# Patient Record
Sex: Female | Born: 1960 | Race: Black or African American | Hispanic: No | Marital: Single | Smoking: Never smoker
Health system: Southern US, Community
[De-identification: ages and names within clinical notes are randomized; demographics above are authoritative.]

## PROBLEM LIST (undated history)

## (undated) DIAGNOSIS — T8859XA Other complications of anesthesia, initial encounter: Secondary | ICD-10-CM

## (undated) DIAGNOSIS — J302 Other seasonal allergic rhinitis: Secondary | ICD-10-CM

## (undated) DIAGNOSIS — Z8719 Personal history of other diseases of the digestive system: Secondary | ICD-10-CM

## (undated) DIAGNOSIS — T4145XA Adverse effect of unspecified anesthetic, initial encounter: Secondary | ICD-10-CM

## (undated) DIAGNOSIS — R011 Cardiac murmur, unspecified: Secondary | ICD-10-CM

## (undated) DIAGNOSIS — J069 Acute upper respiratory infection, unspecified: Secondary | ICD-10-CM

## (undated) DIAGNOSIS — F419 Anxiety disorder, unspecified: Secondary | ICD-10-CM

## (undated) DIAGNOSIS — R197 Diarrhea, unspecified: Secondary | ICD-10-CM

## (undated) DIAGNOSIS — E119 Type 2 diabetes mellitus without complications: Secondary | ICD-10-CM

## (undated) DIAGNOSIS — M4800 Spinal stenosis, site unspecified: Secondary | ICD-10-CM

## (undated) DIAGNOSIS — M1990 Unspecified osteoarthritis, unspecified site: Secondary | ICD-10-CM

## (undated) DIAGNOSIS — J45909 Unspecified asthma, uncomplicated: Secondary | ICD-10-CM

## (undated) DIAGNOSIS — I10 Essential (primary) hypertension: Secondary | ICD-10-CM

## (undated) HISTORY — DX: Other seasonal allergic rhinitis: J30.2

## (undated) HISTORY — DX: Other complications of anesthesia, initial encounter: T88.59XA

## (undated) HISTORY — DX: Personal history of other diseases of the digestive system: Z87.19

## (undated) HISTORY — DX: Acute upper respiratory infection, unspecified: J06.9

## (undated) HISTORY — DX: Spinal stenosis, site unspecified: M48.00

## (undated) HISTORY — DX: Adverse effect of unspecified anesthetic, initial encounter: T41.45XA

## (undated) HISTORY — DX: Diarrhea, unspecified: R19.7

## (undated) HISTORY — DX: Morbid (severe) obesity due to excess calories: E66.01

## (undated) HISTORY — PX: WISDOM TOOTH EXTRACTION: SHX21

## (undated) HISTORY — PX: COLONOSCOPY: SHX174

## (undated) HISTORY — DX: Type 2 diabetes mellitus without complications: E11.9

## (undated) HISTORY — DX: Unspecified asthma, uncomplicated: J45.909

## (undated) HISTORY — DX: Essential (primary) hypertension: I10

---

## 1998-10-13 ENCOUNTER — Other Ambulatory Visit: Admission: RE | Admit: 1998-10-13 | Discharge: 1998-10-13 | Payer: Self-pay | Admitting: Gynecology

## 1999-06-14 ENCOUNTER — Encounter: Payer: Self-pay | Admitting: Gynecology

## 1999-06-14 ENCOUNTER — Ambulatory Visit (HOSPITAL_COMMUNITY): Admission: RE | Admit: 1999-06-14 | Discharge: 1999-06-14 | Payer: Self-pay | Admitting: Gynecology

## 1999-11-09 ENCOUNTER — Other Ambulatory Visit: Admission: RE | Admit: 1999-11-09 | Discharge: 1999-11-09 | Payer: Self-pay | Admitting: Gynecology

## 2000-05-13 HISTORY — PX: OOPHORECTOMY: SHX86

## 2000-05-18 ENCOUNTER — Ambulatory Visit (HOSPITAL_COMMUNITY): Admission: RE | Admit: 2000-05-18 | Discharge: 2000-05-18 | Payer: Self-pay | Admitting: Urology

## 2000-05-18 ENCOUNTER — Encounter: Payer: Self-pay | Admitting: Urology

## 2000-05-29 ENCOUNTER — Emergency Department (HOSPITAL_COMMUNITY): Admission: EM | Admit: 2000-05-29 | Discharge: 2000-05-29 | Payer: Self-pay | Admitting: Emergency Medicine

## 2000-06-05 ENCOUNTER — Inpatient Hospital Stay (HOSPITAL_COMMUNITY): Admission: AD | Admit: 2000-06-05 | Discharge: 2000-06-08 | Payer: Self-pay | Admitting: Gynecology

## 2000-06-05 ENCOUNTER — Encounter (INDEPENDENT_AMBULATORY_CARE_PROVIDER_SITE_OTHER): Payer: Self-pay | Admitting: Specialist

## 2000-07-06 ENCOUNTER — Ambulatory Visit (HOSPITAL_COMMUNITY): Admission: RE | Admit: 2000-07-06 | Discharge: 2000-07-06 | Payer: Self-pay | Admitting: Urology

## 2000-07-06 ENCOUNTER — Encounter: Payer: Self-pay | Admitting: Urology

## 2000-08-30 ENCOUNTER — Encounter: Admission: RE | Admit: 2000-08-30 | Discharge: 2000-11-28 | Payer: Self-pay | Admitting: Family Medicine

## 2001-01-01 ENCOUNTER — Encounter: Admission: RE | Admit: 2001-01-01 | Discharge: 2001-04-01 | Payer: Self-pay | Admitting: Family Medicine

## 2001-01-14 ENCOUNTER — Encounter: Payer: Self-pay | Admitting: General Surgery

## 2001-01-14 ENCOUNTER — Emergency Department (HOSPITAL_COMMUNITY): Admission: EM | Admit: 2001-01-14 | Discharge: 2001-01-14 | Payer: Self-pay | Admitting: Emergency Medicine

## 2001-04-24 ENCOUNTER — Encounter: Admission: RE | Admit: 2001-04-24 | Discharge: 2001-07-23 | Payer: Self-pay | Admitting: Family Medicine

## 2001-06-18 ENCOUNTER — Other Ambulatory Visit: Admission: RE | Admit: 2001-06-18 | Discharge: 2001-06-18 | Payer: Self-pay | Admitting: Gynecology

## 2001-08-08 ENCOUNTER — Encounter: Admission: RE | Admit: 2001-08-08 | Discharge: 2001-11-06 | Payer: Self-pay | Admitting: Family Medicine

## 2001-12-13 ENCOUNTER — Encounter: Admission: RE | Admit: 2001-12-13 | Discharge: 2002-03-13 | Payer: Self-pay | Admitting: Family Medicine

## 2002-03-21 ENCOUNTER — Encounter: Admission: RE | Admit: 2002-03-21 | Discharge: 2002-06-19 | Payer: Self-pay | Admitting: Family Medicine

## 2003-07-24 ENCOUNTER — Other Ambulatory Visit: Admission: RE | Admit: 2003-07-24 | Discharge: 2003-07-24 | Payer: Self-pay | Admitting: Gynecology

## 2004-07-26 ENCOUNTER — Other Ambulatory Visit: Admission: RE | Admit: 2004-07-26 | Discharge: 2004-07-26 | Payer: Self-pay | Admitting: Gynecology

## 2005-08-08 ENCOUNTER — Other Ambulatory Visit: Admission: RE | Admit: 2005-08-08 | Discharge: 2005-08-08 | Payer: Self-pay | Admitting: Gynecology

## 2006-08-11 ENCOUNTER — Other Ambulatory Visit: Admission: RE | Admit: 2006-08-11 | Discharge: 2006-08-11 | Payer: Self-pay | Admitting: Obstetrics and Gynecology

## 2007-09-07 ENCOUNTER — Other Ambulatory Visit: Admission: RE | Admit: 2007-09-07 | Discharge: 2007-09-07 | Payer: Self-pay | Admitting: Obstetrics and Gynecology

## 2009-01-16 ENCOUNTER — Ambulatory Visit: Payer: Self-pay | Admitting: Women's Health

## 2009-03-10 ENCOUNTER — Ambulatory Visit (HOSPITAL_COMMUNITY): Admission: RE | Admit: 2009-03-10 | Discharge: 2009-03-10 | Payer: Self-pay | Admitting: Family Medicine

## 2009-06-25 ENCOUNTER — Ambulatory Visit: Payer: Self-pay | Admitting: Women's Health

## 2010-07-16 ENCOUNTER — Ambulatory Visit (HOSPITAL_COMMUNITY): Admission: RE | Admit: 2010-07-16 | Discharge: 2010-07-16 | Payer: Self-pay | Admitting: Family Medicine

## 2010-11-02 ENCOUNTER — Emergency Department (HOSPITAL_COMMUNITY)
Admission: EM | Admit: 2010-11-02 | Discharge: 2010-11-02 | Disposition: A | Discharge status: Home / Self Care | Payer: Self-pay | Attending: Emergency Medicine | Admitting: Emergency Medicine

## 2010-11-02 ENCOUNTER — Emergency Department (HOSPITAL_COMMUNITY): Payer: Self-pay

## 2010-11-02 DIAGNOSIS — IMO0002 Reserved for concepts with insufficient information to code with codable children: Secondary | ICD-10-CM | POA: Insufficient documentation

## 2010-11-02 DIAGNOSIS — S5000XA Contusion of unspecified elbow, initial encounter: Secondary | ICD-10-CM | POA: Insufficient documentation

## 2011-01-28 NOTE — Discharge Summary (Signed)
[facility]  Patient:    Amy Molina, Amy Molina                       MRN: [id number] Adm. Date:  [id number] Disc. Date: [id number] Attending:  Douglass Rivers Dictator:   Antony Contras, [facility]                           Discharge Summary  DISCHARGE DIAGNOSES:          1. Left lower quadrant pain.                               2. Left hydronephrosis.                               3. Left hydrosalpinx.                               4. Extensive pelvic adhesions.  PROCEDURES:                   Exploratory laparotomy, LSO, and lysis of adhesions.  HISTORY OF PRESENT ILLNESS:   The patient is a 50 year old, gravida 0, with one month of worsening left lower quadrant pain which at times has been incapacitating and has awakened her from sleep.  She has been evaluated for a possible urinary tract infection, kidney infection, stone.  This evaluation was negative, but did reveal a hydronephrosis on her left.  She also, per ultrasound, did have a left ovarian cyst of 3.7 cm noted.  She is admitted for exploratory surgery to rule out a possible left ovarian torsion.  HOSPITAL COURSE:              The patient was taken to the OR, where exploratory laparotomy, LSO, and lysis of adhesions was performed by Dr. Farrel Gobble, assisted by Dr. Penni Homans, under general anesthesia.  The findings included extensive dense left adnexal adhesions, marked induration, scarred ureter, normal uterus, right ovary consistent with PCO.  She also had a left hydrosalpinx.  POSTOPERATIVE COURSE:         The patient did develop a fever on her second postoperative day, up to 101.3.  Otherwise, she was voiding and had no other difficulties other than some hypoactive bowel sounds.  Her temperature did resolve by postoperative day #3, and she did begin to pass gas and had bowel movements prior to discharge.  She was able to be discharged on her second postoperative day, in satisfactory condition.   She  is to follow up in the office in two weeks.  Staples were removed and Steri-Strips applied. DD:  06/27/00 TD:  06/27/00 Job: [id number]

## 2011-01-28 NOTE — Op Note (Signed)
[facility]  Patient:    Amy Molina, Amy Molina                       MRN: [id number] Proc. Date: 06/05/00 Adm. Date:  [id number] Attending:  Douglass Rivers                           Operative Report  PREOPERATIVE DIAGNOSES:       1. Left lower quadrant pain.                               2. Left hydronephrosis.                               3. Possible left ovarian torsion.  POSTOPERATIVE DIAGNOSES:      1. Left lower quadrant pain.                               2. Left hydrosalpinx.                               3. Extensive pelvic adhesions.  OPERATION:                    1. Exploratory laparotomy.                               2. Left salpingo-oophorectomy.                               3. Lysis of adhesions.  SURGEON:                      Douglass Rivers, M.D.  ASSISTANT:                    Katy Fitch, M.D.  ANESTHESIA:                   General anesthesia.  IV FLUID:                     Two liters of lactated Ringers.  ESTIMATED BLOOD LOSS:         300 cc.  URINE OUTPUT:                 250 cc of clear urine.  FINDINGS:                     There were extensive dense left adnexal adhesions with marked induration of the left tube that was thickened hydrosalpinx, noncompressible.  Tube and ovary complex were markedly adherent to the underlying bowel and sidewall and presumable the ureter. The ureter was identified and noted to be thickened and indurated. The uterus was otherwise normal. The right tube was also normal.  The right ovary was consistent with polycystic ovary.  DESCRIPTION OF PROCEDURE:     The patient was taken to the operating room and placed in the supine position.  General anesthesia was induced and she was prepped and draped in the usual sterile fashion.  She was noted to have  a marked pannus and towel clamps were placed and held on tension in order to elevate the pannus.  A Pfannenstiel skin incision was then made with  the scalpel and carried through the underlying layer with electrocautery. The fascia was scored in the midline and the incision was extended laterally with cautery with careful attention of traversing blood vessels. The fascia was then dissected off the underlying rectus muscles by blunt and sharp dissection, again with careful attention to the traversing vessels.  The rectus muscles were separated in the midline. The peritoneum was entered bluntly. The peritoneal incision was then extended superiorly and inferiorly. Good visualization of underlying bowel and bladder.  The pelvis was inspected.  The right tube and ovary were noted to be mobile. The left was markedly adherent. We, therefore, knew we were not dealing with an issue of torsion. The bowels were packed away with moist lap packs.  A Balfour retractor was then placed in the incision with long blade with careful attention to the underlying psoas muscles.  An extender was placed in order to retract the bowels out of the field.  The tubo-ovarian complex was immediately noted and was noted to be markedly adherent to the sidewall and the bowel.  It was dissected off sharply with careful attention to the bowel.  The ovary was densely scarred and difficult to mobilize.  Knowing that the patient had a hydronephrosis on the left, I then decided that the left ovary would probable also need to be sacrificed. The left adnexa was grasped in the [facility] clamp. The round ligament was identified, transected and suture ligated.  The retroperitoneal space was then entered with an attempt to identify the ureter. Despite multiple attempts, we were not able initially at this point to dissect the ureter.  We then went back to sharp and blunt dissection of the tube off the underlying bowel and sidewall.  What appeared to be infundibulopelvic ligament was identified but again the ureter had not been seen and the anatomy was somewhat distorted.  The  posterior leaf of the broad ligament was transected and suture ligated. The pedicle on the uterus was sutured with Heaney stitch of 0 Vicryl. It was noted to be hemostatic.  The ovary was able to be dissected off the tubal complex proximally and this was done in order to help identify the infundibulopelvic ligament.  As we were unable at this point to identify the ureter, the dissection was carried through as close to the tube as possible.  The peritoneum was incised and the vessels were individually identified, clamped and suture ligated with 0 Vicryl until the entire pedicle was identified.  We were eventually able to dissect the mass off the sidewall and colon by blunt and sharp dissection with careful attention to any bleeders.  Once the mass was removed from the abdomen, we then continued to search for the ureter and eventually it was identified and noted to be free of our infundibulopelvic ligament and it was traced to the uterus and was noted to be on the medial aspect of the peritoneum.  The area was again freed up as it had been taken down separately and the peritoneum had been dissected away.  There were small amounts of bleeding. A Kelly clamp was placed underneath this portion and the pedicle was trimmed and a free tie of Vicryl was placed and a suture ligature to follow.  It was noted to be hemostatic at this point.  The pelvis was irrigated with copious  amounts of warm saline.  Hemostasis was assured. We again inspected the ureter.  There was noted to be dense peritoneum over the ureter and this was partially freed up and was noted to be hemostatic.  The instruments were then removed from the abdomen as were the lap packs. The peritoneum and muscle were inspected and treated appropriately with electrocautery. The fascia was then closed with 0 Vicryl starting at the apex and moving toward the midline bilaterally. The subcutaneous area was irrigated and the skin was closed with  staples.  The patient tolerated the procedure well. Sponge, lap and needle counts correct x 2.  She was extubated in the OR and transferred to the PACU in stable condition. DD:  06/05/00 TD:  06/06/00 Job: [id number]

## 2011-07-14 ENCOUNTER — Telehealth: Payer: Self-pay

## 2011-07-14 NOTE — Telephone Encounter (Signed)
Patient requested Diflucan for yeast infection.  I called her back and told her she had not been seen at [facility] in two years and we could not continue to fill RX's without office visit.  Patient said she had lost job and has been going to free pap clinic and went to that back in the Spring in [place].  I asked her to see if she can get her pap results sent to [place].  She said she has been seeing her diabetes doctor but did not feel comfortable seeing him for vaginal yeast infection.  I told her I would ask Harriett Sine.

## 2011-07-14 NOTE — Telephone Encounter (Signed)
Telephone call to [facility]. Reviewed importance of an office visit. She has not been in our office for 2 years. Will schedule

## 2012-02-16 ENCOUNTER — Encounter: Payer: Self-pay | Admitting: Women's Health

## 2012-03-19 ENCOUNTER — Ambulatory Visit (INDEPENDENT_AMBULATORY_CARE_PROVIDER_SITE_OTHER): Payer: 59 | Admitting: Women's Health

## 2012-03-19 ENCOUNTER — Encounter: Payer: Self-pay | Admitting: Women's Health

## 2012-03-19 ENCOUNTER — Other Ambulatory Visit (HOSPITAL_COMMUNITY)
Admission: RE | Admit: 2012-03-19 | Discharge: 2012-03-19 | Disposition: A | Discharge status: Home / Self Care | Payer: 59 | Source: Ambulatory Visit | Attending: Obstetrics and Gynecology | Admitting: Obstetrics and Gynecology

## 2012-03-19 VITALS — BP 120/78 | Ht 65.75 in | Wt 308.0 lb

## 2012-03-19 DIAGNOSIS — I10 Essential (primary) hypertension: Secondary | ICD-10-CM | POA: Insufficient documentation

## 2012-03-19 DIAGNOSIS — E669 Obesity, unspecified: Secondary | ICD-10-CM

## 2012-03-19 DIAGNOSIS — N898 Other specified noninflammatory disorders of vagina: Secondary | ICD-10-CM

## 2012-03-19 DIAGNOSIS — Z01419 Encounter for gynecological examination (general) (routine) without abnormal findings: Secondary | ICD-10-CM

## 2012-03-19 DIAGNOSIS — E119 Type 2 diabetes mellitus without complications: Secondary | ICD-10-CM | POA: Insufficient documentation

## 2012-03-19 LAB — WET PREP FOR TRICH, YEAST, CLUE

## 2012-03-19 MED ORDER — METRONIDAZOLE 0.75 % VA GEL: VAGINAL | Status: AC

## 2012-03-19 MED ORDER — FLUCONAZOLE 100 MG PO TABS: 100.0000 mg | ORAL_TABLET | Freq: Every day | ORAL | Status: AC

## 2012-03-19 MED ORDER — ALPRAZOLAM 0.25 MG PO TABS: 0.2500 mg | ORAL_TABLET | Freq: Every evening | ORAL | Status: AC | PRN

## 2012-03-19 NOTE — Patient Instructions (Signed)

## 2012-03-19 NOTE — Progress Notes (Signed)
Amy Molina Jan 22, 1961 [id number]    History:    The patient presents for annual exam. Cycles every 3 months for past 3 years/5-7 days/same partner. Mammogram and Paps normal, both died in 02/09/2010.  Reports night sweats/hot flashes for past year/vaginal dryness for past 4 months. Complains of recent vaginal itching with no discharge or odor/frequent urination with no dysuria or hematuria that she associates with diuretic.   Past medical history, past surgical history, family history and social history were all reviewed and documented in the EPIC chart. Hx HTN and Type 2 DM-managed by [facility]. Oophorectomy 2001 benign cyst. New job at [employer], upcoming travel to [place].    ROS:  A  ROS was performed and pertinent positives and negatives are included in the history.  Exam:  Filed Vitals:   03/19/12 1015  BP: 120/78    General appearance:  Morbid obesity  Head/Neck:  Normal, without cervical or supraclavicular adenopathy. Thyroid:  Symmetrical, normal in size, without palpable masses or nodularity. Respiratory  Effort:  Normal  Auscultation:  Clear without wheezing or rhonchi Cardiovascular  Auscultation:  Regular rate, without rubs, murmurs or gallops  Edema/varicosities:  Both feet edematous  Abdominal  Soft,nontender, without masses, guarding or rebound.  Liver/spleen:  No organomegaly noted  Hernia:  None appreciated  Skin  Inspection:  Grossly normal  Palpation:  Grossly normal Neurologic/psychiatric  Orientation:  Normal with appropriate conversation.  Mood/affect:  Normal  Genitourinary    Breasts: Examined lying and sitting/pendulous.     Right: Without masses, retractions, discharge or axillary adenopathy.     Left: Without masses, retractions, discharge or axillary adenopathy.   Inguinal/mons:  Normal without inguinal adenopathy  External genitalia:  Erythematous.  BUS/Urethra/Skene's glands:  Normal  Bladder:  Normal  Vagina:  Normal. Wet prep positive for amines, clue  cells, and bacteria, negative for yeast.  Cervix:  Normal  Uterus:  Normal in size, shape and contour.  Midline and mobile  Adnexa/parametria:     Rt: Without masses or tenderness.   Lt: Without masses or tenderness.  Anus and perineum: Normal  Digital rectal exam: Normal sphincter tone without palpated masses or tenderness  Assessment/Plan:  50 y.o. SBF G1P0 for annual exam with complaints of vaginal itching, irregular cycles for past few years.    BV Irregular cycles/every 3 months for past 3 years HTN/Type 2 DM-meds/labs per primary care Morbid Obesity  Plan:  Metrogel vaginal 1 applicator HX X 5, prescription and use reviewed. Diflucan 100 mg one tablet as needed aware to use sparingly.  dispense 30 tablets, no refills prescription and use reviewed/Yeast prevention reviewed. Xanax 0.25 mg daily prn, dispense 30 tablets, prescription as needed for upcoming flight to [place], instructed to keep in safe place and use sparingly. Mammogram overdue, instructed to schedule. UA, Pap done, new screening guidelines reviewed. Ultrasound for irregular cycles and limited exam due to morbid obesity, instructed to schedule, and to notify if no cycle for greater than 3 months. SBE's, Ca rich and heart healthy diet, daily MVI, exercise and cutting calories for weight loss, and yearly mammograms encouraged. Discussed menopause symptoms and definition.    Harrington Challenger WHNP, 11:13 AM 03/19/2012

## 2012-03-20 LAB — URINALYSIS W MICROSCOPIC + REFLEX CULTURE
Bacteria, UA: NONE SEEN
Casts: NONE SEEN
Crystals: NONE SEEN
Ketones, ur: NEGATIVE mg/dL
Leukocytes, UA: NEGATIVE
Nitrite: NEGATIVE
Specific Gravity, Urine: 1.017 (ref 1.005–1.030)
Squamous Epithelial / LPF: NONE SEEN
Urobilinogen, UA: 0.2 mg/dL (ref 0.0–1.0)
pH: 6 (ref 5.0–8.0)

## 2012-04-13 ENCOUNTER — Other Ambulatory Visit: Payer: 59

## 2012-04-13 ENCOUNTER — Ambulatory Visit: Payer: 59 | Admitting: Women's Health

## 2012-06-29 ENCOUNTER — Encounter: Payer: Self-pay | Admitting: Women's Health

## 2013-01-01 ENCOUNTER — Ambulatory Visit (INDEPENDENT_AMBULATORY_CARE_PROVIDER_SITE_OTHER): Payer: 59 | Admitting: Family Medicine

## 2013-01-01 ENCOUNTER — Encounter: Payer: Self-pay | Admitting: Family Medicine

## 2013-01-01 VITALS — BP 122/88 | HR 80 | Temp 97.8°F | Resp 18 | Ht 65.5 in | Wt 313.0 lb

## 2013-01-01 DIAGNOSIS — F411 Generalized anxiety disorder: Secondary | ICD-10-CM

## 2013-01-01 DIAGNOSIS — I10 Essential (primary) hypertension: Secondary | ICD-10-CM

## 2013-01-01 DIAGNOSIS — E119 Type 2 diabetes mellitus without complications: Secondary | ICD-10-CM

## 2013-01-01 LAB — HEPATIC FUNCTION PANEL
ALT: 18 U/L (ref 0–35)
AST: 17 U/L (ref 0–37)
Albumin: 3.6 g/dL (ref 3.5–5.2)
Alkaline Phosphatase: 77 U/L (ref 39–117)
Total Protein: 6.8 g/dL (ref 6.0–8.3)

## 2013-01-01 LAB — BASIC METABOLIC PANEL
BUN: 13 mg/dL (ref 6–23)
CO2: 28 mEq/L (ref 19–32)
Calcium: 9.4 mg/dL (ref 8.4–10.5)
Chloride: 105 mEq/L (ref 96–112)
Creat: 0.59 mg/dL (ref 0.50–1.10)
Glucose, Bld: 156 mg/dL — ABNORMAL HIGH (ref 70–99)

## 2013-01-01 LAB — LIPID PANEL
Cholesterol: 163 mg/dL (ref 0–200)
Total CHOL/HDL Ratio: 3.6 Ratio

## 2013-01-01 MED ORDER — LOSARTAN POTASSIUM 50 MG PO TABS: 50.0000 mg | ORAL_TABLET | Freq: Every day | ORAL | Status: DC

## 2013-01-01 MED ORDER — ALPRAZOLAM 0.5 MG PO TABS: 0.5000 mg | ORAL_TABLET | Freq: Three times a day (TID) | ORAL | Status: DC | PRN

## 2013-01-01 NOTE — Progress Notes (Signed)
  Subjective:    Patient ID: Amy Molina, female    DOB: 02-28-1961, 52 y.o.   MRN: [id number]  HPI  Patient is here for followup of her diabetes. She is turned currently taking glyburide 10 mg by mouth daily and metformin 1000 mg by mouth every morning. She did not increase metformin to twice a day due to diarrhea. She reports fasting blood sugars between 101 120. She denies hypoglycemia. She does report a constant daily call for the last 4 months. She is taking lisinopril 20 mg by mouth daily for blood pressure. She has no chest pain or shortness of breath. She does report occasional wheezing. She's also had problems with episodic anxiety. These include panic attacks. She denies that she needs something on a daily basis for depression. She would like something that she could take sparingly as needed just for anxiety. Past Medical History  Diagnosis Date  . Diabetes mellitus   . Hypertension    Current Outpatient Prescriptions on File Prior to Visit  Medication Sig Dispense Refill  . GLYBURIDE PO Take 10 mg by mouth daily.       Marland Kitchen METFORMIN HCL PO Take 1,000 mg by mouth daily.       . naproxen sodium (ANAPROX) 220 MG tablet Take 220 mg by mouth 2 (two) times daily with a meal. 2 TO 3 PILLS PER DAY       No current facility-administered medications on file prior to visit.   Allergies  Allergen Reactions  . Floxin (Ofloxacin)     MIGRAINES, INSOMNIA, PARANOIND, RINGING IN EARS  . Lipitor (Atorvastatin)     Severe myalgias  . Penicillins      Review of Systems Review of systems is otherwise negative    Objective:   Physical Exam  Neck: Normal range of motion. Neck supple. No thyromegaly present.  Cardiovascular: Normal rate, regular rhythm, normal heart sounds and intact distal pulses.  Exam reveals no gallop.   No murmur heard. Pulmonary/Chest: Effort normal and breath sounds normal. No respiratory distress. She has no wheezes. She has no rales. She exhibits no tenderness.   Abdominal: Soft. Bowel sounds are normal. She exhibits no distension and no mass. There is no tenderness. There is no rebound and no guarding.  Lymphadenopathy:    She has no cervical adenopathy.   patient is obese. There is +1 edema to the shins bilaterally.        Assessment & Plan:  1. Diabetes mellitus type 2, controlled His hemoglobin A1c is under control, I would recommend discontinuing glyburide, and increasing metformin to 1000 twice a day.  I will check her lipid panel. Her goal LDL is less than 100 - Basic Metabolic Panel - Hemoglobin A1c - Hepatic Function Panel - Lipid Panel  2. HTN (hypertension) Blood pressure is currently controlled. Discontinue lisinopril due to cough. Begin losartan 50 mg by mouth daily  3. Generalized anxiety disorder I added Xanax 0.5 mg by mouth every 8 hours when necessary anxiety. I recommended her to use this medication sparingly due to the risk of habituation.

## 2013-01-07 ENCOUNTER — Other Ambulatory Visit: Payer: Self-pay | Admitting: Family Medicine

## 2013-01-07 MED ORDER — METFORMIN HCL 1000 MG PO TABS: 1000.0000 mg | ORAL_TABLET | Freq: Two times a day (BID) | ORAL | Status: DC

## 2013-01-07 NOTE — Telephone Encounter (Signed)
rx per protocol

## 2013-04-01 ENCOUNTER — Other Ambulatory Visit: Payer: Self-pay | Admitting: Family Medicine

## 2013-04-02 NOTE — Telephone Encounter (Signed)
Med refill

## 2013-07-10 ENCOUNTER — Encounter: Payer: Self-pay | Admitting: Women's Health

## 2013-09-04 ENCOUNTER — Ambulatory Visit (INDEPENDENT_AMBULATORY_CARE_PROVIDER_SITE_OTHER): Payer: 59 | Admitting: Women's Health

## 2013-09-04 ENCOUNTER — Encounter: Payer: Self-pay | Admitting: Women's Health

## 2013-09-04 VITALS — BP 115/75 | Ht 66.0 in | Wt 309.4 lb

## 2013-09-04 DIAGNOSIS — Z01419 Encounter for gynecological examination (general) (routine) without abnormal findings: Secondary | ICD-10-CM

## 2013-09-04 DIAGNOSIS — F411 Generalized anxiety disorder: Secondary | ICD-10-CM

## 2013-09-04 MED ORDER — ALPRAZOLAM 0.5 MG PO TABS: 0.5000 mg | ORAL_TABLET | Freq: Three times a day (TID) | ORAL | Status: DC | PRN

## 2013-09-04 NOTE — Patient Instructions (Signed)
Health Recommendations for Postmenopausal Women Respected and ongoing research has looked at the most common causes of death, disability, and poor quality of life in postmenopausal women. The causes include heart disease, diseases of blood vessels, diabetes, depression, cancer, and bone loss (osteoporosis). Many things can be done to help lower the chances of developing these and other common problems: CARDIOVASCULAR DISEASE Heart Disease: A heart attack is a medical emergency. Know the signs and symptoms of a heart attack. Below are things women can do to reduce their risk for heart disease.   Do not smoke. If you smoke, quit.  Aim for a healthy weight. Being overweight causes many preventable deaths. Eat a healthy and balanced diet and drink an adequate amount of liquids.  Get moving. Make a commitment to be more physically active. Aim for 30 minutes of activity on most, if not all days of the week.  Eat for heart health. Choose a diet that is low in saturated fat and cholesterol and eliminate trans fat. Include whole grains, vegetables, and fruits. Read and understand the labels on food containers before buying.  Know your numbers. Ask your caregiver to check your blood pressure, cholesterol (total, HDL, LDL, triglycerides) and blood glucose. Work with your caregiver on improving your entire clinical picture.  High blood pressure. Limit or stop your table salt intake (try salt substitute and food seasonings). Avoid salty foods and drinks. Read labels on food containers before buying. Eating well and exercising can help control high blood pressure. STROKE  Stroke is a medical emergency. Stroke may be the result of a blood clot in a blood vessel in the brain or by a brain hemorrhage (bleeding). Know the signs and symptoms of a stroke. To lower the risk of developing a stroke:  Avoid fatty foods.  Quit smoking.  Control your diabetes, blood pressure, and irregular heart rate. THROMBOPHLEBITIS  (BLOOD CLOT) OF THE LEG  Becoming overweight and leading a stationary lifestyle may also contribute to developing blood clots. Controlling your diet and exercising will help lower the risk of developing blood clots. CANCER SCREENING  Breast Cancer: Take steps to reduce your risk of breast cancer.  You should practice "breast self-awareness." This means understanding the normal appearance and feel of your breasts and should include breast self-examination. Any changes detected, no matter how small, should be reported to your caregiver.  After age 40, you should have a clinical breast exam (CBE) every year.  Starting at age 40, you should consider having a mammogram (breast X-ray) every year.  If you have a family history of breast cancer, talk to your caregiver about genetic screening.  If you are at high risk for breast cancer, talk to your caregiver about having an MRI and a mammogram every year.  Intestinal or Stomach Cancer: Tests to consider are a rectal exam, fecal occult blood, sigmoidoscopy, and colonoscopy. Women who are high risk may need to be screened at an earlier age and more often.  Cervical Cancer:  Beginning at age 30, you should have a Pap test every 3 years as long as the past 3 Pap tests have been normal.  If you have had past treatment for cervical cancer or a condition that could lead to cancer, you need Pap tests and screening for cancer for at least 20 years after your treatment.  If you had a hysterectomy for a problem that was not cancer or a condition that could lead to cancer, then you no longer need Pap tests.    If you are between ages 65 and 70, and you have had normal Pap tests going back 10 years, you no longer need Pap tests.  If Pap tests have been discontinued, risk factors (such as a new sexual partner) need to be reassessed to determine if screening should be resumed.  Some medical problems can increase the chance of getting cervical cancer. In these  cases, your caregiver may recommend more frequent screening and Pap tests.  Uterine Cancer: If you have vaginal bleeding after reaching menopause, you should notify your caregiver.  Ovarian cancer: Other than yearly pelvic exams, there are no reliable tests available to screen for ovarian cancer at this time except for yearly pelvic exams.  Lung Cancer: Yearly chest X-rays can detect lung cancer and should be done on high risk women, such as cigarette smokers and women with chronic lung disease (emphysema).  Skin Cancer: A complete body skin exam should be done at your yearly examination. Avoid overexposure to the sun and ultraviolet light lamps. Use a strong sun block cream when in the sun. All of these things are important in lowering the risk of skin cancer. MENOPAUSE Menopause Symptoms: Hormone therapy products are effective for treating symptoms associated with menopause:  Moderate to severe hot flashes.  Night sweats.  Mood swings.  Headaches.  Tiredness.  Loss of sex drive.  Insomnia.  Other symptoms. Hormone replacement carries certain risks, especially in older women. Women who use or are thinking about using estrogen or estrogen with progestin treatments should discuss that with their caregiver. Your caregiver will help you understand the benefits and risks. The ideal dose of hormone replacement therapy is not known. The Food and Drug Administration (FDA) has concluded that hormone therapy should be used only at the lowest doses and for the shortest amount of time to reach treatment goals.  OSTEOPOROSIS Protecting Against Bone Loss and Preventing Fracture: If you use hormone therapy for prevention of bone loss (osteoporosis), the risks for bone loss must outweigh the risk of the therapy. Ask your caregiver about other medications known to be safe and effective for preventing bone loss and fractures. To guard against bone loss or fractures, the following is recommended:  If  you are less than age 50, take 1000 mg of calcium and at least 600 mg of Vitamin D per day.  If you are greater than age 50 but less than age 70, take 1200 mg of calcium and at least 600 mg of Vitamin D per day.  If you are greater than age 70, take 1200 mg of calcium and at least 800 mg of Vitamin D per day. Smoking and excessive alcohol intake increases the risk of osteoporosis. Eat foods rich in calcium and vitamin D and do weight bearing exercises several times a week as your caregiver suggests. DIABETES Diabetes Melitus: If you have Type I or Type 2 diabetes, you should keep your blood sugar under control with diet, exercise and recommended medication. Avoid too many sweets, starchy and fatty foods. Being overweight can make control more difficult. COGNITION AND MEMORY Cognition and Memory: Menopausal hormone therapy is not recommended for the prevention of cognitive disorders such as Alzheimer's disease or memory loss.  DEPRESSION  Depression may occur at any age, but is common in elderly women. The reasons may be because of physical, medical, social (loneliness), or financial problems and needs. If you are experiencing depression because of medical problems and control of symptoms, talk to your caregiver about this. Physical activity and   exercise may help with mood and sleep. Community and volunteer involvement may help your sense of value and worth. If you have depression and you feel that the problem is getting worse or becoming severe, talk to your caregiver about treatment options that are best for you. ACCIDENTS  Accidents are common and can be serious in the elderly woman. Prepare your house to prevent accidents. Eliminate throw rugs, place hand bars in the bath, shower and toilet areas. Avoid wearing high heeled shoes or walking on wet, snowy, and icy areas. Limit or stop driving if you have vision or hearing problems, or you feel you are unsteady with you movements and  reflexes. HEPATITIS C Hepatitis C is a type of viral infection affecting the liver. It is spread mainly through contact with blood from an infected person. It can be treated, but if left untreated, it can lead to severe liver damage over years. Many people who are infected do not know that the virus is in their blood. If you are a "baby-boomer", it is recommended that you have one screening test for Hepatitis C. IMMUNIZATIONS  Several immunizations are important to consider having during your senior years, including:   Tetanus, diptheria, and pertussis booster shot.  Influenza every year before the flu season begins.  Pneumonia vaccine.  Shingles vaccine.  Others as indicated based on your specific needs. Talk to your caregiver about these. Document Released: 10/21/2005 Document Revised: 08/15/2012 Document Reviewed: 06/16/2008 [employer] Patient Information 2014 [employer].  

## 2013-09-04 NOTE — Progress Notes (Signed)
Amy Molina January 05, 1961 [id number]    History:    The patient presents for annual exam.  Cycles every 30-45 days. LSO 2001 for benign cyst. Same partner/condoms. Normal Pap and mammogram history. History of fibroids/asymptomatic. Hypertension/diabetes primary care manages.    Past medical history, past surgical history, family history and social history were all reviewed and documented in the EPIC chart. Works at [employer], on IT sales professional. Father hypertension heart disease. Sister diabetes. Mother heart disease.  ROS:  A  ROS was performed and pertinent positives and negatives are included in the history.  Exam:  Filed Vitals:   09/04/13 1037  BP: 115/75    General appearance:  Normal Head/Neck:  Normal, without cervical or supraclavicular adenopathy. Thyroid:  Symmetrical, normal in size, without palpable masses or nodularity. Respiratory  Effort:  Normal  Auscultation:  Clear without wheezing or rhonchi Cardiovascular  Auscultation:  Regular rate, without rubs, murmurs or gallops  Edema/varicosities:  Not grossly evident Abdominal  Soft,nontender, without masses, guarding or rebound.  Liver/spleen:  No organomegaly noted  Hernia:  None appreciated  Skin  Inspection:  Grossly normal  Palpation:  Grossly normal Neurologic/psychiatric  Orientation:  Normal with appropriate conversation.  Mood/affect:  Normal  Genitourinary    Breasts: Examined lying and sitting.     Right: Without masses, retractions, discharge or axillary adenopathy.     Left: Without masses, retractions, discharge or axillary adenopathy.   Inguinal/mons:  Normal without inguinal adenopathy  External genitalia:  Normal  BUS/Urethra/Skene's glands:  Normal  Bladder:  Normal  Vagina:  Normal  Cervix:  Normal  Uterus:   normal in size, shape and contour.  Midline and mobile  Adnexa/parametria:     Rt: Without masses or tenderness.   Lt: Without masses or tenderness.  Anus and perineum: Normal  Digital  rectal exam: Normal sphincter tone without palpated masses or tenderness  Assessment/Plan:  52 y.o. SBF G0 for annual exam with occasional moodiness.  Hypertension/diabetes-primary care manages labs and meds Morbid obesity Fibroid uterus  Plan: Ultrasound to assess uterus/limited exam due to  obesity. SBE's, continue annual mammogram, calcium rich diet, vitamin D 2000 daily encouraged. Instructed to schedule screening colonoscopy. Increase regular exercise and decrease calories for weight loss. Pap normal 2013, new screening guidelines reviewed.    [facility], 12:14 PM 09/04/2013

## 2013-09-16 ENCOUNTER — Encounter: Payer: Self-pay | Admitting: Women's Health

## 2013-10-02 ENCOUNTER — Other Ambulatory Visit: Payer: Self-pay

## 2013-10-02 MED ORDER — FLUCONAZOLE 100 MG PO TABS: ORAL_TABLET | ORAL | Status: DC

## 2013-10-08 ENCOUNTER — Telehealth: Payer: Self-pay | Admitting: *Deleted

## 2013-10-08 NOTE — Telephone Encounter (Signed)
PA filled out and faxed back to [facility] for diflucan 100 mg # 30 prn as needed. Will wait for response.

## 2013-10-11 NOTE — Telephone Encounter (Signed)
[facility] denied the below current plan does note cover the amount.

## 2013-10-21 ENCOUNTER — Ambulatory Visit: Payer: 59 | Admitting: Family Medicine

## 2013-10-24 ENCOUNTER — Ambulatory Visit (INDEPENDENT_AMBULATORY_CARE_PROVIDER_SITE_OTHER): Payer: 59 | Admitting: Family Medicine

## 2013-10-24 ENCOUNTER — Encounter: Payer: Self-pay | Admitting: Family Medicine

## 2013-10-24 VITALS — BP 146/92 | HR 80 | Temp 97.4°F | Resp 18 | Ht 65.0 in | Wt 304.0 lb

## 2013-10-24 DIAGNOSIS — IMO0001 Reserved for inherently not codable concepts without codable children: Secondary | ICD-10-CM

## 2013-10-24 DIAGNOSIS — R0602 Shortness of breath: Secondary | ICD-10-CM

## 2013-10-24 DIAGNOSIS — E1165 Type 2 diabetes mellitus with hyperglycemia: Principal | ICD-10-CM

## 2013-10-24 DIAGNOSIS — R0609 Other forms of dyspnea: Secondary | ICD-10-CM

## 2013-10-24 DIAGNOSIS — R0989 Other specified symptoms and signs involving the circulatory and respiratory systems: Secondary | ICD-10-CM

## 2013-10-24 LAB — COMPLETE METABOLIC PANEL WITHOUT GFR
ALT: 13 U/L (ref 0–35)
AST: 18 U/L (ref 0–37)
Albumin: 3.9 g/dL (ref 3.5–5.2)
Alkaline Phosphatase: 85 U/L (ref 39–117)
BUN: 10 mg/dL (ref 6–23)
CO2: 25 meq/L (ref 19–32)
Calcium: 9.3 mg/dL (ref 8.4–10.5)
Chloride: 105 meq/L (ref 96–112)
Creat: 0.57 mg/dL (ref 0.50–1.10)
GFR, Est African American: >89 mL/min
GFR, Est Non African American: >89 mL/min
Glucose, Bld: 99 mg/dL (ref 70–99)
Potassium: 4.7 meq/L (ref 3.5–5.3)
Sodium: 139 meq/L (ref 135–145)
Total Bilirubin: 0.5 mg/dL (ref 0.2–1.2)
Total Protein: 7 g/dL (ref 6.0–8.3)

## 2013-10-24 LAB — CBC WITH DIFFERENTIAL/PLATELET
Basophils Absolute: 0 K/uL (ref 0.0–0.1)
Basophils Relative: 0 % (ref 0–1)
Eosinophils Absolute: 0.7 K/uL (ref 0.0–0.7)
Eosinophils Relative: 6 % — ABNORMAL HIGH (ref 0–5)
HCT: 42 % (ref 36.0–46.0)
Hemoglobin: 13.8 g/dL (ref 12.0–15.0)
Lymphocytes Relative: 41 % (ref 12–46)
Lymphs Abs: 4.8 K/uL — ABNORMAL HIGH (ref 0.7–4.0)
MCH: 30.5 pg (ref 26.0–34.0)
MCHC: 32.9 g/dL (ref 30.0–36.0)
MCV: 92.7 fL (ref 78.0–100.0)
Monocytes Absolute: 1 K/uL (ref 0.1–1.0)
Monocytes Relative: 9 % (ref 3–12)
Neutro Abs: 5.1 K/uL (ref 1.7–7.7)
Neutrophils Relative %: 44 % (ref 43–77)
Platelets: 343 K/uL (ref 150–400)
RBC: 4.53 MIL/uL (ref 3.87–5.11)
RDW: 13.4 % (ref 11.5–15.5)
WBC: 11.6 K/uL — ABNORMAL HIGH (ref 4.0–10.5)

## 2013-10-24 LAB — HEMOGLOBIN A1C
HEMOGLOBIN A1C: 6.5 % — AB (ref <5.7)
MEAN PLASMA GLUCOSE: 140 mg/dL — AB (ref <117)

## 2013-10-24 NOTE — Progress Notes (Signed)
Subjective:    Patient ID: Amy Molina, female    DOB: April 16, 1961, 53 y.o.   MRN: [id number]  HPI Patient was seen in urgent care in October after she developed an upper respiratory infection. She was diagnosed with asthmatic bronchitis and was treated with antibiotics, albuterol, and what sounds like prednisone.  Symptoms improved but never totally subsided. Over the last month she has progressive dyspnea on exertion, wheezing, and shortness of breath. She has a cough that is nonproductive in the morning the cough fades in the evening.  She denies any fevers or chills. She has not had a chest x-ray. She denies any chest pain or chest pressure or angina. She does complain of tightness in her airways.  She has no history of asthma or allergies or tobacco abuse. She denies any angina. She does report orthopnea. She is unable to breathe when she is flat on her back. She has recently gained weight. She does have +1 edema in both legs. She also reports paroxysmal nocturnal dyspnea. Past Medical History  Diagnosis Date  . Diabetes mellitus   . Hypertension    Past Surgical History  Procedure Laterality Date  . Oophorectomy  05-2000    REMOVED LEFT TUBE AND OVARY   Current Outpatient Prescriptions on File Prior to Visit  Medication Sig Dispense Refill  . ALPRAZolam (XANAX) 0.5 MG tablet Take 1 tablet (0.5 mg total) by mouth 3 (three) times daily as needed for sleep or anxiety.  30 tablet  1  . losartan (COZAAR) 50 MG tablet Take 1 tablet (50 mg total) by mouth daily.  90 tablet  3  . naproxen sodium (ANAPROX) 220 MG tablet Take 220 mg by mouth 2 (two) times daily with a meal. 2 TO 3 PILLS PER DAY       No current facility-administered medications on file prior to visit.   Allergies  Allergen Reactions  . Floxin [Ofloxacin]     MIGRAINES, INSOMNIA, PARANOIND, RINGING IN EARS  . Lipitor [Atorvastatin]     Severe myalgias  . Penicillins    History   Social History  . Marital Status:  Single    Spouse Name: N/A    Number of Children: N/A  . Years of Education: N/A   Occupational History  . Not on file.   Social History Main Topics  . Smoking status: Never Smoker   . Smokeless tobacco: Never Used  . Alcohol Use: Yes     Comment: RARELY  . Drug Use: Not on file  . Sexual Activity: Yes    Birth Control/ Protection: None   Other Topics Concern  . Not on file   Social History Narrative  . No narrative on file     Review of Systems  All other systems reviewed and are negative.       Objective:   Physical Exam  Vitals reviewed. Constitutional: She appears well-developed and well-nourished. No distress.  HENT:  Mouth/Throat: Oropharynx is clear and moist.  Eyes: Conjunctivae are normal. No scleral icterus.  Neck: Neck supple. No thyromegaly present.  Cardiovascular: Normal rate, regular rhythm and normal heart sounds.  Exam reveals no gallop and no friction rub.   No murmur heard. Pulmonary/Chest: Effort normal. No respiratory distress. She has no wheezes. She has no rales. She exhibits no tenderness.  Abdominal: Soft. Bowel sounds are normal. She exhibits no distension and no mass. There is no tenderness. There is no rebound and no guarding.  Musculoskeletal: She exhibits edema.  Lymphadenopathy:    She has no cervical adenopathy.  Skin: She is not diaphoretic.          Assessment & Plan:  1. Type II or unspecified type diabetes mellitus without mention of complication, uncontrolled Patient has not been seen on his one year. I doubt that her diabetes is under control. I will check a hemoglobin A1c to assess control her - COMPLETE METABOLIC PANEL WITH GFR - CBC with Differential - Hemoglobin A1c  2. Dyspnea on exertion - Ambulatory referral to Cardiology  3. SOB (shortness of breath) - Ambulatory referral to Cardiology  EKG today in the office shows normal sinus rhythm at 59 beats per minute with normal intervals. She does have a left axis  deviation. She has nonspecific ST changes in leads 23 and aVF. Otherwise there is no evidence of ischemia or infarction. Given her diabetes and her risk factors I will consult cardiology for an echocardiogram as well as a stress test to rule out ischemic cardiomyopathy as a cause of her dyspnea on exertion. Also perform pulmonary function tests today in the office. Her FVC was 2.1 L. Her FEV1 was 1.64 L. FEV1 percent was 78%. This is compatible with mild restriction. This seems to suggest that the patient may have an element of obesity hypoventilation syndrome. I also suspect sleep apnea as a cause of her PND. I am going to try the patient on Symbicort 160/4.5 2 puffs inhaled twice a day and see her back in 2 weeks. If the Symbicort is ineffective and his stress test reveals no ischemia, I will diagnose the patient with obesity hypoventilation syndrome as a cause of her dyspnea.  She is no better in 2 weeks also proceed with a chest x-ray.

## 2013-11-07 ENCOUNTER — Ambulatory Visit: Payer: 59 | Admitting: Family Medicine

## 2013-11-07 ENCOUNTER — Ambulatory Visit: Payer: 59 | Admitting: Cardiology

## 2013-11-07 ENCOUNTER — Ambulatory Visit: Payer: Self-pay | Admitting: Family Medicine

## 2013-11-12 ENCOUNTER — Encounter: Payer: Self-pay | Admitting: Family Medicine

## 2013-11-12 ENCOUNTER — Ambulatory Visit (INDEPENDENT_AMBULATORY_CARE_PROVIDER_SITE_OTHER): Payer: 59 | Admitting: Family Medicine

## 2013-11-12 VITALS — BP 138/80 | HR 68 | Temp 97.6°F | Resp 18 | Ht 66.0 in | Wt 304.0 lb

## 2013-11-12 DIAGNOSIS — R0600 Dyspnea, unspecified: Secondary | ICD-10-CM

## 2013-11-12 DIAGNOSIS — R0609 Other forms of dyspnea: Secondary | ICD-10-CM

## 2013-11-12 DIAGNOSIS — E662 Morbid (severe) obesity with alveolar hypoventilation: Secondary | ICD-10-CM

## 2013-11-12 DIAGNOSIS — R0989 Other specified symptoms and signs involving the circulatory and respiratory systems: Secondary | ICD-10-CM

## 2013-11-12 NOTE — Progress Notes (Signed)
Subjective:    Patient ID: Amy Molina, female    DOB: 1961-03-29, 53 y.o.   MRN: [id number]  HPI 10/24/13 Patient was seen in urgent care in October after she developed an upper respiratory infection. She was diagnosed with asthmatic bronchitis and was treated with antibiotics, albuterol, and what sounds like prednisone.  Symptoms improved but never totally subsided. Over the last month she has progressive dyspnea on exertion, wheezing, and shortness of breath. She has a cough that is nonproductive in the morning the cough fades in the evening.  She denies any fevers or chills. She has not had a chest x-ray. She denies any chest pain or chest pressure or angina. She does complain of tightness in her airways.  She has no history of asthma or allergies or tobacco abuse. She denies any angina. She does report orthopnea. She is unable to breathe when she is flat on her back. She has recently gained weight. She does have +1 edema in both legs. She also reports paroxysmal nocturnal dyspnea.  At that time, my plan was: 1. Type II or unspecified type diabetes mellitus without mention of complication, uncontrolled Patient has not been seen on his one year. I doubt that her diabetes is under control. I will check a hemoglobin A1c to assess control her - COMPLETE METABOLIC PANEL WITH GFR - CBC with Differential - Hemoglobin A1c  2. Dyspnea on exertion - Ambulatory referral to Cardiology  3. SOB (shortness of breath) - Ambulatory referral to Cardiology  EKG today in the office shows normal sinus rhythm at 59 beats per minute with normal intervals. She does have a left axis deviation. She has nonspecific ST changes in leads 23 and aVF. Otherwise there is no evidence of ischemia or infarction. Given her diabetes and her risk factors I will consult cardiology for an echocardiogram as well as a stress test to rule out ischemic cardiomyopathy as a cause of her dyspnea on exertion. Also perform pulmonary  function tests today in the office. Her FVC was 2.1 L. Her FEV1 was 1.64 L. FEV1 percent was 78%. This is compatible with mild restriction. This seems to suggest that the patient may have an element of obesity hypoventilation syndrome. I also suspect sleep apnea as a cause of her PND. I am going to try the patient on Symbicort 160/4.5 2 puffs inhaled twice a day and see her back in 2 weeks. If the Symbicort is ineffective and his stress test reveals no ischemia, I will diagnose the patient with obesity hypoventilation syndrome as a cause of her dyspnea.  She is no better in 2 weeks also proceed with a chest x-ray.  She is doin 50-60% better on Symbicort.  Dyspnea on exertion is much improved.  She has yet to see [facility].  She denies any fever. She denies any coughing. She is no longer wheezing.   Past Medical History  Diagnosis Date  . Diabetes mellitus   . Hypertension    Past Surgical History  Procedure Laterality Date  . Oophorectomy  05-2000    REMOVED LEFT TUBE AND OVARY   Current Outpatient Prescriptions on File Prior to Visit  Medication Sig Dispense Refill  . albuterol (PROVENTIL HFA;VENTOLIN HFA) 108 (90 BASE) MCG/ACT inhaler Inhale 2 puffs into the lungs every 6 (six) hours as needed for wheezing or shortness of breath.      . ALPRAZolam (XANAX) 0.5 MG tablet Take 1 tablet (0.5 mg total) by mouth 3 (three) times daily as needed  for sleep or anxiety.  30 tablet  1  . losartan (COZAAR) 50 MG tablet Take 1 tablet (50 mg total) by mouth daily.  90 tablet  3  . metFORMIN (GLUCOPHAGE) 1000 MG tablet Take 1,000 mg by mouth daily with breakfast.      . naproxen sodium (ANAPROX) 220 MG tablet Take 220 mg by mouth 2 (two) times daily with a meal. 2 TO 3 PILLS PER DAY      . ranitidine (ZANTAC) 150 MG tablet Take 150 mg by mouth 2 (two) times daily.      . fluconazole (DIFLUCAN) 100 MG tablet Take 100 mg by mouth as needed.       . hyoscyamine (LEVSIN, ANASPAZ) 0.125 MG tablet Take 0.125 mg by  mouth every 4 (four) hours as needed.       No current facility-administered medications on file prior to visit.   Allergies  Allergen Reactions  . Floxin [Ofloxacin]     MIGRAINES, INSOMNIA, PARANOIND, RINGING IN EARS  . Lipitor [Atorvastatin]     Severe myalgias  . Other     BIOTIN  . Penicillins    History   Social History  . Marital Status: Single    Spouse Name: N/A    Number of Children: N/A  . Years of Education: N/A   Occupational History  . Not on file.   Social History Main Topics  . Smoking status: Never Smoker   . Smokeless tobacco: Never Used  . Alcohol Use: Yes     Comment: RARELY  . Drug Use: Not on file  . Sexual Activity: Yes    Birth Control/ Protection: None   Other Topics Concern  . Not on file   Social History Narrative  . No narrative on file     Review of Systems  All other systems reviewed and are negative.       Objective:   Physical Exam  Vitals reviewed. Constitutional: She appears well-developed and well-nourished. No distress.  HENT:  Mouth/Throat: Oropharynx is clear and moist.  Eyes: Conjunctivae are normal. No scleral icterus.  Neck: Neck supple. No thyromegaly present.  Cardiovascular: Normal rate, regular rhythm and normal heart sounds.  Exam reveals no gallop and no friction rub.   No murmur heard. Pulmonary/Chest: Effort normal. No respiratory distress. She has no wheezes. She has no rales. She exhibits no tenderness.  Abdominal: Soft. Bowel sounds are normal. She exhibits no distension and no mass. There is no tenderness. There is no rebound and no guarding.  Musculoskeletal: She exhibits edema.  Lymphadenopathy:    She has no cervical adenopathy.  Skin: She is not diaphoretic.          Assessment & Plan:  Dyspnea - Plan: DG Chest 2 View  Obesity hypoventilation syndrome  I believe she has obesity hypoventilation syndrome. Check a chest x-ray. I will also keep sarcoidosis other differential diagnoses. She  can continue to use Symbicort is I believe as a that there is an element of asthma along with her restrictive lung disease.  His chest x-ray shows mediastinal or hilar lymphadenopathy, consider an ACE level.  I will also await the results of her stress test and echocardiogram through cardiology.  I recommended exercise weight loss and dietary changes to help treat her underlying condition.

## 2013-11-18 ENCOUNTER — Telehealth: Payer: Self-pay | Admitting: Family Medicine

## 2013-11-18 ENCOUNTER — Encounter: Payer: Self-pay | Admitting: Family Medicine

## 2013-11-18 NOTE — Telephone Encounter (Signed)
[facility] with medication she is requesting and which pharm.

## 2013-11-18 NOTE — Telephone Encounter (Signed)
Message copied by Alyson Locket on Mon Nov 18, 2013 12:44 PM ------      Message from: Lenore Manner      Created: Mon Nov 18, 2013 11:29 AM      Regarding: RX      Contact: [phone]       Pt is calling in regards to a prescription that was never called into the pharmacy from her apt the other day. s ------

## 2013-11-25 NOTE — Telephone Encounter (Signed)
LMTRC

## 2013-11-26 ENCOUNTER — Encounter: Payer: Self-pay | Admitting: Cardiology

## 2013-11-26 ENCOUNTER — Ambulatory Visit (HOSPITAL_COMMUNITY)
Admission: RE | Admit: 2013-11-26 | Discharge: 2013-11-26 | Disposition: A | Discharge status: Home / Self Care | Payer: 59 | Source: Ambulatory Visit | Attending: Family Medicine | Admitting: Family Medicine

## 2013-11-26 ENCOUNTER — Ambulatory Visit (INDEPENDENT_AMBULATORY_CARE_PROVIDER_SITE_OTHER): Payer: 59 | Admitting: Cardiology

## 2013-11-26 VITALS — BP 144/92 | HR 71 | Ht 65.0 in | Wt 304.0 lb

## 2013-11-26 DIAGNOSIS — R05 Cough: Secondary | ICD-10-CM | POA: Insufficient documentation

## 2013-11-26 DIAGNOSIS — R0789 Other chest pain: Secondary | ICD-10-CM | POA: Insufficient documentation

## 2013-11-26 DIAGNOSIS — R059 Cough, unspecified: Secondary | ICD-10-CM | POA: Insufficient documentation

## 2013-11-26 DIAGNOSIS — I10 Essential (primary) hypertension: Secondary | ICD-10-CM

## 2013-11-26 DIAGNOSIS — R062 Wheezing: Secondary | ICD-10-CM | POA: Insufficient documentation

## 2013-11-26 DIAGNOSIS — R0600 Dyspnea, unspecified: Secondary | ICD-10-CM

## 2013-11-26 DIAGNOSIS — E119 Type 2 diabetes mellitus without complications: Secondary | ICD-10-CM

## 2013-11-26 DIAGNOSIS — R0602 Shortness of breath: Secondary | ICD-10-CM | POA: Insufficient documentation

## 2013-11-26 NOTE — Assessment & Plan Note (Signed)
On Glucophage, followed by Dr. Dennard Schaumann.

## 2013-11-26 NOTE — Patient Instructions (Addendum)
Your physician recommends that you schedule a follow-up appointment to be determined.We will call you with results    Your physician recommends that you continue on your current medications as directed. Please refer to the Current Medication list given to you today.    Your physician has requested that you have an exercise tolerance test. For further information please visit [email or web address]. Please also follow instruction sheet, as given.   Your physician has requested that you have an echocardiogram. Echocardiography is a painless test that uses sound waves to create images of your heart. It provides your doctor with information about the size and shape of your heart and how well your heart's chambers and valves are working. This procedure takes approximately one hour. There are no restrictions for this procedure.    Thank you for choosing [facility] !

## 2013-11-26 NOTE — Assessment & Plan Note (Signed)
Weight loss indicated. Obesity may also be contributing to her shortness of breath.

## 2013-11-26 NOTE — Assessment & Plan Note (Signed)
On Cozaar, followed by Dr. Dennard Schaumann.

## 2013-11-26 NOTE — Assessment & Plan Note (Signed)
Patient states she has improved significantly on Symbicort. Chest x-ray is still pending. She does have personal risk factors for CAD including hypertension and type 2 diabetes mellitus, has never had any prior ischemic testing or structural cardiac testing, now age 53. No significant heart murmur on examination today, however she does report being told this in the past. Plan is to obtain an echocardiogram to assess cardiac structure and function, also a basic GXT for functional capacity and ischemic workup. We will inform her of the results.

## 2013-11-26 NOTE — Progress Notes (Signed)
Clinical Summary Amy Molina is a 53 y.o.female referred for cardiology consultation by Dr. Dennard Schaumann due to shortness of breath. She explains that she had a significant upper after tract infection back in October of last year, had a prolonged time getting over it. 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 Also states that she was "wheezing." She has since improved and is nearly back to baseline. Does not endorse any exertional chest pain or palpitations.  Records reviewed, recent outpatient workup included PFTs showing FVC of 2.1, FEV1 of 1.64 (78%). She was recently placed on Symbicort and has had significant improvement in symptoms. Chest x-ray is pending. There is some concern that she may have obesity hypoventilation syndrome.  Recent ECG showed sinus bradycardia with leftward axis and nonspecific T-wave changes. Recent lab work showed potassium 4.7, BUN 10, creatinine 0.5, normal LFTs, hemoglobin 13.8, platelets 343, hemoglobin A1c 6.5.  She reports no personal history of CAD, cardiac arrhythmia, or congestive heart failure. Does state that she wore a monitor back in her late teens and was also told she had a heart murmur earlier on. She has not had an echocardiogram.   Allergies  Allergen Reactions  . Floxin [Ofloxacin]     MIGRAINES, INSOMNIA, PARANOIND, RINGING IN EARS  . Lipitor [Atorvastatin]     Severe myalgias  . Other     BIOTIN  . Penicillins     Current Outpatient Prescriptions  Medication Sig Dispense Refill  . aspirin 81 MG tablet Take 81 mg by mouth daily.      Marland Kitchen albuterol (PROVENTIL HFA;VENTOLIN HFA) 108 (90 BASE) MCG/ACT inhaler Inhale 2 puffs into the lungs every 6 (six) hours as needed for wheezing or shortness of breath.      . ALPRAZolam (XANAX) 0.5 MG tablet Take 1 tablet (0.5 mg total) by mouth 3 (three) times daily as needed for sleep or anxiety.  30 tablet  1  . fluconazole (DIFLUCAN)  100 MG tablet Take 100 mg by mouth as needed.       Marland Kitchen losartan (COZAAR) 50 MG tablet Take 1 tablet (50 mg total) by mouth daily.  90 tablet  3  . metFORMIN (GLUCOPHAGE) 1000 MG tablet Take 1,000 mg by mouth daily with breakfast.      . naproxen sodium (ANAPROX) 220 MG tablet Take 220 mg by mouth 2 (two) times daily with a meal. 2 TO 3 PILLS PER DAY      . ranitidine (ZANTAC) 150 MG tablet Take 150 mg by mouth 2 (two) times daily.       No current facility-administered medications for this visit.    Past Medical History  Diagnosis Date  . Type 2 diabetes mellitus   . Essential hypertension, benign     Past Surgical History  Procedure Laterality Date  . Oophorectomy  05-2000    REMOVED LEFT TUBE AND OVARY    Family History  Problem Relation Age of Onset  . Heart disease Mother   . Hypertension Father   . Heart disease Father   . Diabetes Sister   . Diabetes Maternal Aunt   . Hypertension Maternal Aunt   . Lung disease Maternal Aunt   . Hypertension Paternal Aunt     Social History Amy Molina reports that she has never smoked. She has never used smokeless tobacco. Amy Molina reports that she drinks alcohol.  Review of Systems No orthopnea or PND.  Stable appetite. No syncope. Otherwise as outlined above.  Physical Examination Filed Vitals:   11/26/13 0910  BP: 144/92  Pulse: 71   Filed Weights   11/26/13 0910  Weight: 304 lb (137.893 kg)   Morbidly obese woman, appears comfortable at rest. HEENT: Conjunctiva and lids normal, oropharynx clear. Neck: Supple, no obvious elevated JVP or carotid bruits, no thyromegaly. Lungs: Decreased breath sounds but no wheezing, nonlabored breathing at rest. Cardiac: Regular rate and rhythm, no S3 or significant systolic murmur, no pericardial rub. Abdomen: Soft, nontender, protuberant, bowel sounds present. Extremities: Trace ankle edema, distal pulses 2+. Skin: Warm and dry. Musculoskeletal: No kyphosis. Neuropsychiatric: Alert  and oriented x3, affect grossly appropriate.   Problem List and Plan   Shortness of breath Patient states she has improved significantly on Symbicort. Chest x-ray is still pending. She does have personal risk factors for CAD including hypertension and type 2 diabetes mellitus, has never had any prior ischemic testing or structural cardiac testing, now age 73. No significant heart murmur on examination today, however she does report being told this in the past. Plan is to obtain an echocardiogram to assess cardiac structure and function, also a basic GXT for functional capacity and ischemic workup. We will inform her of the results.  Obesity, morbid Weight loss indicated. Obesity may also be contributing to her shortness of breath.  Hypertension On Cozaar, followed by Dr. Dennard Schaumann.  Diabetes mellitus, type 2 On Glucophage, followed by Dr. Dennard Schaumann.    Satira Sark, M.D., F.A.C.C.

## 2013-11-27 ENCOUNTER — Other Ambulatory Visit: Payer: Self-pay | Admitting: Family Medicine

## 2013-11-27 MED ORDER — METFORMIN HCL 1000 MG PO TABS: 1000.0000 mg | ORAL_TABLET | Freq: Every day | ORAL | Status: AC

## 2013-11-27 MED ORDER — LOSARTAN POTASSIUM 50 MG PO TABS: 50.0000 mg | ORAL_TABLET | Freq: Every day | ORAL | Status: AC

## 2013-11-27 NOTE — Telephone Encounter (Signed)
No return call 

## 2013-12-02 ENCOUNTER — Encounter (HOSPITAL_COMMUNITY): Payer: 59

## 2013-12-03 ENCOUNTER — Other Ambulatory Visit (HOSPITAL_COMMUNITY): Payer: 59

## 2013-12-12 ENCOUNTER — Encounter (HOSPITAL_COMMUNITY): Payer: 59

## 2013-12-13 ENCOUNTER — Other Ambulatory Visit (HOSPITAL_COMMUNITY): Payer: 59

## 2013-12-17 ENCOUNTER — Encounter: Payer: Self-pay | Admitting: Cardiology

## 2013-12-17 ENCOUNTER — Ambulatory Visit (HOSPITAL_COMMUNITY)
Admission: RE | Admit: 2013-12-17 | Discharge: 2013-12-17 | Disposition: A | Discharge status: Home / Self Care | Payer: 59 | Source: Ambulatory Visit | Attending: Cardiology | Admitting: Cardiology

## 2013-12-17 ENCOUNTER — Encounter (HOSPITAL_COMMUNITY): Payer: 59

## 2013-12-17 DIAGNOSIS — R0602 Shortness of breath: Secondary | ICD-10-CM

## 2013-12-17 DIAGNOSIS — R0989 Other specified symptoms and signs involving the circulatory and respiratory systems: Principal | ICD-10-CM | POA: Insufficient documentation

## 2013-12-17 DIAGNOSIS — R0609 Other forms of dyspnea: Secondary | ICD-10-CM | POA: Insufficient documentation

## 2013-12-17 DIAGNOSIS — I10 Essential (primary) hypertension: Secondary | ICD-10-CM | POA: Insufficient documentation

## 2013-12-17 NOTE — Progress Notes (Signed)
Negative for ischemia and low risk study. Did have hypertensive response so needs to keep followup with primary care for BP control. Await results of echocardiogram.

## 2013-12-17 NOTE — Progress Notes (Signed)
Stress Lab Nurses Notes - Demiana Crumbley  12/17/2013  Reason for doing test: Dyspnea and HTN  Type of test: Regular GTX  Nurse performing test: Gerrit Halls, RN  MD performing test: Myles Gip  Family MD: Pickard  Test explained and consent signed: Yes  IV started: No IV started  Symptoms: SOB  Treatment/Intervention: None  Reason test stopped: Fatigue  After recovery IV was: NA  Patient discharged: Home  Patient's Condition upon discharge was: Stable  Comments: During test peak BP 192/100 & HR 150. Recovery BP 152/90 & HR 98 . Symptoms resolved in recovery.  Geanie Cooley T  Attending note:  Patient exercised on Bruce protocol for 4:26 reaching 7 METS, peak heart rate 151 BPM which was 88% MPHR. Peak blood pressure 192/100. No chest pain reported. No diagnostic ST segment changes to suggest ischemia. Hypertensive response. Low risk Duke treadmill score of 4.5.  Satira Sark, M.D., F.A.C.C.

## 2013-12-17 NOTE — Progress Notes (Signed)
Stress Lab Nurses Notes - Deette Revak 12/17/2013 Reason for doing test: Dyspnea and HTN Type of test: Regular GTX Nurse performing test: Gerrit Halls, RN Nuclear Medicine Tech: Not Applicable Echo Tech: Not Applicable MD performing test: Myles Gip Family MD: Pickard Test explained and consent signed: yes IV started: No IV started Symptoms: SOB Treatment/Intervention: None Reason test stopped: fatigue After recovery IV was: NA Patient to return to [facility]. Med at : NA Patient discharged: Home Patient's Condition upon discharge was: stable Comments: During test peak BP 192/100 & HR 150.  Recovery BP 152/90 & HR 98 .  Symptoms resolved in recovery. Geanie Cooley T

## 2013-12-18 ENCOUNTER — Inpatient Hospital Stay (HOSPITAL_COMMUNITY): Admission: RE | Admit: 2013-12-18 | Payer: 59 | Source: Ambulatory Visit

## 2013-12-18 ENCOUNTER — Other Ambulatory Visit (HOSPITAL_COMMUNITY): Payer: 59

## 2013-12-18 ENCOUNTER — Ambulatory Visit (HOSPITAL_COMMUNITY)
Admission: RE | Admit: 2013-12-18 | Discharge: 2013-12-18 | Disposition: A | Discharge status: Home / Self Care | Payer: 59 | Source: Ambulatory Visit | Attending: Cardiology | Admitting: Cardiology

## 2013-12-18 DIAGNOSIS — R0602 Shortness of breath: Secondary | ICD-10-CM | POA: Insufficient documentation

## 2013-12-18 DIAGNOSIS — E785 Hyperlipidemia, unspecified: Secondary | ICD-10-CM | POA: Insufficient documentation

## 2013-12-18 DIAGNOSIS — I517 Cardiomegaly: Secondary | ICD-10-CM

## 2013-12-18 DIAGNOSIS — I10 Essential (primary) hypertension: Secondary | ICD-10-CM | POA: Insufficient documentation

## 2013-12-18 DIAGNOSIS — Z6841 Body Mass Index (BMI) 40.0 and over, adult: Secondary | ICD-10-CM | POA: Insufficient documentation

## 2013-12-18 NOTE — Progress Notes (Signed)
*  PRELIMINARY RESULTS* Echocardiogram 2D Echocardiogram has been performed.  Amy Molina 12/18/2013, 8:48 AM

## 2013-12-18 NOTE — Progress Notes (Signed)
Made pt aware of GXT results.

## 2013-12-18 NOTE — Progress Notes (Deleted)
   Patient ID: Amy Molina, female    DOB: 03-16-1961, 53 y.o.   MRN: [id number]  HPI    Review of Systems    Physical Exam

## 2013-12-19 ENCOUNTER — Other Ambulatory Visit (HOSPITAL_COMMUNITY): Payer: 59

## 2014-03-22 ENCOUNTER — Other Ambulatory Visit: Payer: Self-pay | Admitting: *Deleted

## 2014-03-22 DIAGNOSIS — I10 Essential (primary) hypertension: Secondary | ICD-10-CM

## 2014-03-22 DIAGNOSIS — E1165 Type 2 diabetes mellitus with hyperglycemia: Principal | ICD-10-CM

## 2014-03-22 DIAGNOSIS — IMO0001 Reserved for inherently not codable concepts without codable children: Secondary | ICD-10-CM

## 2014-04-08 ENCOUNTER — Other Ambulatory Visit: Payer: 59

## 2014-04-19 ENCOUNTER — Emergency Department (HOSPITAL_COMMUNITY)
Admission: EM | Admit: 2014-04-19 | Discharge: 2014-04-20 | Disposition: A | Discharge status: Home / Self Care | Payer: 59 | Attending: Emergency Medicine | Admitting: Emergency Medicine

## 2014-04-19 ENCOUNTER — Encounter (HOSPITAL_COMMUNITY): Payer: Self-pay | Admitting: Emergency Medicine

## 2014-04-19 ENCOUNTER — Emergency Department (HOSPITAL_COMMUNITY): Payer: 59

## 2014-04-19 DIAGNOSIS — H5710 Ocular pain, unspecified eye: Secondary | ICD-10-CM | POA: Diagnosis not present

## 2014-04-19 DIAGNOSIS — Z880 Allergy status to penicillin: Secondary | ICD-10-CM | POA: Insufficient documentation

## 2014-04-19 DIAGNOSIS — Z791 Long term (current) use of non-steroidal anti-inflammatories (NSAID): Secondary | ICD-10-CM | POA: Insufficient documentation

## 2014-04-19 DIAGNOSIS — E119 Type 2 diabetes mellitus without complications: Secondary | ICD-10-CM | POA: Diagnosis not present

## 2014-04-19 DIAGNOSIS — I10 Essential (primary) hypertension: Secondary | ICD-10-CM | POA: Insufficient documentation

## 2014-04-19 DIAGNOSIS — Z7982 Long term (current) use of aspirin: Secondary | ICD-10-CM | POA: Diagnosis not present

## 2014-04-19 DIAGNOSIS — R51 Headache: Secondary | ICD-10-CM | POA: Insufficient documentation

## 2014-04-19 DIAGNOSIS — Z79899 Other long term (current) drug therapy: Secondary | ICD-10-CM | POA: Insufficient documentation

## 2014-04-19 DIAGNOSIS — R519 Headache, unspecified: Secondary | ICD-10-CM

## 2014-04-19 MED ORDER — SODIUM CHLORIDE 0.9 % IV SOLN
INTRAVENOUS | Status: DC
Start: 2014-04-20 — End: 2014-04-20
  Administered 2014-04-20: 1000 mL via INTRAVENOUS

## 2014-04-19 MED ORDER — METOCLOPRAMIDE HCL 5 MG/ML IJ SOLN
10.0000 mg | Freq: Once | INTRAMUSCULAR | Status: AC
  Administered 2014-04-20: 10 mg via INTRAVENOUS
  Filled 2014-04-19: qty 2

## 2014-04-19 MED ORDER — DIPHENHYDRAMINE HCL 50 MG/ML IJ SOLN
25.0000 mg | Freq: Once | INTRAMUSCULAR | Status: AC
  Administered 2014-04-20: 25 mg via INTRAVENOUS
  Filled 2014-04-19: qty 1

## 2014-04-19 NOTE — ED Notes (Signed)
I was at work and felt something pop behind my left eye, now having a lot of pain and pressure in the left side of my head behind my left eye per pt. Denies any visual changes, nausea, or vomiting. States that she has been dizzy.

## 2014-04-19 NOTE — ED Provider Notes (Signed)
CSN: [id number]     Arrival date & time 04/19/14  2051 History   First MD Initiated Contact with Patient 04/19/14 2300   This chart was scribed for Janice Norrie, MD by Rosary Lively, ED scribe. This patient was seen in room APA04/APA04 and the patient's care was started at 11:46 PM.    Chief Complaint  Patient presents with  . Head pain, hurting behind left eye    The history is provided by the patient. No language interpreter was used.   HPI Comments:  Amy Molina is a 53 y.o. female who presents to the Emergency Department complaining of severe pain behind the left eye, onset 7:30 PM when she stood up during the change of shift after working all day. Pt states that she felt a pop, then pains and aches behind the left eye. Pt describes that she felt like it was behind her eye, but reports that she was still able to see. Pt reports that the pain remained on the left side of her face, and that she felt significant pressure. Pt reports that it also feels like something is in her eye, but denies any visual disturbances. Pt also reports that she felt heat on one side of her face, and at the back of her head. Pt also reports it was dificult for her to walk in a line, and that she was staggering to the left after initial eye pain episode. Pt states that she felt achiness in both her hands as well when she got up this morning. Pt denies ever feeling like this. Pt reports that eye pain has decreased. Pt denies h/o headaches. Mother had an episode of bell's palsy 3 years ago. Pt states that mother had to be treated with pills for approximatemyl 6 months. Pt reports that grandmother had cateracts. Pt deneis h/o aneurysm  in family. 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 Pt denies smoking.  Pt rates headache pain as a 5/10, that eases off to a 2, and rises again. States the pain got better after she cried.  She states lights make the  headache feel worse, noise doesn't. She has never had this headache before. She denies nuase or vomiting. Pt sees eye doctor in [place] at [facility].   PCP Dr Dennard Schaumann  Past Medical History  Diagnosis Date  . Type 2 diabetes mellitus   . Essential hypertension, benign    Past Surgical History  Procedure Laterality Date  . Oophorectomy  05-2000    REMOVED LEFT TUBE AND OVARY   Family History  Problem Relation Age of Onset  . Heart disease Mother   . Hypertension Father   . Heart disease Father   . Diabetes Sister   . Diabetes Maternal Aunt   . Hypertension Maternal Aunt   . Lung disease Maternal Aunt   . Hypertension Paternal Aunt    History  Substance Use Topics  . Smoking status: Never Smoker   . Smokeless tobacco: Never Used  . Alcohol Use: Yes     Comment: RARELY   employed  OB History   Grav Para Term Preterm Abortions TAB SAB Ect Mult Living   1    1  1         Review of Systems  Eyes: Positive for pain. Negative for visual disturbance.  Musculoskeletal: Positive for arthralgias, gait problem and myalgias.  Neurological: Positive for headaches.  Allergies  Floxin; Lipitor; Other; and Penicillins  Home Medications   Prior to Admission medications   Medication Sig Start Date End Date Taking? Authorizing Provider  albuterol (PROVENTIL HFA;VENTOLIN HFA) 108 (90 BASE) MCG/ACT inhaler Inhale 2 puffs into the lungs every 6 (six) hours as needed for wheezing or shortness of breath.    Historical Provider, MD  ALPRAZolam Duanne Moron) 0.5 MG tablet Take 1 tablet (0.5 mg total) by mouth 3 (three) times daily as needed for sleep or anxiety. 09/04/13   Huel Cote, NP  aspirin 81 MG tablet Take 81 mg by mouth daily.    Historical Provider, MD  fluconazole (DIFLUCAN) 100 MG tablet Take 100 mg by mouth as needed.     Historical Provider, MD  losartan (COZAAR) 50 MG tablet Take 1 tablet (50 mg total) by mouth daily. 11/27/13   Susy Frizzle, MD  metFORMIN  (GLUCOPHAGE) 1000 MG tablet Take 1 tablet (1,000 mg total) by mouth daily with breakfast. 11/27/13   Susy Frizzle, MD  naproxen sodium (ANAPROX) 220 MG tablet Take 220 mg by mouth 2 (two) times daily with a meal. 2 TO 3 PILLS PER DAY    Historical Provider, MD  ranitidine (ZANTAC) 150 MG tablet Take 150 mg by mouth 2 (two) times daily.    Historical Provider, MD   BP 153/83  Pulse 84  Temp(Src) 98.1 F (36.7 C) (Oral)  Resp 20  Ht 5\' 5"  (1.651 m)  Wt 303 lb (137.44 kg)  BMI 50.42 kg/m2  SpO2 97%  Vital signs normal   Physical Exam  Nursing note and vitals reviewed. Constitutional: She is oriented to person, place, and time. She appears well-developed and well-nourished.  Non-toxic appearance. She does not appear ill. No distress.  HENT:  Head: Normocephalic and atraumatic.  Right Ear: External ear normal.  Left Ear: External ear normal.  Nose: Nose normal. No mucosal edema or rhinorrhea.  Mouth/Throat: Oropharynx is clear and moist and mucous membranes are normal. No dental abscesses or uvula swelling.  Eyes: Conjunctivae and EOM are normal. Pupils are equal, round, and reactive to light.  Mild visual field loss in right upper lateral field otherwise intact, unable to see her disc well in either eye  Neck: Normal range of motion and full passive range of motion without pain. Neck supple.  Nontender, moves head freely  Cardiovascular: Normal rate, regular rhythm and normal heart sounds.  Exam reveals no gallop and no friction rub.   No murmur heard. Pulmonary/Chest: Effort normal and breath sounds normal. No respiratory distress. She has no wheezes. She has no rhonchi. She has no rales. She exhibits no tenderness and no crepitus.  Abdominal: Soft. Normal appearance and bowel sounds are normal. She exhibits no distension. There is no tenderness. There is no rebound and no guarding.  Musculoskeletal: Normal range of motion. She exhibits no edema and no tenderness.  Moves all  extremities well.   Neurological: She is alert and oriented to person, place, and time. She has normal strength. No cranial nerve deficit.  Finger to nose is coordinated bilaterally. Knee to shin is coordinated bilaterally. Grips equal. No facial asymmetry  Skin: Skin is warm, dry and intact. No rash noted. No erythema. No pallor.  Psychiatric: She has a normal mood and affect. Her speech is normal and behavior is normal. Her mood appears not anxious.    ED Course  Procedures   Medications  0.9 %  sodium chloride infusion (1,000 mLs Intravenous New Bag/Given  04/20/14 0023)  metoCLOPramide (REGLAN) injection 10 mg (10 mg Intravenous Given 04/20/14 0027)  diphenhydrAMINE (BENADRYL) injection 25 mg (25 mg Intravenous Given 04/20/14 0024)    DIAGNOSTIC STUDIES: Oxygen Saturation is 97% on RA, adequate by my interpretation.  COORDINATION OF CARE: 12:00 AM -Discussed treatment plan with pt at bedside and pt agreed to plan.  01:30 headache is almost gone. Pt feels back to normal. Visual acuity is better in her affected eye.   00:16:34 Visual Acuity RH Visual Acuity - Bilateral Distance: 20/25 ; R Distance: 20/40 ; L Distance: 20/30   Ct Head Wo Contrast  04/20/2014   CLINICAL DATA:  Headache, felt pop behind left eye  EXAM: CT HEAD WITHOUT CONTRAST  TECHNIQUE: Contiguous axial images were obtained from the base of the skull through the vertex without intravenous contrast.  COMPARISON:  None.  FINDINGS: No mass lesion. No midline shift. No acute hemorrhage or hematoma. No extra-axial fluid collections. No evidence of acute infarction. Calvarium is intact.  IMPRESSION: Negative head CT   Electronically Signed   By: Skipper Cliche M.D.   On: 04/20/2014 00:15     EKG Interpretation None      MDM  PT presents with acute onset of headache with negative CT scan and no FH of aneurysms. She has no neurological deficit except mild loss of visual field in the right eye. Pain improved with migraine  cocktail.    Final diagnoses:  Headache behind the eyes   Plan discharge  Rolland Porter, MD, FACEP     I personally performed the services described in this documentation, which was scribed in my presence. The recorded information has been reviewed and considered.  Rolland Porter, MD, FACEP      Janice Norrie, MD 04/20/14 [phone]

## 2014-04-20 NOTE — ED Notes (Signed)
Pt alert & oriented x4, stable gait. Patient given discharge instructions, paperwork & prescription(s). Patient  instructed to stop at the registration desk to finish any additional paperwork. Patient verbalized understanding. Pt left department w/ no further questions. 

## 2014-04-20 NOTE — Discharge Instructions (Signed)
Return if your vision changes, your headache gets worse, you have uncontrolled nausea or vomiting or you feel worse in any way.  Call Dr Loura Back office (or your eye doctor)  on Monday, August 10th to recheck your left eye. Dr Iona Hansen is a ophthalmologist .

## 2014-06-24 ENCOUNTER — Encounter: Payer: Self-pay | Admitting: Women's Health

## 2014-06-24 ENCOUNTER — Ambulatory Visit (INDEPENDENT_AMBULATORY_CARE_PROVIDER_SITE_OTHER): Payer: 59 | Admitting: Women's Health

## 2014-06-24 DIAGNOSIS — N898 Other specified noninflammatory disorders of vagina: Secondary | ICD-10-CM

## 2014-06-24 DIAGNOSIS — N912 Amenorrhea, unspecified: Secondary | ICD-10-CM

## 2014-06-24 DIAGNOSIS — B3731 Acute candidiasis of vulva and vagina: Secondary | ICD-10-CM

## 2014-06-24 DIAGNOSIS — B373 Candidiasis of vulva and vagina: Secondary | ICD-10-CM

## 2014-06-24 LAB — WET PREP FOR TRICH, YEAST, CLUE
Clue Cells Wet Prep HPF POC: NONE SEEN
Trich, Wet Prep: NONE SEEN

## 2014-06-24 MED ORDER — FLUCONAZOLE 150 MG PO TABS: 150.0000 mg | ORAL_TABLET | Freq: Once | ORAL | Status: DC

## 2014-06-24 NOTE — Patient Instructions (Signed)

## 2014-06-24 NOTE — Progress Notes (Signed)
Patient ID: Amy Molina, female   DOB: June 12, 1961, 53 y.o.   MRN: [id number] Presents with complaint of vaginal itching with minimal discharge and minimal relief with over-the-counter Monistat for 1 week. Had regular monthly cycles until spring 2015, last cycle. Increased hot flushes and vaginal dryness. Denies urinary symptoms, abdominal pain or fever. Type 2 diabetes/hypertension - primary care manages.  Exam: Appears well, obese, external genitalia erythematous at introitus, speculum exam scant white discharge, wet prep positive for yeast.  Yeast vaginitis Perimenopausal  Plan: Diflucan 150 by mouth times one dose with 1 refill, yeast prevention discussed. [place] pending.

## 2014-06-25 LAB — FOLLICLE STIMULATING HORMONE: FSH: 13 m[IU]/mL

## 2014-07-02 ENCOUNTER — Telehealth: Payer: Self-pay | Admitting: *Deleted

## 2014-07-02 DIAGNOSIS — B3731 Acute candidiasis of vulva and vagina: Secondary | ICD-10-CM

## 2014-07-02 DIAGNOSIS — B373 Candidiasis of vulva and vagina: Secondary | ICD-10-CM

## 2014-07-02 MED ORDER — FLUCONAZOLE 150 MG PO TABS: ORAL_TABLET | ORAL | Status: DC

## 2014-07-02 NOTE — Telephone Encounter (Signed)
Pt was seen on 06/24/14 treated with Diflucan 150 # 1 with 1 refill. Pt said she is still having itching and irritation. Pt is requesting another Rx, has not been sexual active. Please advise

## 2014-07-02 NOTE — Telephone Encounter (Signed)
Okay to call and Diflucan 150, repeat in 3 days if needed #2

## 2014-07-02 NOTE — Telephone Encounter (Signed)
rx sent, left on voicemail this has been done 

## 2014-07-14 ENCOUNTER — Encounter: Payer: Self-pay | Admitting: Women's Health

## 2014-07-18 ENCOUNTER — Other Ambulatory Visit: Payer: Self-pay | Admitting: Women's Health

## 2014-07-18 MED ORDER — MEDROXYPROGESTERONE ACETATE 10 MG PO TABS: 10.0000 mg | ORAL_TABLET | Freq: Every day | ORAL | Status: DC

## 2014-07-30 ENCOUNTER — Encounter: Payer: Self-pay | Admitting: Family Medicine

## 2014-09-09 ENCOUNTER — Encounter: Payer: 59 | Admitting: Women's Health

## 2014-09-10 ENCOUNTER — Encounter: Payer: 59 | Admitting: Women's Health

## 2014-10-01 ENCOUNTER — Encounter: Payer: 59 | Admitting: Women's Health

## 2014-10-13 ENCOUNTER — Other Ambulatory Visit (HOSPITAL_COMMUNITY)
Admission: RE | Admit: 2014-10-13 | Discharge: 2014-10-13 | Disposition: A | Discharge status: Home / Self Care | Payer: 59 | Source: Ambulatory Visit | Attending: Women's Health | Admitting: Women's Health

## 2014-10-13 ENCOUNTER — Ambulatory Visit (INDEPENDENT_AMBULATORY_CARE_PROVIDER_SITE_OTHER): Payer: 59 | Admitting: Women's Health

## 2014-10-13 ENCOUNTER — Encounter: Payer: Self-pay | Admitting: Women's Health

## 2014-10-13 VITALS — BP 124/80 | Ht 65.0 in | Wt 308.0 lb

## 2014-10-13 DIAGNOSIS — Z1151 Encounter for screening for human papillomavirus (HPV): Secondary | ICD-10-CM | POA: Insufficient documentation

## 2014-10-13 DIAGNOSIS — B9689 Other specified bacterial agents as the cause of diseases classified elsewhere: Secondary | ICD-10-CM

## 2014-10-13 DIAGNOSIS — A499 Bacterial infection, unspecified: Secondary | ICD-10-CM

## 2014-10-13 DIAGNOSIS — N760 Acute vaginitis: Secondary | ICD-10-CM

## 2014-10-13 DIAGNOSIS — Z01419 Encounter for gynecological examination (general) (routine) without abnormal findings: Secondary | ICD-10-CM | POA: Diagnosis present

## 2014-10-13 DIAGNOSIS — Z113 Encounter for screening for infections with a predominantly sexual mode of transmission: Secondary | ICD-10-CM

## 2014-10-13 LAB — WET PREP FOR TRICH, YEAST, CLUE
Trich, Wet Prep: NONE SEEN
YEAST WET PREP: NONE SEEN

## 2014-10-13 MED ORDER — METRONIDAZOLE 500 MG PO TABS: 500.0000 mg | ORAL_TABLET | Freq: Two times a day (BID) | ORAL | Status: DC

## 2014-10-13 NOTE — Patient Instructions (Signed)

## 2014-10-13 NOTE — Progress Notes (Signed)
Amy Molina 1960-10-10 [id number]    History:    Presents for annual exam.  Monthly cycles/new partner/no contraception. Normal Pap and mammogram history. 2001 LSO for 4 benign ovarian cyst, history of infertility. Use Provera to start cycle 06/2014 FSH 13 and has had monthly cycle since. Diabetes and hypertension managed by primary care. Has not had a colonoscopy. History of fibroids.  Past medical history, past surgical history, family history and social history were all reviewed and documented in the EPIC chart. Works at [employer].  ROS:  A ROS was performed and pertinent positives and negatives are included.  Exam:  Filed Vitals:   10/13/14 1524  BP: 124/80    General appearance:  Normal Thyroid:  Symmetrical, normal in size, without palpable masses or nodularity. Respiratory  Auscultation:  Clear without wheezing or rhonchi Cardiovascular  Auscultation:  Regular rate, without rubs, murmurs or gallops  Edema/varicosities:  Not grossly evident Abdominal  Soft,nontender, without masses, guarding or rebound.  Liver/spleen:  No organomegaly noted  Hernia:  None appreciated  Skin  Inspection:  Grossly normal   Breasts: Examined lying and sitting.     Right: Without masses, retractions, discharge or axillary adenopathy.     Left: Without masses, retractions, discharge or axillary adenopathy. Gentitourinary   Inguinal/mons:  Normal without inguinal adenopathy  External genitalia:  Normal  BUS/Urethra/Skene's glands:  Normal  Vagina:  Moderate brown discharge, wet prep positive for clues, TNTC bacteria  Cervix:  Normal  Uterus:   normal in size, shape and contour.  Midline and mobile  Adnexa/parametria:     Rt: Without masses or tenderness.   Lt: Without masses or tenderness.  Anus and perineum: Normal  Digital rectal exam: Normal sphincter tone without palpated masses or tenderness  Assessment/Plan:  54 y.o. SBF G0 for annual exam with complaint of  discharge.  Monthly cycle/LSO/no contraception STD screen Morbid obesity Hypertension/diabetes-primary care manages meds and labs Fibroids  Plan: SBE's, continue annual screening mammogram, decrease calories and increase exercise for weight loss encouraged. Vitamin D 1000 daily encouraged. Menopause reviewed asymptomatic. Return to office if amenorrheic 3 months to recheck [facility]. Ultrasound increase instructed to schedule. Limited exam due to obesity. Contraception reviewed and declined. Flagyl 500 twice daily for 7 days #14, alcohol precautions reviewed. UA, Pap with HR HPV typing, new screening guidelines reviewed, GC/Chlamydia, HIV, hep B, C, RPRHuel Cote WHNP, 4:02 PM 10/13/2014

## 2014-10-13 NOTE — Addendum Note (Signed)
Addended by: Nelva Nay on: 10/13/2014 04:32 PM   Modules accepted: Orders

## 2014-10-14 LAB — HIV ANTIBODY (ROUTINE TESTING W REFLEX): HIV 1&2 Ab, 4th Generation: NONREACTIVE

## 2014-10-14 LAB — URINALYSIS W MICROSCOPIC + REFLEX CULTURE
Bilirubin Urine: NEGATIVE
CASTS: NONE SEEN
GLUCOSE, UA: NEGATIVE mg/dL
Ketones, ur: NEGATIVE mg/dL
Nitrite: POSITIVE — AB
PH: 7 (ref 5.0–8.0)
Protein, ur: NEGATIVE mg/dL
SQUAMOUS EPITHELIAL / LPF: NONE SEEN
Specific Gravity, Urine: 1.023 (ref 1.005–1.030)
Urobilinogen, UA: 1 mg/dL (ref 0.0–1.0)

## 2014-10-14 LAB — HEPATITIS B SURFACE ANTIGEN: HEP B S AG: NEGATIVE

## 2014-10-14 LAB — GC/CHLAMYDIA PROBE AMP
CT Probe RNA: NEGATIVE
GC Probe RNA: NEGATIVE

## 2014-10-14 LAB — RPR

## 2014-10-14 LAB — HEPATITIS C ANTIBODY: HCV Ab: NEGATIVE

## 2014-10-15 ENCOUNTER — Other Ambulatory Visit: Payer: Self-pay | Admitting: Women's Health

## 2014-10-15 DIAGNOSIS — Z6841 Body Mass Index (BMI) 40.0 and over, adult: Principal | ICD-10-CM

## 2014-10-15 DIAGNOSIS — N912 Amenorrhea, unspecified: Secondary | ICD-10-CM

## 2014-10-15 LAB — CYTOLOGY - PAP

## 2014-10-15 MED ORDER — SULFAMETHOXAZOLE-TRIMETHOPRIM 800-160 MG PO TABS: 1.0000 | ORAL_TABLET | Freq: Two times a day (BID) | ORAL | Status: DC

## 2014-10-16 LAB — URINE CULTURE: Colony Count: >=100000

## 2014-10-22 ENCOUNTER — Other Ambulatory Visit: Payer: Self-pay | Admitting: Women's Health

## 2014-10-22 ENCOUNTER — Encounter: Payer: Self-pay | Admitting: Women's Health

## 2014-10-22 ENCOUNTER — Other Ambulatory Visit: Payer: 59

## 2014-10-22 ENCOUNTER — Ambulatory Visit (INDEPENDENT_AMBULATORY_CARE_PROVIDER_SITE_OTHER): Payer: 59

## 2014-10-22 ENCOUNTER — Ambulatory Visit (INDEPENDENT_AMBULATORY_CARE_PROVIDER_SITE_OTHER): Payer: 59 | Admitting: Women's Health

## 2014-10-22 ENCOUNTER — Ambulatory Visit: Payer: 59 | Admitting: Women's Health

## 2014-10-22 VITALS — Ht 65.0 in | Wt 308.0 lb

## 2014-10-22 DIAGNOSIS — N852 Hypertrophy of uterus: Secondary | ICD-10-CM

## 2014-10-22 DIAGNOSIS — Z6841 Body Mass Index (BMI) 40.0 and over, adult: Secondary | ICD-10-CM

## 2014-10-22 DIAGNOSIS — D252 Subserosal leiomyoma of uterus: Secondary | ICD-10-CM

## 2014-10-22 DIAGNOSIS — N912 Amenorrhea, unspecified: Secondary | ICD-10-CM

## 2014-10-22 DIAGNOSIS — D259 Leiomyoma of uterus, unspecified: Secondary | ICD-10-CM | POA: Insufficient documentation

## 2014-10-22 DIAGNOSIS — L989 Disorder of the skin and subcutaneous tissue, unspecified: Secondary | ICD-10-CM

## 2014-10-22 DIAGNOSIS — E65 Localized adiposity: Secondary | ICD-10-CM

## 2014-10-22 NOTE — Progress Notes (Signed)
Patient ID: Amy Molina, female   DOB: [birth year], 54 y.o.   MRN: [id number] Presents for ultrasound, limited pelvic exam at annual exam, weight greater than 300 pounds. History of fibroids. Postmenopausal/no bleeding/no HRT.   Ultrasound: Transvaginal and transabdominal enlarged anteverted uterus fundal subserous fibroid [phone] cm. Endometrium within normal limits. Right ovary seen transabdominally only. Left adnexa negative history of LSO. No apparent mass right or left adnexal. Negative cul-de-sac.  Fibroid uterus  Plan: Reviewed ultrasound results, large fibroid - asymptomatic, normal right ovary. Will watch at this time.

## 2014-10-22 NOTE — Patient Instructions (Signed)
Fibroids Fibroids are lumps (tumors) that can occur any place in a woman's body. These lumps are not cancerous. Fibroids vary in size, weight, and where they grow. HOME CARE  Do not take aspirin.  Write down the number of pads or tampons you use during your period. Tell your doctor. This can help determine the best treatment for you. GET HELP RIGHT AWAY IF:  You have pain in your lower belly (abdomen) that is not helped with medicine.  You have cramps that are not helped with medicine.  You have more bleeding between or during your period.  You feel lightheaded or pass out (faint).  Your lower belly pain gets worse. MAKE SURE YOU:  Understand these instructions.  Will watch your condition.  Will get help right away if you are not doing well or get worse. Document Released: 10/01/2010 Document Revised: 11/21/2011 Document Reviewed: 10/01/2010 ExitCare Patient Information 2015 [employer]. This information is not intended to replace advice given to you by your health care provider. Make sure you discuss any questions you have with your health care provider.  

## 2014-12-17 ENCOUNTER — Other Ambulatory Visit: Payer: Self-pay | Admitting: Family Medicine

## 2014-12-23 ENCOUNTER — Other Ambulatory Visit: Payer: Self-pay | Admitting: Family Medicine

## 2014-12-23 NOTE — Telephone Encounter (Signed)
Refills denied.   Patient is no longer seen by [facility].

## 2014-12-25 ENCOUNTER — Other Ambulatory Visit: Payer: Self-pay | Admitting: Family Medicine

## 2014-12-25 NOTE — Telephone Encounter (Signed)
Refill refused.   Patient is no longer seen at [facility].

## 2014-12-31 ENCOUNTER — Encounter: Payer: Self-pay | Admitting: Internal Medicine

## 2015-02-12 ENCOUNTER — Telehealth: Payer: Self-pay

## 2015-02-12 ENCOUNTER — Other Ambulatory Visit: Payer: Self-pay

## 2015-02-12 DIAGNOSIS — Z1211 Encounter for screening for malignant neoplasm of colon: Secondary | ICD-10-CM

## 2015-02-12 NOTE — Telephone Encounter (Signed)
Pt rescheduled for colon at [facility] 04/07/15@9 :30am. Please notify her of the change in appointment at previsit.

## 2015-02-12 NOTE — Telephone Encounter (Signed)
Talked with pt she is still 308 so will need to be done at [facility]  Thank you, Angela/previsit

## 2015-02-12 NOTE — Telephone Encounter (Signed)
Left message on pt's voicemail about weight/BMI.  Please contact pt.  Last weight 308 which made BMI 51.25.  Thank you, Namira Rosekrans/previsit

## 2015-02-16 ENCOUNTER — Ambulatory Visit (AMBULATORY_SURGERY_CENTER): Payer: Self-pay

## 2015-02-16 VITALS — Ht 65.5 in | Wt 309.8 lb

## 2015-02-16 DIAGNOSIS — Z83719 Family history of colon polyps, unspecified: Secondary | ICD-10-CM

## 2015-02-16 DIAGNOSIS — Z8371 Family history of colonic polyps: Secondary | ICD-10-CM

## 2015-02-16 MED ORDER — SUPREP BOWEL PREP KIT 17.5-3.13-1.6 GM/177ML PO SOLN: 1.0000 | Freq: Once | ORAL | Status: DC

## 2015-02-16 NOTE — Progress Notes (Signed)
No allergies to eggs or soy No diet/weight loss meds No home oxygen No past problems with anesthesia  Has email  Emmi instructions given for colonoscopy 

## 2015-03-02 ENCOUNTER — Encounter: Payer: 59 | Admitting: Internal Medicine

## 2015-03-10 ENCOUNTER — Telehealth: Payer: Self-pay | Admitting: Gastroenterology

## 2015-03-10 NOTE — Telephone Encounter (Signed)
Per Rosendo Gros pt will need to call and cancel her own TCS.  She is aware that she will need to call and cancel. DS will contact her to triage her for a TCS in July.

## 2015-03-10 NOTE — Telephone Encounter (Signed)
PT WOULD LIKE SCREENING TCS IN . HAS SUPREP.  CANCEL TCS AT [facility] IN JUL 2016 W/ DR. Karsen Fellows. PT PH#: [phone].

## 2015-03-11 NOTE — Telephone Encounter (Signed)
Noted  

## 2015-03-27 NOTE — Telephone Encounter (Signed)
REVIEWED-NO ADDITIONAL RECOMMENDATIONS. 

## 2015-03-27 NOTE — Telephone Encounter (Signed)
I contacted the pt and she had decided to proceed as planned at [facility].

## 2015-04-02 ENCOUNTER — Telehealth: Payer: Self-pay | Admitting: Internal Medicine

## 2015-04-02 NOTE — Telephone Encounter (Signed)
No answer. No voicemail. 

## 2015-04-06 NOTE — Progress Notes (Signed)
04-06-15 1150 Attempts x7 to reach pt, no returned calls. Left message on pt voice mail. Dylan Ruotolo,RN

## 2015-04-07 ENCOUNTER — Encounter (HOSPITAL_COMMUNITY): Payer: Self-pay | Admitting: Anesthesiology

## 2015-04-07 ENCOUNTER — Ambulatory Visit (HOSPITAL_COMMUNITY): Admission: RE | Admit: 2015-04-07 | Payer: 59 | Source: Ambulatory Visit | Admitting: Internal Medicine

## 2015-04-07 ENCOUNTER — Encounter (HOSPITAL_COMMUNITY): Admission: RE | Payer: Self-pay | Source: Ambulatory Visit

## 2015-04-07 SURGERY — COLONOSCOPY
Anesthesia: Monitor Anesthesia Care

## 2015-04-07 MED ORDER — PROPOFOL 10 MG/ML IV BOLUS
INTRAVENOUS | Status: AC
  Filled 2015-04-07: qty 20

## 2015-04-07 MED ORDER — FENTANYL CITRATE (PF) 100 MCG/2ML IJ SOLN
INTRAMUSCULAR | Status: AC
  Filled 2015-04-07: qty 2

## 2015-04-07 NOTE — Anesthesia Preprocedure Evaluation (Deleted)
Anesthesia Evaluation    Airway        Dental   Pulmonary asthma , COPD         Cardiovascular hypertension, Pt. on medications  '15 ECHO: EF 60-65%, valves OK   Neuro/Psych    GI/Hepatic GERD-  Medicated,  Endo/Other  diabetes, Oral Hypoglycemic AgentsMorbid obesity  Renal/GU      Musculoskeletal   Abdominal   Peds  Hematology   Anesthesia Other Findings   Reproductive/Obstetrics                            Anesthesia Physical Anesthesia Plan Anesthesia Quick Evaluation

## 2015-04-08 NOTE — Telephone Encounter (Signed)
Procedure cancelled and recall entered for new colon recall for November. Pt aware.

## 2015-05-04 ENCOUNTER — Telehealth: Payer: Self-pay | Admitting: Internal Medicine

## 2015-05-21 ENCOUNTER — Other Ambulatory Visit: Payer: Self-pay

## 2015-05-21 DIAGNOSIS — Z1211 Encounter for screening for malignant neoplasm of colon: Secondary | ICD-10-CM

## 2015-05-21 NOTE — Telephone Encounter (Signed)
Pt scheduled for previsit 07/10/15@3 :30pm, Colon scheduled at [facility] 08/04/15@11 :15am. Left message for pt to call back.

## 2015-05-22 NOTE — Telephone Encounter (Signed)
Spoke with pt and she is aware of appts. 

## 2015-07-29 ENCOUNTER — Encounter (HOSPITAL_COMMUNITY): Payer: Self-pay | Admitting: *Deleted

## 2015-08-04 ENCOUNTER — Ambulatory Visit (HOSPITAL_COMMUNITY): Payer: 59 | Admitting: Certified Registered Nurse Anesthetist

## 2015-08-04 ENCOUNTER — Encounter (HOSPITAL_COMMUNITY)
Admission: RE | Disposition: A | Discharge status: Home / Self Care | Payer: Self-pay | Source: Ambulatory Visit | Attending: Internal Medicine

## 2015-08-04 ENCOUNTER — Encounter (HOSPITAL_COMMUNITY): Payer: Self-pay

## 2015-08-04 ENCOUNTER — Ambulatory Visit (HOSPITAL_COMMUNITY)
Admission: RE | Admit: 2015-08-04 | Discharge: 2015-08-04 | Disposition: A | Discharge status: Home / Self Care | Payer: 59 | Source: Ambulatory Visit | Attending: Internal Medicine | Admitting: Internal Medicine

## 2015-08-04 DIAGNOSIS — Z7984 Long term (current) use of oral hypoglycemic drugs: Secondary | ICD-10-CM | POA: Diagnosis not present

## 2015-08-04 DIAGNOSIS — K621 Rectal polyp: Secondary | ICD-10-CM | POA: Diagnosis not present

## 2015-08-04 DIAGNOSIS — K5730 Diverticulosis of large intestine without perforation or abscess without bleeding: Secondary | ICD-10-CM | POA: Diagnosis not present

## 2015-08-04 DIAGNOSIS — D128 Benign neoplasm of rectum: Secondary | ICD-10-CM | POA: Insufficient documentation

## 2015-08-04 DIAGNOSIS — Z79899 Other long term (current) drug therapy: Secondary | ICD-10-CM | POA: Insufficient documentation

## 2015-08-04 DIAGNOSIS — I10 Essential (primary) hypertension: Secondary | ICD-10-CM | POA: Insufficient documentation

## 2015-08-04 DIAGNOSIS — M1990 Unspecified osteoarthritis, unspecified site: Secondary | ICD-10-CM | POA: Insufficient documentation

## 2015-08-04 DIAGNOSIS — E119 Type 2 diabetes mellitus without complications: Secondary | ICD-10-CM | POA: Diagnosis not present

## 2015-08-04 DIAGNOSIS — J45909 Unspecified asthma, uncomplicated: Secondary | ICD-10-CM | POA: Diagnosis not present

## 2015-08-04 DIAGNOSIS — Z1211 Encounter for screening for malignant neoplasm of colon: Secondary | ICD-10-CM | POA: Diagnosis not present

## 2015-08-04 DIAGNOSIS — F419 Anxiety disorder, unspecified: Secondary | ICD-10-CM | POA: Diagnosis not present

## 2015-08-04 HISTORY — PX: COLONOSCOPY: SHX5424

## 2015-08-04 HISTORY — DX: Anxiety disorder, unspecified: F41.9

## 2015-08-04 HISTORY — DX: Unspecified osteoarthritis, unspecified site: M19.90

## 2015-08-04 HISTORY — DX: Cardiac murmur, unspecified: R01.1

## 2015-08-04 LAB — GLUCOSE, CAPILLARY: Glucose-Capillary: 94 mg/dL (ref 65–99)

## 2015-08-04 SURGERY — COLONOSCOPY
Anesthesia: Monitor Anesthesia Care

## 2015-08-04 MED ORDER — PROPOFOL 500 MG/50ML IV EMUL
INTRAVENOUS | Status: DC | PRN
  Administered 2015-08-04: 300 ug/kg/min via INTRAVENOUS

## 2015-08-04 MED ORDER — SODIUM CHLORIDE 0.9 % IV SOLN: INTRAVENOUS | Status: DC

## 2015-08-04 MED ORDER — ONDANSETRON HCL 4 MG/2ML IJ SOLN
INTRAMUSCULAR | Status: DC | PRN
  Administered 2015-08-04: 4 mg via INTRAVENOUS

## 2015-08-04 MED ORDER — ONDANSETRON HCL 4 MG/2ML IJ SOLN
INTRAMUSCULAR | Status: AC
  Filled 2015-08-04: qty 2

## 2015-08-04 MED ORDER — LACTATED RINGERS IV SOLN
INTRAVENOUS | Status: DC
  Administered 2015-08-04: 1000 mL via INTRAVENOUS

## 2015-08-04 MED ORDER — PROPOFOL 10 MG/ML IV BOLUS
INTRAVENOUS | Status: AC
  Filled 2015-08-04: qty 20

## 2015-08-04 MED ORDER — PROPOFOL 10 MG/ML IV BOLUS
INTRAVENOUS | Status: AC
  Filled 2015-08-04: qty 40

## 2015-08-04 NOTE — Transfer of Care (Signed)
Immediate Anesthesia Transfer of Care Note  Patient: Amy Molina  Procedure(s) Performed: Procedure(s): COLONOSCOPY (N/A)  Patient Location: PACU  Anesthesia Type:MAC  Level of Consciousness:  sedated, patient cooperative and responds to stimulation  Airway & Oxygen Therapy:Patient Spontanous Breathing and Patient connected to face mask oxgen  Post-op Assessment:  Report given to PACU RN and Post -op Vital signs reviewed and stable  Post vital signs:  Reviewed and stable  Last Vitals:  Filed Vitals:   08/04/15 1038  BP: 140/56  Pulse: 65  Temp: 36.6 C  Resp: 17    Complications: No apparent anesthesia complications

## 2015-08-04 NOTE — H&P (Signed)
HPI: Amy Molina is a 54 year old female with history of morbid obesity, diabetes, hypertension and asthma who presents for outpatient screening colonoscopy. First exam. Denies family history of colorectal cancer in any first-degree relatives. No change in bowel habits. No abdominal pain. Rectal bleeding or melena. No chest pain or dyspnea today.   Past Medical History  Diagnosis Date  . Asthma   . Diabetes mellitus ([place])   . Seasonal allergies   . Morbid obesity ([place])   . Hypertension   . Heart murmur     childhood years- no mention in adult yrs  . Anxiety   . Arthritis     general stiffness of joints    Past Surgical History  Procedure Laterality Date  . Oophorectomy  05-2000    REMOVED LEFT TUBE AND OVARY  . Wisdom tooth extraction       (Not in an outpatient encounter)  Allergies  Allergen Reactions  . Penicillins Swelling    Has patient had a PCN reaction causing immediate rash, facial/tongue/throat swelling, SOB or lightheadedness with hypotension: Yes Has patient had a PCN reaction causing severe rash involving mucus membranes or skin necrosis: No Has patient had a PCN reaction that required hospitalization Yes Has patient had a PCN reaction occurring within the last 10 years: Yes If all of the above answers are "NO", then may proceed with Cephalosporin use.   . Floxin [Ofloxacin]     MIGRAINES, INSOMNIA, PARANOIND, RINGING IN EARS  . Lipitor [Atorvastatin]     Severe myalgias    Family History  Problem Relation Age of Onset  . Heart disease Mother   . Hypertension Father   . Heart disease Father   . Colon polyps Father   . Diabetes Sister   . Diabetes Maternal Aunt     7 mat aunts diabetes  . Hypertension Maternal Aunt   . Lung disease Maternal Aunt   . Hypertension Paternal Aunt   . Colon cancer Neg Hx     Social History  Substance Use Topics  . Smoking status: Never Smoker   . Smokeless tobacco: Never Used  . Alcohol Use: 0.0 oz/week    0  Standard drinks or equivalent per week     Comment: RARELY    ROS: As per history of present illness, otherwise negative  BP 140/56 mmHg  Pulse 65  Temp(Src) 97.8 F (36.6 C) (Oral)  Resp 17  Ht 5' 5.5" (1.664 m)  Wt 309 lb (140.161 kg)  BMI 50.62 kg/m2  SpO2 100%  LMP 01/26/2015 Gen: awake, alert, NAD HEENT: anicteric, op clear CV: RRR, no mrg Pulm: CTA b/l Abd: soft, obese, NT/ND, +BS throughout Ext: no c/c/e Neuro: nonfocal   RELEVANT LABS AND IMAGING: CBC    Component Value Date/Time   WBC 11.6* 10/24/2013 1149   RBC 4.53 10/24/2013 1149   HGB 13.8 10/24/2013 1149   HCT 42.0 10/24/2013 1149   PLT 343 10/24/2013 1149   MCV 92.7 10/24/2013 1149   MCH 30.5 10/24/2013 1149   MCHC 32.9 10/24/2013 1149   RDW 13.4 10/24/2013 1149   LYMPHSABS 4.8* 10/24/2013 1149   MONOABS 1.0 10/24/2013 1149   EOSABS 0.7 10/24/2013 1149   BASOSABS 0.0 10/24/2013 1149    CMP     Component Value Date/Time   NA 139 10/24/2013 1149   K 4.7 10/24/2013 1149   CL 105 10/24/2013 1149   CO2 25 10/24/2013 1149   GLUCOSE 99 10/24/2013 1149   BUN 10 10/24/2013 1149  CREATININE 0.57 10/24/2013 1149   CALCIUM 9.3 10/24/2013 1149   PROT 7.0 10/24/2013 1149   ALBUMIN 3.9 10/24/2013 1149   AST 18 10/24/2013 1149   ALT 13 10/24/2013 1149   ALKPHOS 85 10/24/2013 1149   BILITOT 0.5 10/24/2013 1149   GFRNONAA >89 10/24/2013 1149   GFRAA >89 10/24/2013 1149    ASSESSMENT/PLAN:  54 year old female with history of morbid obesity, diabetes, hypertension and asthma who presents for outpatient screening colonoscopy.   1. CRC screening -- average risk, first examination. The nature of the procedure, as well as the risks, benefits, and alternatives were carefully and thoroughly reviewed with the patient. Ample time for discussion and questions allowed. The patient understood, was satisfied, and agreed to proceed.

## 2015-08-04 NOTE — Discharge Instructions (Addendum)
YOU HAD AN ENDOSCOPIC PROCEDURE TODAY: Refer to the procedure report that was given to you for any specific questions about what was found during the examination.  If the procedure report does not answer your questions, please call your gastroenterologist to clarify.  YOU SHOULD EXPECT: Some feelings of bloating in the abdomen. Passage of more gas than usual.  Walking can help get rid of the air that was put into your GI tract during the procedure and reduce the bloating. If you had a lower endoscopy (such as a colonoscopy or flexible sigmoidoscopy) you may notice spotting of blood in your stool or on the toilet paper.   DIET: Your first meal following the procedure should be a light meal and then it is ok to progress to your normal diet.  A half-sandwich or bowl of soup is an example of a good first meal.  Heavy or fried foods are harder to digest and may make you feel nasueas or bloated.  Drink plenty of fluids but you should avoid alcoholic beverages for 24 hours.  ACTIVITY: Your care partner should take you home directly after the procedure.  You should plan to take it easy, moving slowly for the rest of the day.  You can resume normal activity the day after the procedure however you should NOT DRIVE or use heavy machinery for 24 hours (because of the sedation medicines used during the test).    SYMPTOMS TO REPORT IMMEDIATELY  A gastroenterologist can be reached at any hour.  Please call your doctor's office for any of the following symptoms:   Following lower endoscopy (colonoscopy, flexible sigmoidoscopy)  Excessive amounts of blood in the stool  Significant tenderness, worsening of abdominal pains  Swelling of the abdomen that is new, acute  Fever of 100 or higher  Following upper endoscopy (EGD, EUS, ERCP)  Vomiting of blood or coffee ground material  New, significant abdominal pain  New, significant chest pain or pain under the shoulder blades  Painful or persistently difficult  swallowing  New shortness of breath  Black, tarry-looking stools  FOLLOW UP: If any biopsies were taken you will be contacted by phone or by letter within the next 1-3 weeks.  Call your gastroenterologist if you have not heard about the biopsies in 3 weeks.  Please also call your gastroenterologist's office with any specific questions about appointments or follow up tests.   Moderate Conscious Sedation, Adult, Care After Refer to this sheet in the next few weeks. These instructions provide you with information on caring for yourself after your procedure. Your health care provider may also give you more specific instructions. Your treatment has been planned according to current medical practices, but problems sometimes occur. Call your health care provider if you have any problems or questions after your procedure. WHAT TO EXPECT AFTER THE PROCEDURE  After your procedure:  You may feel sleepy, clumsy, and have poor balance for several hours.  Vomiting may occur if you eat too soon after the procedure. HOME CARE INSTRUCTIONS  Do not participate in any activities where you could become injured for at least 24 hours. Do not:  Drive.  Swim.  Ride a bicycle.  Operate heavy machinery.  Cook.  Use power tools.  Climb ladders.  Work from a high place.  Do not make important decisions or sign legal documents until you are improved.  If you vomit, drink water, juice, or soup when you can drink without vomiting. Make sure you have little or no nausea  before eating solid foods.  Only take over-the-counter or prescription medicines for pain, discomfort, or fever as directed by your health care provider.  Make sure you and your family fully understand everything about the medicines given to you, including what side effects may occur.  You should not drink alcohol, take sleeping pills, or take medicines that cause drowsiness for at least 24 hours.  If you smoke, do not smoke without  supervision.  If you are feeling better, you may resume normal activities 24 hours after you were sedated.  Keep all appointments with your health care provider. SEEK MEDICAL CARE IF:  Your skin is pale or bluish in color.  You continue to feel nauseous or vomit.  Your pain is getting worse and is not helped by medicine.  You have bleeding or swelling.  You are still sleepy or feeling clumsy after 24 hours. SEEK IMMEDIATE MEDICAL CARE IF:  You develop a rash.  You have difficulty breathing.  You develop any type of allergic problem.  You have a fever. MAKE SURE YOU:  Understand these instructions.  Will watch your condition.  Will get help right away if you are not doing well or get worse.   This information is not intended to replace advice given to you by your health care provider. Make sure you discuss any questions you have with your health care provider.   Document Released: 06/19/2013 Document Revised: 09/19/2014 Document Reviewed: 06/19/2013 Elsevier Interactive Patient Education [employer].

## 2015-08-04 NOTE — Op Note (Signed)
[facility] [place], [id number]   COLONOSCOPY PROCEDURE REPORT  PATIENT: Amy Molina, Amy Molina  MR#: [id number] BIRTHDATE: 08-11-1961 , 53  yrs. old GENDER: female ENDOSCOPIST: Jerene Bears, MD REFERRED OX:[id number] Dennard Schaumann, M.D. PROCEDURE DATE:  08/04/2015 PROCEDURE:   Colonoscopy, screening and Colonoscopy with snare polypectomy First Screening Colonoscopy - Avg.  risk and is 50 yrs.  old or older Yes.  Prior Negative Screening - Now for repeat screening. N/A  History of Adenoma - Now for follow-up colonoscopy & has been > or = to 3 yrs.  N/A  Polyps removed today? Yes ASA CLASS:   Class III INDICATIONS:Screening for colonic neoplasia and Colorectal Neoplasm Risk Assessment for this procedure is average risk. MEDICATIONS: Monitored anesthesia care and Per Anesthesia  DESCRIPTION OF PROCEDURE:   After the risks benefits and alternatives of the procedure were thoroughly explained, informed consent was obtained.  The digital rectal exam revealed no rectal mass.   The [id number]  endoscope was introduced through the anus and advanced to the cecum, which was identified by both the appendix and ileocecal valve. No adverse events experienced. The quality of the prep was good.  (Suprep was used)  The instrument was then slowly withdrawn as the colon was fully examined. Estimated blood loss is zero unless otherwise noted in this procedure report.   COLON FINDINGS: A sessile polyp measuring 5 mm in size was found in the rectum.  A polypectomy was performed with a cold snare.  The resection was complete, the polyp tissue was completely retrieved and sent to histology.   There was mild diverticulosis noted in the left colon.  Retroflexed views revealed no abnormalities. The time to cecum = 1.2 Withdrawal time = 12.0   The scope was withdrawn and the procedure completed. COMPLICATIONS: There were no immediate complications.  ENDOSCOPIC IMPRESSION: 1.    Sessile polyp was found in the rectum; polypectomy was performed with a cold snare 2.   There was mild diverticulosis noted in the left colon  RECOMMENDATIONS: 1.  Await pathology results 2.  High fiber diet 3.  If the polyp removed today is proven to be an adenomatous (pre-cancerous) polyp, you will need a repeat colonoscopy in 5 years.  Otherwise you should continue to follow colorectal cancer screening guidelines for "routine risk" patients with colonoscopy in 10 years.  You will receive a letter within 1-2 weeks with the results of your biopsy as well as final recommendations.  Please call my office if you have not received a letter after 3 weeks.  eSigned:  Jerene Bears, MD 08/04/2015 11:58 AM   cc:  the patient, Dr. Dennard Schaumann

## 2015-08-04 NOTE — Anesthesia Preprocedure Evaluation (Addendum)
Anesthesia Evaluation  Patient identified by MRN, date of birth, ID band Patient awake    Reviewed: Allergy & Precautions, NPO status , Patient's Chart, lab work & pertinent test results  Airway Mallampati: III  TM Distance: >3 FB Neck ROM: Full    Dental  (+) Teeth Intact, Dental Advisory Given   Pulmonary shortness of breath and with exertion, asthma ,    Pulmonary exam normal breath sounds clear to auscultation       Cardiovascular hypertension, Pt. on medications (-) angina(-) CAD and (-) Past MI Normal cardiovascular exam Rhythm:Regular Rate:Normal     Neuro/Psych PSYCHIATRIC DISORDERS Anxiety negative neurological ROS     GI/Hepatic Neg liver ROS, GERD  Medicated,  Endo/Other  diabetes, Type 2, Oral Hypoglycemic AgentsMorbid obesity  Renal/GU negative Renal ROS     Musculoskeletal  (+) Arthritis , Osteoarthritis,    Abdominal   Peds  Hematology negative hematology ROS (+)   Anesthesia Other Findings Day of surgery medications reviewed with the patient.  Finished bowel prep at 0930.  Will wait until 1130 before procedure.  Reproductive/Obstetrics                           Anesthesia Physical Anesthesia Plan  ASA: III  Anesthesia Plan: MAC   Post-op Pain Management:    Induction: Intravenous  Airway Management Planned: Nasal Cannula  Additional Equipment:   Intra-op Plan:   Post-operative Plan:   Informed Consent: I have reviewed the patients History and Physical, chart, labs and discussed the procedure including the risks, benefits and alternatives for the proposed anesthesia with the patient or authorized representative who has indicated his/her understanding and acceptance.   Dental advisory given  Plan Discussed with: CRNA and Anesthesiologist  Anesthesia Plan Comments: (Discussed risks/benefits/alternatives to MAC sedation including need for ventilatory support,  hypotension, need for conversion to general anesthesia.  All patient questions answered.  Patient wished to proceed.)        Anesthesia Quick Evaluation

## 2015-08-05 ENCOUNTER — Encounter (HOSPITAL_COMMUNITY): Payer: Self-pay | Admitting: Internal Medicine

## 2015-08-09 NOTE — Anesthesia Postprocedure Evaluation (Signed)
Anesthesia Post Note  Patient: Amy Molina  Procedure(s) Performed: Procedure(s) (LRB): COLONOSCOPY (N/A)  Patient location during evaluation: PACU Anesthesia Type: MAC Level of consciousness: awake and alert Pain management: pain level controlled Vital Signs Assessment: post-procedure vital signs reviewed and stable Respiratory status: spontaneous breathing, nonlabored ventilation, respiratory function stable and patient connected to nasal cannula oxygen Cardiovascular status: stable and blood pressure returned to baseline Anesthetic complications: no    Last Vitals:  Filed Vitals:   08/04/15 1210 08/04/15 1220  BP: 114/40 122/73  Pulse: 90 91  Temp:    Resp: 16 25    Last Pain: There were no vitals filed for this visit.               Catalina Gravel

## 2015-08-10 ENCOUNTER — Encounter: Payer: Self-pay | Admitting: Internal Medicine

## 2016-07-27 ENCOUNTER — Other Ambulatory Visit: Payer: Self-pay | Admitting: Women's Health

## 2016-07-27 DIAGNOSIS — Z1231 Encounter for screening mammogram for malignant neoplasm of breast: Secondary | ICD-10-CM

## 2016-08-10 ENCOUNTER — Ambulatory Visit (HOSPITAL_COMMUNITY)
Admission: RE | Admit: 2016-08-10 | Discharge: 2016-08-10 | Disposition: A | Discharge status: Home / Self Care | Payer: 59 | Source: Ambulatory Visit | Attending: Women's Health | Admitting: Women's Health

## 2016-08-10 DIAGNOSIS — Z1231 Encounter for screening mammogram for malignant neoplasm of breast: Secondary | ICD-10-CM | POA: Diagnosis not present

## 2017-07-21 ENCOUNTER — Encounter (HOSPITAL_COMMUNITY): Payer: Self-pay | Admitting: Emergency Medicine

## 2017-07-21 ENCOUNTER — Other Ambulatory Visit: Payer: Self-pay

## 2017-07-21 ENCOUNTER — Emergency Department (HOSPITAL_COMMUNITY): Payer: 59

## 2017-07-21 ENCOUNTER — Inpatient Hospital Stay (HOSPITAL_COMMUNITY)
Admission: EM | Admit: 2017-07-21 | Discharge: 2017-07-26 | DRG: 758 | Disposition: A | Discharge status: Home / Self Care | Payer: 59 | Attending: Internal Medicine | Admitting: Internal Medicine

## 2017-07-21 DIAGNOSIS — Z9079 Acquired absence of other genital organ(s): Secondary | ICD-10-CM | POA: Diagnosis not present

## 2017-07-21 DIAGNOSIS — R109 Unspecified abdominal pain: Secondary | ICD-10-CM | POA: Diagnosis present

## 2017-07-21 DIAGNOSIS — Z780 Asymptomatic menopausal state: Secondary | ICD-10-CM

## 2017-07-21 DIAGNOSIS — E282 Polycystic ovarian syndrome: Secondary | ICD-10-CM | POA: Diagnosis present

## 2017-07-21 DIAGNOSIS — Z6841 Body Mass Index (BMI) 40.0 and over, adult: Secondary | ICD-10-CM

## 2017-07-21 DIAGNOSIS — I10 Essential (primary) hypertension: Secondary | ICD-10-CM | POA: Diagnosis present

## 2017-07-21 DIAGNOSIS — N7093 Salpingitis and oophoritis, unspecified: Secondary | ICD-10-CM | POA: Diagnosis present

## 2017-07-21 DIAGNOSIS — Z79899 Other long term (current) drug therapy: Secondary | ICD-10-CM | POA: Diagnosis not present

## 2017-07-21 DIAGNOSIS — B961 Klebsiella pneumoniae [K. pneumoniae] as the cause of diseases classified elsewhere: Secondary | ICD-10-CM | POA: Diagnosis present

## 2017-07-21 DIAGNOSIS — Z833 Family history of diabetes mellitus: Secondary | ICD-10-CM | POA: Diagnosis not present

## 2017-07-21 DIAGNOSIS — N390 Urinary tract infection, site not specified: Secondary | ICD-10-CM | POA: Diagnosis present

## 2017-07-21 DIAGNOSIS — Z90721 Acquired absence of ovaries, unilateral: Secondary | ICD-10-CM

## 2017-07-21 DIAGNOSIS — Z888 Allergy status to other drugs, medicaments and biological substances status: Secondary | ICD-10-CM

## 2017-07-21 DIAGNOSIS — Z7984 Long term (current) use of oral hypoglycemic drugs: Secondary | ICD-10-CM

## 2017-07-21 DIAGNOSIS — Z8249 Family history of ischemic heart disease and other diseases of the circulatory system: Secondary | ICD-10-CM | POA: Diagnosis not present

## 2017-07-21 DIAGNOSIS — J302 Other seasonal allergic rhinitis: Secondary | ICD-10-CM | POA: Diagnosis present

## 2017-07-21 DIAGNOSIS — Z881 Allergy status to other antibiotic agents status: Secondary | ICD-10-CM | POA: Diagnosis not present

## 2017-07-21 DIAGNOSIS — Z7982 Long term (current) use of aspirin: Secondary | ICD-10-CM

## 2017-07-21 DIAGNOSIS — D259 Leiomyoma of uterus, unspecified: Secondary | ICD-10-CM | POA: Diagnosis present

## 2017-07-21 DIAGNOSIS — Z880 Allergy status to penicillin: Secondary | ICD-10-CM | POA: Diagnosis not present

## 2017-07-21 DIAGNOSIS — R1031 Right lower quadrant pain: Secondary | ICD-10-CM | POA: Insufficient documentation

## 2017-07-21 DIAGNOSIS — E1165 Type 2 diabetes mellitus with hyperglycemia: Secondary | ICD-10-CM | POA: Diagnosis present

## 2017-07-21 DIAGNOSIS — F419 Anxiety disorder, unspecified: Secondary | ICD-10-CM | POA: Diagnosis present

## 2017-07-21 LAB — CBC WITH DIFFERENTIAL/PLATELET
BASOS ABS: 0 10*3/uL (ref 0.0–0.1)
Basophils Relative: 0 %
EOS PCT: 0 %
Eosinophils Absolute: 0 10*3/uL (ref 0.0–0.7)
HEMATOCRIT: 44.2 % (ref 36.0–46.0)
Hemoglobin: 14.3 g/dL (ref 12.0–15.0)
LYMPHS PCT: 9 %
Lymphs Abs: 2.3 10*3/uL (ref 0.7–4.0)
MCH: 31.5 pg (ref 26.0–34.0)
MCHC: 32.4 g/dL (ref 30.0–36.0)
MCV: 97.4 fL (ref 78.0–100.0)
MONO ABS: 1.5 10*3/uL — AB (ref 0.1–1.0)
Monocytes Relative: 6 %
Neutro Abs: 22.8 10*3/uL — ABNORMAL HIGH (ref 1.7–7.7)
Neutrophils Relative %: 86 %
Platelets: 222 10*3/uL (ref 150–400)
RBC: 4.54 MIL/uL (ref 3.87–5.11)
RDW: 13.4 % (ref 11.5–15.5)
WBC: 26.7 10*3/uL — ABNORMAL HIGH (ref 4.0–10.5)

## 2017-07-21 LAB — PREGNANCY, URINE: Preg Test, Ur: NEGATIVE

## 2017-07-21 LAB — GLUCOSE, CAPILLARY
GLUCOSE-CAPILLARY: 168 mg/dL — AB (ref 65–99)
Glucose-Capillary: 172 mg/dL — ABNORMAL HIGH (ref 65–99)

## 2017-07-21 LAB — COMPREHENSIVE METABOLIC PANEL
ALBUMIN: 3.3 g/dL — AB (ref 3.5–5.0)
ALT: 24 U/L (ref 14–54)
ANION GAP: 9 (ref 5–15)
AST: 20 U/L (ref 15–41)
Alkaline Phosphatase: 94 U/L (ref 38–126)
BILIRUBIN TOTAL: 2.1 mg/dL — AB (ref 0.3–1.2)
BUN: 9 mg/dL (ref 6–20)
CO2: 27 mmol/L (ref 22–32)
Calcium: 8.6 mg/dL — ABNORMAL LOW (ref 8.9–10.3)
Chloride: 99 mmol/L — ABNORMAL LOW (ref 101–111)
Creatinine, Ser: 0.78 mg/dL (ref 0.44–1.00)
Glucose, Bld: 230 mg/dL — ABNORMAL HIGH (ref 65–99)
POTASSIUM: 3.8 mmol/L (ref 3.5–5.1)
Sodium: 135 mmol/L (ref 135–145)
TOTAL PROTEIN: 7.6 g/dL (ref 6.5–8.1)

## 2017-07-21 LAB — URINALYSIS, ROUTINE W REFLEX MICROSCOPIC
BILIRUBIN URINE: NEGATIVE
GLUCOSE, UA: NEGATIVE mg/dL
HGB URINE DIPSTICK: NEGATIVE
KETONES UR: 5 mg/dL — AB
LEUKOCYTES UA: NEGATIVE
NITRITE: NEGATIVE
PH: 5 (ref 5.0–8.0)
Protein, ur: 30 mg/dL — AB
SPECIFIC GRAVITY, URINE: 1.024 (ref 1.005–1.030)

## 2017-07-21 LAB — WET PREP, GENITAL
SPERM: NONE SEEN
Trich, Wet Prep: NONE SEEN
YEAST WET PREP: NONE SEEN

## 2017-07-21 LAB — LIPASE, BLOOD: LIPASE: 31 U/L (ref 11–51)

## 2017-07-21 LAB — LACTIC ACID, PLASMA: LACTIC ACID, VENOUS: 1.6 mmol/L (ref 0.5–1.9)

## 2017-07-21 MED ORDER — HYDROMORPHONE HCL 2 MG/ML IJ SOLN
INTRAMUSCULAR | Status: AC
  Filled 2017-07-21: qty 1

## 2017-07-21 MED ORDER — ASPIRIN EC 81 MG PO TBEC
81.0000 mg | DELAYED_RELEASE_TABLET | Freq: Every day | ORAL | Status: DC
  Administered 2017-07-21 – 2017-07-25 (×5): 81 mg via ORAL
  Filled 2017-07-21 (×5): qty 1

## 2017-07-21 MED ORDER — DOXYCYCLINE HYCLATE 100 MG PO TABS
100.0000 mg | ORAL_TABLET | Freq: Once | ORAL | Status: AC
  Administered 2017-07-21: 100 mg via ORAL
  Filled 2017-07-21: qty 1

## 2017-07-21 MED ORDER — ADULT MULTIVITAMIN W/MINERALS CH
1.0000 | ORAL_TABLET | Freq: Every morning | ORAL | Status: DC
  Administered 2017-07-22 – 2017-07-26 (×5): 1 via ORAL
  Filled 2017-07-21 (×5): qty 1

## 2017-07-21 MED ORDER — ONDANSETRON HCL 4 MG/2ML IJ SOLN
4.0000 mg | Freq: Once | INTRAMUSCULAR | Status: AC
  Administered 2017-07-21: 4 mg via INTRAVENOUS
  Filled 2017-07-21: qty 2

## 2017-07-21 MED ORDER — HYDROMORPHONE HCL 1 MG/ML IJ SOLN
1.0000 mg | Freq: Once | INTRAMUSCULAR | Status: AC
  Administered 2017-07-21: 1 mg via INTRAVENOUS
  Filled 2017-07-21: qty 1

## 2017-07-21 MED ORDER — DOXYCYCLINE HYCLATE 100 MG PO TABS
100.0000 mg | ORAL_TABLET | Freq: Two times a day (BID) | ORAL | Status: DC
  Administered 2017-07-21 – 2017-07-24 (×7): 100 mg via ORAL
  Filled 2017-07-21 (×7): qty 1

## 2017-07-21 MED ORDER — MEROPENEM 1 G IV SOLR
1.0000 g | Freq: Three times a day (TID) | INTRAVENOUS | Status: DC
  Administered 2017-07-21 – 2017-07-24 (×9): 1 g via INTRAVENOUS
  Filled 2017-07-21 (×12): qty 1

## 2017-07-21 MED ORDER — ACETAMINOPHEN 325 MG PO TABS
650.0000 mg | ORAL_TABLET | Freq: Four times a day (QID) | ORAL | Status: DC | PRN
  Administered 2017-07-21 – 2017-07-25 (×3): 650 mg via ORAL
  Filled 2017-07-21 (×3): qty 2

## 2017-07-21 MED ORDER — ALPRAZOLAM 0.5 MG PO TABS
0.5000 mg | ORAL_TABLET | Freq: Three times a day (TID) | ORAL | Status: DC | PRN
  Administered 2017-07-22 – 2017-07-25 (×3): 0.5 mg via ORAL
  Filled 2017-07-21 (×3): qty 1

## 2017-07-21 MED ORDER — DEXTROSE 5 % IV SOLN: 1.0000 g | Freq: Two times a day (BID) | INTRAVENOUS | Status: DC

## 2017-07-21 MED ORDER — ONDANSETRON HCL 4 MG/2ML IJ SOLN: 4.0000 mg | Freq: Four times a day (QID) | INTRAMUSCULAR | Status: DC | PRN

## 2017-07-21 MED ORDER — OXYCODONE HCL 5 MG PO TABS
5.0000 mg | ORAL_TABLET | ORAL | Status: DC | PRN
Start: 2017-07-21 — End: 2017-07-26
  Administered 2017-07-21 – 2017-07-26 (×16): 5 mg via ORAL
  Filled 2017-07-21 (×16): qty 1

## 2017-07-21 MED ORDER — LORATADINE 10 MG PO TABS
10.0000 mg | ORAL_TABLET | Freq: Every day | ORAL | Status: DC
  Administered 2017-07-21 – 2017-07-26 (×7): 10 mg via ORAL
  Filled 2017-07-21 (×7): qty 1

## 2017-07-21 MED ORDER — IOPAMIDOL (ISOVUE-300) INJECTION 61%
100.0000 mL | Freq: Once | INTRAVENOUS | Status: AC | PRN
  Administered 2017-07-21: 100 mL via INTRAVENOUS

## 2017-07-21 MED ORDER — ALBUTEROL SULFATE (2.5 MG/3ML) 0.083% IN NEBU: 2.5000 mg | INHALATION_SOLUTION | RESPIRATORY_TRACT | Status: DC | PRN

## 2017-07-21 MED ORDER — INSULIN ASPART 100 UNIT/ML ~~LOC~~ SOLN
0.0000 [IU] | Freq: Every day | SUBCUTANEOUS | Status: DC
Start: 2017-07-21 — End: 2017-07-26

## 2017-07-21 MED ORDER — ACETAMINOPHEN 650 MG RE SUPP: 650.0000 mg | Freq: Four times a day (QID) | RECTAL | Status: DC | PRN

## 2017-07-21 MED ORDER — HYDROMORPHONE HCL 1 MG/ML IJ SOLN
0.5000 mg | INTRAMUSCULAR | Status: DC | PRN
  Administered 2017-07-21 – 2017-07-26 (×7): 0.5 mg via INTRAVENOUS
  Filled 2017-07-21 (×7): qty 1

## 2017-07-21 MED ORDER — SODIUM CHLORIDE 0.9 % IV BOLUS (SEPSIS)
1000.0000 mL | Freq: Once | INTRAVENOUS | Status: AC
  Administered 2017-07-21: 1000 mL via INTRAVENOUS

## 2017-07-21 MED ORDER — ONDANSETRON HCL 4 MG PO TABS
4.0000 mg | ORAL_TABLET | Freq: Four times a day (QID) | ORAL | Status: DC | PRN
  Administered 2017-07-23 – 2017-07-25 (×2): 4 mg via ORAL
  Filled 2017-07-21 (×2): qty 1

## 2017-07-21 MED ORDER — INSULIN ASPART 100 UNIT/ML ~~LOC~~ SOLN
0.0000 [IU] | Freq: Three times a day (TID) | SUBCUTANEOUS | Status: DC
  Administered 2017-07-21: 2 [IU] via SUBCUTANEOUS
  Administered 2017-07-22: 3 [IU] via SUBCUTANEOUS
  Administered 2017-07-22: 1 [IU] via SUBCUTANEOUS
  Administered 2017-07-22 – 2017-07-23 (×3): 2 [IU] via SUBCUTANEOUS
  Administered 2017-07-23: 1 [IU] via SUBCUTANEOUS
  Administered 2017-07-24 – 2017-07-25 (×4): 2 [IU] via SUBCUTANEOUS
  Administered 2017-07-25: 1 [IU] via SUBCUTANEOUS
  Administered 2017-07-25 – 2017-07-26 (×3): 2 [IU] via SUBCUTANEOUS

## 2017-07-21 MED ORDER — PIPERACILLIN-TAZOBACTAM 3.375 G IVPB 30 MIN
3.3750 g | Freq: Once | INTRAVENOUS | Status: AC
  Administered 2017-07-21: 3.375 g via INTRAVENOUS
  Filled 2017-07-21: qty 50

## 2017-07-21 MED ORDER — ENOXAPARIN SODIUM 80 MG/0.8ML ~~LOC~~ SOLN
70.0000 mg | SUBCUTANEOUS | Status: DC
  Administered 2017-07-21 – 2017-07-25 (×5): 70 mg via SUBCUTANEOUS
  Filled 2017-07-21 (×5): qty 0.8

## 2017-07-21 NOTE — ED Triage Notes (Signed)
Lower abd pain with diarrhea and nausea since Wed.

## 2017-07-21 NOTE — H&P (Addendum)
History and Physical  Amy Molina BZJ:[id number] DOB: 03/05/1961 DOA: 07/21/2017   PCP: Everardo Beals, NP   Patient coming from: Home  Chief Complaint: fevers, abdominal pain  HPI:  Amy Molina is a 56 y.o. female with medical history of essential hypertension, diabetes mellitus, anxiety presented with 3-day history of fevers, chills and 1 day history of abdominal pain.  The patient began experiencing fevers and chills up to 101.6 F on July 18, 2017.  She has some associated dysuria.  She went to urgent care on July 19, 2017.  A small urine sample was collected, and the patient was discharged home with a prescription for Macrodantin.  Unfortunately, the patient continued to have fevers up to 101.8 F on the evening of July 20, 2017.  She began developing abdominal pain on the evening of July 20, 2017.  She took some naproxen which helped.  She began experiencing generalized malaise.  As a result, the patient presents emergency department for further evaluation.  She had some nausea without emesis.  She denied any chest pain, shortness breath, coughing, hemoptysis, diarrhea, hematochezia, melena.  Notably, the patient states that she has not been sexually active for 1 year.  She had a remote history of chlamydia in 2006. In the emergency department, the patient was afebrile hemodynamically stable saturating 94% on room air.  WBC was 26.7.  The remainder of the BMP and hepatic enzymes were unremarkable.  CT of the abdomen and pelvis revealed inflammatory changes in the right ovarian regionRLQ to the level of the ascending colon.  There was enlargement of the right ovary which revealed a complex appearance.  There was mesenteric inflammation in this region extending to the ascending colon.  There was loculated fluid in this region extending to the upper aspect of the uterus also crossing the midline to the left side of the uterus.  Follow-up pelvic ultrasound revealed a tubular  structure in the right adnexa suggesting tubo-ovarian abscess.  UA was negative for pyuria.  Urine pregnancy test was negative.  GYN, Dr. Elonda Husky, was consulted by ED.   Assessment/Plan: Tubo-ovarian abscess -Start imipenem and doxycycline -GYN  was consulted by EDP -Blood cultures x2 sets -Lactic acid  Personal history of penicillin allergy -Patient states that she has taken cephalexin in past without problems -use imipenem with caution  Essential hypertension -Holding losartan secondary to soft blood pressure  Diabetes mellitus type 2 -Hemoglobin A1c -NovoLog sliding scale  Morbid obesity -BMI 52.4 -Lifestyle modification  Anxiety -Continue home dose alprazolam  Asthma  -stable on room air -Albuterol as needed shortness of breath  Active Problems:   Tubo-ovarian abscess       Past Medical History:  Diagnosis Date  . Anxiety   . Arthritis    general stiffness of joints  . Asthma   . Diabetes mellitus ([place])   . Heart murmur    childhood years- no mention in adult yrs  . Hypertension   . Morbid obesity ([place])   . Seasonal allergies    Past Surgical History:  Procedure Laterality Date  . OOPHORECTOMY  05-2000   REMOVED LEFT TUBE AND OVARY  . WISDOM TOOTH EXTRACTION     Social History:  reports that  has never smoked. she has never used smokeless tobacco. She reports that she drinks alcohol. She reports that she does not use drugs.   Family History  Problem Relation Age of Onset  . Heart disease Mother   . Hypertension Father   .  Heart disease Father   . Colon polyps Father   . Diabetes Sister   . Diabetes Maternal Aunt        7 mat aunts diabetes  . Hypertension Maternal Aunt   . Lung disease Maternal Aunt   . Hypertension Paternal Aunt   . Colon cancer Neg Hx      Allergies  Allergen Reactions  . Penicillins Swelling    Has patient had a PCN reaction causing immediate rash, facial/tongue/throat swelling, SOB or lightheadedness with  hypotension: Yes Has patient had a PCN reaction causing severe rash involving mucus membranes or skin necrosis: No Has patient had a PCN reaction that required hospitalization Yes Has patient had a PCN reaction occurring within the last 10 years: Yes If all of the above answers are "NO", then may proceed with Cephalosporin use.   . Floxin [Ofloxacin]     MIGRAINES, INSOMNIA, PARANOIND, RINGING IN EARS  . Lipitor [Atorvastatin]     Severe myalgias     Prior to Admission medications   Medication Sig Start Date End Date Taking? Authorizing Provider  ALPRAZolam Duanne Moron) 0.5 MG tablet Take 1 tablet (0.5 mg total) by mouth 3 (three) times daily as needed for sleep or anxiety. 09/04/13  Yes Huel Cote, NP  aspirin 81 MG tablet Take 81 mg by mouth at bedtime.    Yes [provider]  Cyanocobalamin (VITAMIN B-12 CR PO) Take 1 tablet by mouth daily.   Yes [provider]  Fluticasone-Salmeterol (ADVAIR) 100-50 MCG/DOSE AEPB Inhale 1 puff into the lungs 2 (two) times daily as needed (wheezing).   Yes [provider]  loratadine (CLARITIN) 10 MG tablet Take 10 mg by mouth daily.   Yes [provider]  losartan (COZAAR) 50 MG tablet Take 1 tablet (50 mg total) by mouth daily. Patient taking differently: Take 50 mg by mouth at bedtime.  11/27/13  Yes Susy Frizzle, MD  metFORMIN (GLUCOPHAGE) 1000 MG tablet Take 1 tablet (1,000 mg total) by mouth daily with breakfast. 11/27/13  Yes Susy Frizzle, MD  Multiple Vitamin (MULTIVITAMIN WITH MINERALS) TABS tablet Take 1 tablet by mouth every morning.   Yes [provider]  naproxen sodium (ANAPROX) 220 MG tablet Take 440-660 mg by mouth 2 (two) times daily as needed (pain.).    Yes [provider]  nitrofurantoin, macrocrystal-monohydrate, (MACROBID) 100 MG capsule Take 100 mg every 12 (twelve) hours by mouth.   Yes [provider]  ondansetron (ZOFRAN-ODT) 8 MG disintegrating tablet Take 1  tablet every 8 (eight) hours by mouth. 07/19/17  Yes [provider]  ranitidine (ZANTAC) 150 MG tablet Take 150 mg by mouth at bedtime.    Yes [provider]  SUPREP BOWEL PREP SOLN Take 1 kit by mouth once. Patient not taking: Reported on 07/21/2017 02/16/15   Jerene Bears, MD    Review of Systems:  Constitutional:  No weight loss, night sweats, Head&Eyes: No headache.  No vision loss.  No eye pain or scotoma ENT:  No Difficulty swallowing,Tooth/dental problems,Sore throat,  No ear ache, post nasal drip,  Cardio-vascular:  No chest pain, Orthopnea, PND, swelling in lower extremities,  dizziness, palpitations  GI:  No   vomiting, diarrhea, loss of appetite, hematochezia, melena, heartburn, indigestion, Resp:  No shortness of breath with exertion or at rest. No cough. No coughing up of blood .No wheezing.No chest wall deformity  Skin:  no rash or lesions.  GU:  no dysuria, change in color  of urine, no urgency or frequency. No flank pain.  Musculoskeletal:  No joint pain or swelling. No decreased range of motion. No back pain.  Psych:  No change in mood or affect. No depression or anxiety. Neurologic: No headache, no dysesthesia, no focal weakness, no vision loss. No syncope  Physical Exam: Vitals:   07/21/17 0723 07/21/17 0943 07/21/17 1227  BP: 133/70 128/68 103/62  Pulse: (!) 111 95 90  Resp: '20 17 16  ' Temp: 98.1 F (36.7 C) 98 F (36.7 C)   TempSrc:  Oral   SpO2: 94% 93% 93%  Weight: (!) 142.9 kg (315 lb)    Height: '5\' 5"'  (1.651 m)     General:  A&O x 3, NAD, nontoxic, pleasant/cooperative Head/Eye: No conjunctival hemorrhage, no icterus, Shepherd/AT, No nystagmus ENT:  No icterus,  No thrush, good dentition, no pharyngeal exudate Neck:  No masses, no lymphadenpathy, no bruits CV:  RRR, no rub, no gallop, no S3 Lung:  CTAB, good air movement, no wheeze, no rhonchi Abdomen: soft/RLQ pain, +BS, nondistended, no peritoneal signs Ext: No cyanosis, No  rashes, No petechiae, No lymphangitis, No edema Neuro: CNII-XII intact, strength 4/5 in bilateral upper and lower extremities, no dysmetria  Labs on Admission:  Basic Metabolic Panel: Recent Labs  Lab 07/21/17 0757  NA 135  K 3.8  CL 99*  CO2 27  GLUCOSE 230*  BUN 9  CREATININE 0.78  CALCIUM 8.6*   Liver Function Tests: Recent Labs  Lab 07/21/17 0757  AST 20  ALT 24  ALKPHOS 94  BILITOT 2.1*  PROT 7.6  ALBUMIN 3.3*   Recent Labs  Lab 07/21/17 0757  LIPASE 31   No results for input(s): AMMONIA in the last 168 hours. CBC: Recent Labs  Lab 07/21/17 0757  WBC 26.7*  NEUTROABS 22.8*  HGB 14.3  HCT 44.2  MCV 97.4  PLT 222   Coagulation Profile: No results for input(s): INR, PROTIME in the last 168 hours. Cardiac Enzymes: No results for input(s): CKTOTAL, CKMB, CKMBINDEX, TROPONINI in the last 168 hours. BNP: Invalid input(s): POCBNP CBG: No results for input(s): GLUCAP in the last 168 hours. Urine analysis:    Component Value Date/Time   COLORURINE AMBER (A) 07/21/2017 0756   APPEARANCEUR HAZY (A) 07/21/2017 0756   LABSPEC 1.024 07/21/2017 0756   PHURINE 5.0 07/21/2017 0756   GLUCOSEU NEGATIVE 07/21/2017 0756   HGBUR NEGATIVE 07/21/2017 0756   BILIRUBINUR NEGATIVE 07/21/2017 0756   KETONESUR 5 (A) 07/21/2017 0756   PROTEINUR 30 (A) 07/21/2017 0756   UROBILINOGEN 1 10/13/2014 1548   NITRITE NEGATIVE 07/21/2017 0756   LEUKOCYTESUR NEGATIVE 07/21/2017 0756   Sepsis Labs: '@LABRCNTIP' (procalcitonin:4,lacticidven:4) ) Recent Results (from the past 240 hour(s))  Wet prep, genital     Status: Abnormal   Collection Time: 07/21/17 10:08 AM  Result Value Ref Range Status   Yeast Wet Prep HPF POC NONE SEEN NONE SEEN Final   Trich, Wet Prep NONE SEEN NONE SEEN Final   Clue Cells Wet Prep HPF POC PRESENT (A) NONE SEEN Final   WBC, Wet Prep HPF POC MANY (A) NONE SEEN Final   Sperm NONE SEEN  Final     Radiological Exams on Admission: US Pelvis  Transvanginal Non-ob (tv Only)  Result Date: 07/21/2017 CLINICAL DATA:  Right-sided pelvic pain. Evaluate for possible torsion. Negative urine pregnancy test. EXAM: TRANSABDOMINAL AND TRANSVAGINAL ULTRASOUND OF PELVIS DOPPLER ULTRASOUND OF OVARIES TECHNIQUE: Both transabdominal and transvaginal ultrasound examinations of the pelvis were performed. Transabdominal technique  was performed for global imaging of the pelvis including uterus, ovaries, adnexal regions, and pelvic cul-de-sac. It was necessary to proceed with endovaginal exam following the transabdominal exam to visualize the ovaries. Color and duplex Doppler ultrasound was utilized to evaluate blood flow to the ovaries. COMPARISON:  CT of the abdomen and pelvis on 07/21/2017 FINDINGS: Uterus Measurements: At least 11.7 x 7.4 x 7.1 cm. A large central heterogeneous uterine mass is 6.8 x 7.6 x 6.3 cm, compatible with fibroid. This mass displaces the endometrial canal to the right. Endometrium Thickness: 4.9 mm, displaced as described. No focal abnormality visualized. Right ovary Measurements: 4.8 x 4.6 x 4.0 cm. On transabdominal images only, there is suggestion of a tubular structure within the right adnexa, discrete from the right ovary. This structure is not confirmed on the endovaginal portion of the exam follow-up would correlate well with the CT abnormality. Left ovary Measurements: Surgically absent. Pulsed Doppler evaluation of both ovaries demonstrates normal low-resistance arterial and venous waveforms in the right ovary. Other findings Trace free pelvic fluid. Study quality is degraded by patient body habitus. IMPRESSION: 1. Large central uterine fibroid measuring 7.6 cm, displacing the endometrium to the right. 2. Left oophorectomy. 3. Incompletely evaluated right adnexal region. There is suggestion of a tubular structure only on the transabdominal images, suggesting hydrosalpinx, pyosalpinx or tubo-ovarian abscess. 4. No evidence for torsion at  this time. 5. Follow-up imaging is indicated. I would consider repeat pelvic imaging in 8-12 weeks. If the right adnexal region is incompletely evaluated at that time, MRI may be necessary. Electronically Signed   By: Nolon Nations M.D.   On: 07/21/2017 11:37   US Pelvis (transabdominal Only)  Result Date: 07/21/2017 CLINICAL DATA:  Right-sided pelvic pain. Evaluate for possible torsion. Negative urine pregnancy test. EXAM: TRANSABDOMINAL AND TRANSVAGINAL ULTRASOUND OF PELVIS DOPPLER ULTRASOUND OF OVARIES TECHNIQUE: Both transabdominal and transvaginal ultrasound examinations of the pelvis were performed. Transabdominal technique was performed for global imaging of the pelvis including uterus, ovaries, adnexal regions, and pelvic cul-de-sac. It was necessary to proceed with endovaginal exam following the transabdominal exam to visualize the ovaries. Color and duplex Doppler ultrasound was utilized to evaluate blood flow to the ovaries. COMPARISON:  CT of the abdomen and pelvis on 07/21/2017 FINDINGS: Uterus Measurements: At least 11.7 x 7.4 x 7.1 cm. A large central heterogeneous uterine mass is 6.8 x 7.6 x 6.3 cm, compatible with fibroid. This mass displaces the endometrial canal to the right. Endometrium Thickness: 4.9 mm, displaced as described. No focal abnormality visualized. Right ovary Measurements: 4.8 x 4.6 x 4.0 cm. On transabdominal images only, there is suggestion of a tubular structure within the right adnexa, discrete from the right ovary. This structure is not confirmed on the endovaginal portion of the exam follow-up would correlate well with the CT abnormality. Left ovary Measurements: Surgically absent. Pulsed Doppler evaluation of both ovaries demonstrates normal low-resistance arterial and venous waveforms in the right ovary. Other findings Trace free pelvic fluid. Study quality is degraded by patient body habitus. IMPRESSION: 1. Large central uterine fibroid measuring 7.6 cm, displacing  the endometrium to the right. 2. Left oophorectomy. 3. Incompletely evaluated right adnexal region. There is suggestion of a tubular structure only on the transabdominal images, suggesting hydrosalpinx, pyosalpinx or tubo-ovarian abscess. 4. No evidence for torsion at this time. 5. Follow-up imaging is indicated. I would consider repeat pelvic imaging in 8-12 weeks. If the right adnexal region is incompletely evaluated at that time, MRI may be necessary. Electronically  Signed   By: Nolon Nations M.D.   On: 07/21/2017 11:37   Ct Abdomen Pelvis W Contrast  Result Date: 07/21/2017 CLINICAL DATA:  Lower abdominal pain with nausea and diarrhea. Fever. EXAM: CT ABDOMEN AND PELVIS WITH CONTRAST TECHNIQUE: Multidetector CT imaging of the abdomen and pelvis was performed using the standard protocol following bolus administration of intravenous contrast. CONTRAST:  171m ISOVUE-300 IOPAMIDOL (ISOVUE-300) INJECTION 61% COMPARISON:  None. FINDINGS: Lower chest: There is bibasilar atelectasis. Hepatobiliary: Liver measures 20.2 cm in length. No focal liver lesions are evident. Gallbladder wall is not appreciably thickened. There is no biliary duct dilatation. Pancreas: There is no pancreatic mass or inflammatory focus. Spleen: No splenic lesions are evident. There is a small splenule anterior and inferior to the spleen. Adrenals/Urinary Tract: Adrenals appear normal bilaterally. Kidneys bilaterally show no evident mass or hydronephrosis on either side. There is no renal or ureteral calculus. Urinary bladder is midline with wall thickness within normal limits. Stomach/Bowel: There is no appreciable bowel wall thickening. There is inflammatory change in the right lower quadrant which appears to arise from the right ovary region which extends to the level of the proximal at ascending colon is felt to be separate from the ascending colon. There is no evident bowel obstruction. No free air or portal venous air is evident.  Vascular/Lymphatic: There is no abdominal aortic aneurysm. There is mild calcification in the common iliac arteries. Major mesenteric arteries appear normal. There is no evident adenopathy in the abdomen or pelvis. Reproductive: Uterus is anteverted. The uterus appears prominent, likely with leiomyomatous change. A well-defined leiomyomas not seen. The left ovary has been removed. The right ovary appears enlarged and somewhat complex in appearance measuring 6.6 x 5.8 x 5.0 cm in size. There is mesenteric inflammation throughout this area extending to the level of the ascending colon on the right as well as extending across the midline anterior slightly superior to the uterus. There is loculated fluid surrounding the upper aspect of the uterus, apparently tracking from the right adnexal region. Other: The appendix appears normal, although the appendix is near the inflammation which appears to arise from the right ovary. AP well-defined abscess containing areas not seen on this study. There is a ventral hernia containing fat but no bowel. The neck of this hernia measures 2.6 cm from right to left dimension and 2.4 cm from superior to inferior dimension. There is a nearby area of fat along the anterior abdominal wall just anterior to the rectus muscle and just superior to the ventral hernia. This presumed abdominal wall lipoma measures 3.3 x 2.3 cm. No ascites is evident in the abdomen or pelvis. Loculated fluid is noted along the anterior uterus as described above. Musculoskeletal: There is degenerative change in the lower thoracic and lumbar spine regions. There is moderately severe spinal stenosis at L4-5 due to diffuse bony hypertrophy and disc protrusion. There is moderate calcified central disc protrusion at L2-3. No blastic or lytic bone lesions are evident. There is no intramuscular or abdominal wall lesion. IMPRESSION: 1. The right ovary is enlarged with surrounding mesenteric thickening. Inflammation extends  to the right, extending to the level of the proximal at ascending colon. Fluid and inflammation extends to the left along the superior aspect of the uterus, crossing the midline to the left of the uterus. This appearance raises concern for potential tubo-ovarian abscess. Right ovarian torsion is a differential consideration; correlation with pelvic ultrasound including Doppler evaluation is felt to be advisable in this circumstance.  2. Appendix is felt to be within normal limits. There is inflammation in the periappendiceal region, felt to arise from the right ovary. 3. Prominent uterus with suspected leiomyomatous change, although a well-defined leiomyoma is not evident. 4.  Left ovary surgically absent. 5. No bowel obstruction. Mild inflammation along the wall of the ascending colon is felt to be due to inflammation arising from the right ovary. 6. Midline ventral hernia containing only fat. Just superior to this hernia, there is a benign lipoma in the anterior abdominal wall just superior to the hernia. 7.  Prominent liver without focal liver lesion. 8. No air-containing abscess seen in the abdomen or pelvis. No bowel obstruction. 9. Severe spinal stenosis at L4-5, multifactorial. Central calcified disc protrusion at L2-3 causing borderline narrowing of the thecal sac. 10.  No renal or ureteral calculus.  No hydronephrosis. Electronically Signed   By: Lowella Grip III M.D.   On: 07/21/2017 09:51   US Pelvic Doppler (torsion R/o Or Mass Arterial Flow)  Result Date: 07/21/2017 CLINICAL DATA:  Right-sided pelvic pain. Evaluate for possible torsion. Negative urine pregnancy test. EXAM: TRANSABDOMINAL AND TRANSVAGINAL ULTRASOUND OF PELVIS DOPPLER ULTRASOUND OF OVARIES TECHNIQUE: Both transabdominal and transvaginal ultrasound examinations of the pelvis were performed. Transabdominal technique was performed for global imaging of the pelvis including uterus, ovaries, adnexal regions, and pelvic cul-de-sac. It  was necessary to proceed with endovaginal exam following the transabdominal exam to visualize the ovaries. Color and duplex Doppler ultrasound was utilized to evaluate blood flow to the ovaries. COMPARISON:  CT of the abdomen and pelvis on 07/21/2017 FINDINGS: Uterus Measurements: At least 11.7 x 7.4 x 7.1 cm. A large central heterogeneous uterine mass is 6.8 x 7.6 x 6.3 cm, compatible with fibroid. This mass displaces the endometrial canal to the right. Endometrium Thickness: 4.9 mm, displaced as described. No focal abnormality visualized. Right ovary Measurements: 4.8 x 4.6 x 4.0 cm. On transabdominal images only, there is suggestion of a tubular structure within the right adnexa, discrete from the right ovary. This structure is not confirmed on the endovaginal portion of the exam follow-up would correlate well with the CT abnormality. Left ovary Measurements: Surgically absent. Pulsed Doppler evaluation of both ovaries demonstrates normal low-resistance arterial and venous waveforms in the right ovary. Other findings Trace free pelvic fluid. Study quality is degraded by patient body habitus. IMPRESSION: 1. Large central uterine fibroid measuring 7.6 cm, displacing the endometrium to the right. 2. Left oophorectomy. 3. Incompletely evaluated right adnexal region. There is suggestion of a tubular structure only on the transabdominal images, suggesting hydrosalpinx, pyosalpinx or tubo-ovarian abscess. 4. No evidence for torsion at this time. 5. Follow-up imaging is indicated. I would consider repeat pelvic imaging in 8-12 weeks. If the right adnexal region is incompletely evaluated at that time, MRI may be necessary. Electronically Signed   By: Nolon Nations M.D.   On: 07/21/2017 11:37   Dg Chest Port 1 View  Result Date: 07/21/2017 CLINICAL DATA:  Lower abd pain with diarrhea and nausea since Wed. Diffuse abd pain with peritonitis -r/o free air. EXAM: PORTABLE CHEST 1 VIEW COMPARISON:  11/26/2013 FINDINGS:  Heart size is accentuated by the portable technique. Shallow lung inflation accentuate bronchovascular markings. There are no focal consolidations or pleural effusions. No pulmonary edema. No evidence for free intraperitoneal air on this erect view of the chest. IMPRESSION: No evidence for acute cardiopulmonary abnormality. Electronically Signed   By: Nolon Nations M.D.   On: 07/21/2017 08:24  Time spent:60 minutes Code Status:   FULL Family Communication:  Sister updated at bedside Disposition Plan: expect 2-3 day hospitalization Consults called: GYN--Eure DVT Prophylaxis: Ages Lovenox  Khyli Swaim, DO  [facility] Pager [phone]  If 7PM-7AM, please contact night-coverage [email or web address] Password [email or web address] 07/21/2017, 2:26 PM

## 2017-07-21 NOTE — ED Notes (Signed)
Pt taken to US

## 2017-07-21 NOTE — ED Provider Notes (Signed)
[facility] EMERGENCY DEPARTMENT Provider Note   CSN: [id number] Arrival date & time: 07/21/17  9678     History   Chief Complaint Chief Complaint  Patient presents with  . Abdominal Pain    HPI Amy Molina is a 56 y.o. female.  HPI  Patient is a 56 year old female who has a known history of diabetes, hypertension, morbid obesity and asthma.  She presents to the hospital today with a complaint of abdominal pain.  She reports that this abdominal pain has been present for the last 3 days, initially it started with a back pain in the middle of the back which was sharp and acute in onset but that then this resolved.  She has since that time had abdominal discomfort which is mostly across the upper abdomen and epigastrium but also radiates into the right lower abdomen.  She has less pain on the left and has no diarrhea.  She initially had some dysuria and when she was seen at the urgent care 2 days ago was treated presumptively for urinary tract infection with Macrobid.  She states that that has not helped that much, she still has some dysuria but also has had fevers as high as 101.8.  She has had very little to eat, she has been trying to drink fluids.  If her symptoms do not seem to be getting better and may be getting slightly worse.  Past Medical History:  Diagnosis Date  . Anxiety   . Arthritis    general stiffness of joints  . Asthma   . Diabetes mellitus ([place])   . Heart murmur    childhood years- no mention in adult yrs  . Hypertension   . Morbid obesity ([place])   . Seasonal allergies     Patient Active Problem List   Diagnosis Date Noted  . Screening for colon cancer   . Benign neoplasm of rectum   . Fibroid uterus 10/22/2014  . Shortness of breath 11/26/2013  . Hypertension 03/19/2012  . Diabetes mellitus, type 2 ([place]) 03/19/2012  . Obesity, morbid ([place]) 03/19/2012    Past Surgical History:  Procedure Laterality Date  . OOPHORECTOMY  05-2000   REMOVED LEFT TUBE  AND OVARY  . WISDOM TOOTH EXTRACTION      OB History    Gravida Para Term Preterm AB Living   1       1 0   SAB TAB Ectopic Multiple Live Births   1               Home Medications    Prior to Admission medications   Medication Sig Start Date End Date Taking? Authorizing Provider  ALPRAZolam Duanne Moron) 0.5 MG tablet Take 1 tablet (0.5 mg total) by mouth 3 (three) times daily as needed for sleep or anxiety. 09/04/13  Yes Huel Cote, NP  aspirin 81 MG tablet Take 81 mg by mouth at bedtime.    Yes [provider]  Cyanocobalamin (VITAMIN B-12 CR PO) Take 1 tablet by mouth daily.   Yes [provider]  Fluticasone-Salmeterol (ADVAIR) 100-50 MCG/DOSE AEPB Inhale 1 puff into the lungs 2 (two) times daily as needed (wheezing).   Yes [provider]  loratadine (CLARITIN) 10 MG tablet Take 10 mg by mouth daily.   Yes [provider]  losartan (COZAAR) 50 MG tablet Take 1 tablet (50 mg total) by mouth daily. Patient taking differently: Take 50 mg by mouth at bedtime.  11/27/13  Yes Susy Frizzle,  MD  metFORMIN (GLUCOPHAGE) 1000 MG tablet Take 1 tablet (1,000 mg total) by mouth daily with breakfast. 11/27/13  Yes Susy Frizzle, MD  Multiple Vitamin (MULTIVITAMIN WITH MINERALS) TABS tablet Take 1 tablet by mouth every morning.   Yes [provider]  naproxen sodium (ANAPROX) 220 MG tablet Take 440-660 mg by mouth 2 (two) times daily as needed (pain.).    Yes [provider]  nitrofurantoin, macrocrystal-monohydrate, (MACROBID) 100 MG capsule Take 100 mg every 12 (twelve) hours by mouth.   Yes [provider]  ondansetron (ZOFRAN-ODT) 8 MG disintegrating tablet Take 1 tablet every 8 (eight) hours by mouth. 07/19/17  Yes [provider]  ranitidine (ZANTAC) 150 MG tablet Take 150 mg by mouth at bedtime.    Yes [provider]  SUPREP BOWEL PREP SOLN Take 1 kit by mouth once. Patient not taking: Reported on  07/21/2017 02/16/15   Pyrtle, Lajuan Lines, MD    Family History Family History  Problem Relation Age of Onset  . Heart disease Mother   . Hypertension Father   . Heart disease Father   . Colon polyps Father   . Diabetes Sister   . Diabetes Maternal Aunt        7 mat aunts diabetes  . Hypertension Maternal Aunt   . Lung disease Maternal Aunt   . Hypertension Paternal Aunt   . Colon cancer Neg Hx     Social History Social History   Tobacco Use  . Smoking status: Never Smoker  . Smokeless tobacco: Never Used  Substance Use Topics  . Alcohol use: Yes    Alcohol/week: 0.0 oz    Comment: RARELY  . Drug use: No     Allergies   Penicillins; Floxin [ofloxacin]; and Lipitor [atorvastatin]   Review of Systems Review of Systems   Physical Exam Updated Vital Signs BP 103/62 (BP Location: Right Arm)   Pulse 90   Temp 98 F (36.7 C) (Oral)   Resp 16   Ht _0  (1.651 m)   Wt (!) 142.9 kg (315 lb)   LMP 08/09/2016   SpO2 93%   BMI 52.42 kg/m   Physical Exam  Constitutional: She appears well-developed and well-nourished. No distress.  HENT:  Head: Normocephalic and atraumatic.  Mouth/Throat: Oropharynx is clear and moist. No oropharyngeal exudate.  Eyes: Conjunctivae and EOM are normal. Pupils are equal, round, and reactive to light. Right eye exhibits no discharge. Left eye exhibits no discharge. No scleral icterus.  Neck: Normal range of motion. Neck supple. No JVD present. No thyromegaly present.  Cardiovascular: Regular rhythm, normal heart sounds and intact distal pulses. Exam reveals no gallop and no friction rub.  No murmur heard. tachycardia  Pulmonary/Chest: Effort normal and breath sounds normal. No respiratory distress. She has no wheezes. She has no rales.  Abdominal: Soft. Bowel sounds are normal. She exhibits no distension and no mass. There is no hepatosplenomegaly. There is tenderness in the right upper quadrant, right lower quadrant, epigastric area and left  upper quadrant. There is rebound, guarding, tenderness at McBurney's point and positive Murphy's sign. There is no CVA tenderness. No hernia.  Genitourinary:  Genitourinary Comments: Chaperon present Milky off white d/c No CMT - fullness to the R adnexa without ttp. No FB, no bleeding Os closed  Musculoskeletal: Normal range of motion. She exhibits no edema or tenderness.  Lymphadenopathy:    She has no cervical adenopathy.  Neurological: She is alert. Coordination normal.  Skin: Skin is  warm and dry. No rash noted. No erythema.  Psychiatric: She has a normal mood and affect. Her behavior is normal.  Nursing note and vitals reviewed.    ED Treatments / Results  Labs (all labs ordered are listed, but only abnormal results are displayed) Labs Reviewed  WET PREP, GENITAL - Abnormal; Notable for the following components:      Result Value   Clue Cells Wet Prep HPF POC PRESENT (*)    WBC, Wet Prep HPF POC MANY (*)    All other components within normal limits  CBC WITH DIFFERENTIAL/PLATELET - Abnormal; Notable for the following components:   WBC 26.7 (*)    Neutro Abs 22.8 (*)    Monocytes Absolute 1.5 (*)    All other components within normal limits  COMPREHENSIVE METABOLIC PANEL - Abnormal; Notable for the following components:   Chloride 99 (*)    Glucose, Bld 230 (*)    Calcium 8.6 (*)    Albumin 3.3 (*)    Total Bilirubin 2.1 (*)    All other components within normal limits  URINALYSIS, ROUTINE W REFLEX MICROSCOPIC - Abnormal; Notable for the following components:   Color, Urine AMBER (*)    APPearance HAZY (*)    Ketones, ur 5 (*)    Protein, ur 30 (*)    Bacteria, UA MANY (*)    Squamous Epithelial / LPF 0-5 (*)    All other components within normal limits  URINE CULTURE  LIPASE, BLOOD  PREGNANCY, URINE  GC/CHLAMYDIA PROBE AMP ([place]) NOT AT [facility]     Radiology US Pelvis Transvanginal Non-ob (tv Only)  Result Date: 07/21/2017 CLINICAL DATA:  Right-sided  pelvic pain. Evaluate for possible torsion. Negative urine pregnancy test. EXAM: TRANSABDOMINAL AND TRANSVAGINAL ULTRASOUND OF PELVIS DOPPLER ULTRASOUND OF OVARIES TECHNIQUE: Both transabdominal and transvaginal ultrasound examinations of the pelvis were performed. Transabdominal technique was performed for global imaging of the pelvis including uterus, ovaries, adnexal regions, and pelvic cul-de-sac. It was necessary to proceed with endovaginal exam following the transabdominal exam to visualize the ovaries. Color and duplex Doppler ultrasound was utilized to evaluate blood flow to the ovaries. COMPARISON:  CT of the abdomen and pelvis on 07/21/2017 FINDINGS: Uterus Measurements: At least 11.7 x 7.4 x 7.1 cm. A large central heterogeneous uterine mass is 6.8 x 7.6 x 6.3 cm, compatible with fibroid. This mass displaces the endometrial canal to the right. Endometrium Thickness: 4.9 mm, displaced as described. No focal abnormality visualized. Right ovary Measurements: 4.8 x 4.6 x 4.0 cm. On transabdominal images only, there is suggestion of a tubular structure within the right adnexa, discrete from the right ovary. This structure is not confirmed on the endovaginal portion of the exam follow-up would correlate well with the CT abnormality. Left ovary Measurements: Surgically absent. Pulsed Doppler evaluation of both ovaries demonstrates normal low-resistance arterial and venous waveforms in the right ovary. Other findings Trace free pelvic fluid. Study quality is degraded by patient body habitus. IMPRESSION: 1. Large central uterine fibroid measuring 7.6 cm, displacing the endometrium to the right. 2. Left oophorectomy. 3. Incompletely evaluated right adnexal region. There is suggestion of a tubular structure only on the transabdominal images, suggesting hydrosalpinx, pyosalpinx or tubo-ovarian abscess. 4. No evidence for torsion at this time. 5. Follow-up imaging is indicated. I would consider repeat pelvic imaging  in 8-12 weeks. If the right adnexal region is incompletely evaluated at that time, MRI may be necessary. Electronically Signed   By: Nolon Nations M.D.  On: 07/21/2017 11:37   US Pelvis (transabdominal Only)  Result Date: 07/21/2017 CLINICAL DATA:  Right-sided pelvic pain. Evaluate for possible torsion. Negative urine pregnancy test. EXAM: TRANSABDOMINAL AND TRANSVAGINAL ULTRASOUND OF PELVIS DOPPLER ULTRASOUND OF OVARIES TECHNIQUE: Both transabdominal and transvaginal ultrasound examinations of the pelvis were performed. Transabdominal technique was performed for global imaging of the pelvis including uterus, ovaries, adnexal regions, and pelvic cul-de-sac. It was necessary to proceed with endovaginal exam following the transabdominal exam to visualize the ovaries. Color and duplex Doppler ultrasound was utilized to evaluate blood flow to the ovaries. COMPARISON:  CT of the abdomen and pelvis on 07/21/2017 FINDINGS: Uterus Measurements: At least 11.7 x 7.4 x 7.1 cm. A large central heterogeneous uterine mass is 6.8 x 7.6 x 6.3 cm, compatible with fibroid. This mass displaces the endometrial canal to the right. Endometrium Thickness: 4.9 mm, displaced as described. No focal abnormality visualized. Right ovary Measurements: 4.8 x 4.6 x 4.0 cm. On transabdominal images only, there is suggestion of a tubular structure within the right adnexa, discrete from the right ovary. This structure is not confirmed on the endovaginal portion of the exam follow-up would correlate well with the CT abnormality. Left ovary Measurements: Surgically absent. Pulsed Doppler evaluation of both ovaries demonstrates normal low-resistance arterial and venous waveforms in the right ovary. Other findings Trace free pelvic fluid. Study quality is degraded by patient body habitus. IMPRESSION: 1. Large central uterine fibroid measuring 7.6 cm, displacing the endometrium to the right. 2. Left oophorectomy. 3. Incompletely evaluated right  adnexal region. There is suggestion of a tubular structure only on the transabdominal images, suggesting hydrosalpinx, pyosalpinx or tubo-ovarian abscess. 4. No evidence for torsion at this time. 5. Follow-up imaging is indicated. I would consider repeat pelvic imaging in 8-12 weeks. If the right adnexal region is incompletely evaluated at that time, MRI may be necessary. Electronically Signed   By: Nolon Nations M.D.   On: 07/21/2017 11:37   Ct Abdomen Pelvis W Contrast  Result Date: 07/21/2017 CLINICAL DATA:  Lower abdominal pain with nausea and diarrhea. Fever. EXAM: CT ABDOMEN AND PELVIS WITH CONTRAST TECHNIQUE: Multidetector CT imaging of the abdomen and pelvis was performed using the standard protocol following bolus administration of intravenous contrast. CONTRAST:  126m ISOVUE-300 IOPAMIDOL (ISOVUE-300) INJECTION 61% COMPARISON:  None. FINDINGS: Lower chest: There is bibasilar atelectasis. Hepatobiliary: Liver measures 20.2 cm in length. No focal liver lesions are evident. Gallbladder wall is not appreciably thickened. There is no biliary duct dilatation. Pancreas: There is no pancreatic mass or inflammatory focus. Spleen: No splenic lesions are evident. There is a small splenule anterior and inferior to the spleen. Adrenals/Urinary Tract: Adrenals appear normal bilaterally. Kidneys bilaterally show no evident mass or hydronephrosis on either side. There is no renal or ureteral calculus. Urinary bladder is midline with wall thickness within normal limits. Stomach/Bowel: There is no appreciable bowel wall thickening. There is inflammatory change in the right lower quadrant which appears to arise from the right ovary region which extends to the level of the proximal at ascending colon is felt to be separate from the ascending colon. There is no evident bowel obstruction. No free air or portal venous air is evident. Vascular/Lymphatic: There is no abdominal aortic aneurysm. There is mild calcification  in the common iliac arteries. Major mesenteric arteries appear normal. There is no evident adenopathy in the abdomen or pelvis. Reproductive: Uterus is anteverted. The uterus appears prominent, likely with leiomyomatous change. A well-defined leiomyomas not seen. The left ovary  has been removed. The right ovary appears enlarged and somewhat complex in appearance measuring 6.6 x 5.8 x 5.0 cm in size. There is mesenteric inflammation throughout this area extending to the level of the ascending colon on the right as well as extending across the midline anterior slightly superior to the uterus. There is loculated fluid surrounding the upper aspect of the uterus, apparently tracking from the right adnexal region. Other: The appendix appears normal, although the appendix is near the inflammation which appears to arise from the right ovary. AP well-defined abscess containing areas not seen on this study. There is a ventral hernia containing fat but no bowel. The neck of this hernia measures 2.6 cm from right to left dimension and 2.4 cm from superior to inferior dimension. There is a nearby area of fat along the anterior abdominal wall just anterior to the rectus muscle and just superior to the ventral hernia. This presumed abdominal wall lipoma measures 3.3 x 2.3 cm. No ascites is evident in the abdomen or pelvis. Loculated fluid is noted along the anterior uterus as described above. Musculoskeletal: There is degenerative change in the lower thoracic and lumbar spine regions. There is moderately severe spinal stenosis at L4-5 due to diffuse bony hypertrophy and disc protrusion. There is moderate calcified central disc protrusion at L2-3. No blastic or lytic bone lesions are evident. There is no intramuscular or abdominal wall lesion. IMPRESSION: 1. The right ovary is enlarged with surrounding mesenteric thickening. Inflammation extends to the right, extending to the level of the proximal at ascending colon. Fluid and  inflammation extends to the left along the superior aspect of the uterus, crossing the midline to the left of the uterus. This appearance raises concern for potential tubo-ovarian abscess. Right ovarian torsion is a differential consideration; correlation with pelvic ultrasound including Doppler evaluation is felt to be advisable in this circumstance. 2. Appendix is felt to be within normal limits. There is inflammation in the periappendiceal region, felt to arise from the right ovary. 3. Prominent uterus with suspected leiomyomatous change, although a well-defined leiomyoma is not evident. 4.  Left ovary surgically absent. 5. No bowel obstruction. Mild inflammation along the wall of the ascending colon is felt to be due to inflammation arising from the right ovary. 6. Midline ventral hernia containing only fat. Just superior to this hernia, there is a benign lipoma in the anterior abdominal wall just superior to the hernia. 7.  Prominent liver without focal liver lesion. 8. No air-containing abscess seen in the abdomen or pelvis. No bowel obstruction. 9. Severe spinal stenosis at L4-5, multifactorial. Central calcified disc protrusion at L2-3 causing borderline narrowing of the thecal sac. 10.  No renal or ureteral calculus.  No hydronephrosis. Electronically Signed   By: Lowella Grip III M.D.   On: 07/21/2017 09:51   US Pelvic Doppler (torsion R/o Or Mass Arterial Flow)  Result Date: 07/21/2017 CLINICAL DATA:  Right-sided pelvic pain. Evaluate for possible torsion. Negative urine pregnancy test. EXAM: TRANSABDOMINAL AND TRANSVAGINAL ULTRASOUND OF PELVIS DOPPLER ULTRASOUND OF OVARIES TECHNIQUE: Both transabdominal and transvaginal ultrasound examinations of the pelvis were performed. Transabdominal technique was performed for global imaging of the pelvis including uterus, ovaries, adnexal regions, and pelvic cul-de-sac. It was necessary to proceed with endovaginal exam following the transabdominal exam to  visualize the ovaries. Color and duplex Doppler ultrasound was utilized to evaluate blood flow to the ovaries. COMPARISON:  CT of the abdomen and pelvis on 07/21/2017 FINDINGS: Uterus Measurements: At least 11.7 x  7.4 x 7.1 cm. A large central heterogeneous uterine mass is 6.8 x 7.6 x 6.3 cm, compatible with fibroid. This mass displaces the endometrial canal to the right. Endometrium Thickness: 4.9 mm, displaced as described. No focal abnormality visualized. Right ovary Measurements: 4.8 x 4.6 x 4.0 cm. On transabdominal images only, there is suggestion of a tubular structure within the right adnexa, discrete from the right ovary. This structure is not confirmed on the endovaginal portion of the exam follow-up would correlate well with the CT abnormality. Left ovary Measurements: Surgically absent. Pulsed Doppler evaluation of both ovaries demonstrates normal low-resistance arterial and venous waveforms in the right ovary. Other findings Trace free pelvic fluid. Study quality is degraded by patient body habitus. IMPRESSION: 1. Large central uterine fibroid measuring 7.6 cm, displacing the endometrium to the right. 2. Left oophorectomy. 3. Incompletely evaluated right adnexal region. There is suggestion of a tubular structure only on the transabdominal images, suggesting hydrosalpinx, pyosalpinx or tubo-ovarian abscess. 4. No evidence for torsion at this time. 5. Follow-up imaging is indicated. I would consider repeat pelvic imaging in 8-12 weeks. If the right adnexal region is incompletely evaluated at that time, MRI may be necessary. Electronically Signed   By: Nolon Nations M.D.   On: 07/21/2017 11:37   Dg Chest Port 1 View  Result Date: 07/21/2017 CLINICAL DATA:  Lower abd pain with diarrhea and nausea since Wed. Diffuse abd pain with peritonitis -r/o free air. EXAM: PORTABLE CHEST 1 VIEW COMPARISON:  11/26/2013 FINDINGS: Heart size is accentuated by the portable technique. Shallow lung inflation  accentuate bronchovascular markings. There are no focal consolidations or pleural effusions. No pulmonary edema. No evidence for free intraperitoneal air on this erect view of the chest. IMPRESSION: No evidence for acute cardiopulmonary abnormality. Electronically Signed   By: Nolon Nations M.D.   On: 07/21/2017 08:24    Procedures Procedures (including critical care time)  Medications Ordered in ED Medications  doxycycline (VIBRA-TABS) tablet 100 mg (not administered)  piperacillin-tazobactam (ZOSYN) IVPB 3.375 g (not administered)  sodium chloride 0.9 % bolus 1,000 mL (0 mLs Intravenous Stopped 07/21/17 1250)  ondansetron (ZOFRAN) injection 4 mg (4 mg Intravenous Given 07/21/17 0813)  HYDROmorphone (DILAUDID) injection 1 mg (1 mg Intravenous Given 07/21/17 0812)  iopamidol (ISOVUE-300) 61 % injection 100 mL (100 mLs Intravenous Contrast Given 07/21/17 0918)     Initial Impression / Assessment and Plan / ED Course  I have reviewed the triage vital signs and the nursing notes.  Pertinent labs & imaging results that were available during my care of the patient were reviewed by me and considered in my medical decision making (see chart for details).     The patient has no jaundice, she has an abnormal abdominal exam and I would consider multiple different etiologies including kidney stone, pyelonephritis, pancreatitis, cholecystitis or even appendicitis with a perforation given the fevers.  At this time the patient is tachycardic, slightly dehydrated, will give IV fluids, labs and plan for a CT scan of the abdomen and pelvis with contrast.  CT scan raises concern for possible tubo-ovarian abscess.  Ultrasound ordered to rule out both TOA and ovarian torsion.  Ultrasound confirms a right tubal structure which is abnormal which could be consistent with an ovarian abscess  D/w Dr. Elonda Husky - he will consult - requests admit to hospitalist.   Zosyn requested.  D/w Dr. Carles Collet - will admit  Final  Clinical Impressions(s) / ED Diagnoses   Final diagnoses:  Tubo-ovarian abscess  ED Discharge Orders    None       Noemi Chapel, MD 07/21/17 1400

## 2017-07-22 ENCOUNTER — Inpatient Hospital Stay (HOSPITAL_COMMUNITY): Payer: 59

## 2017-07-22 DIAGNOSIS — R109 Unspecified abdominal pain: Secondary | ICD-10-CM

## 2017-07-22 DIAGNOSIS — D259 Leiomyoma of uterus, unspecified: Secondary | ICD-10-CM

## 2017-07-22 LAB — BASIC METABOLIC PANEL
Anion gap: 10 (ref 5–15)
BUN: 14 mg/dL (ref 6–20)
CALCIUM: 8.6 mg/dL — AB (ref 8.9–10.3)
CO2: 27 mmol/L (ref 22–32)
CREATININE: 0.84 mg/dL (ref 0.44–1.00)
Chloride: 101 mmol/L (ref 101–111)
GFR calc non Af Amer: >60 mL/min (ref >60)
Glucose, Bld: 147 mg/dL — ABNORMAL HIGH (ref 65–99)
Potassium: 3.5 mmol/L (ref 3.5–5.1)
Sodium: 138 mmol/L (ref 135–145)

## 2017-07-22 LAB — HEMOGLOBIN A1C
Hgb A1c MFr Bld: 7.1 % — ABNORMAL HIGH (ref 4.8–5.6)
Mean Plasma Glucose: 157.07 mg/dL

## 2017-07-22 LAB — CBC
HCT: 41 % (ref 36.0–46.0)
Hemoglobin: 13.5 g/dL (ref 12.0–15.0)
MCH: 32.2 pg (ref 26.0–34.0)
MCHC: 32.9 g/dL (ref 30.0–36.0)
MCV: 97.9 fL (ref 78.0–100.0)
PLATELETS: 239 10*3/uL (ref 150–400)
RBC: 4.19 MIL/uL (ref 3.87–5.11)
RDW: 13.6 % (ref 11.5–15.5)
WBC: 24.8 10*3/uL — ABNORMAL HIGH (ref 4.0–10.5)

## 2017-07-22 LAB — GLUCOSE, CAPILLARY
GLUCOSE-CAPILLARY: 122 mg/dL — AB (ref 65–99)
GLUCOSE-CAPILLARY: 184 mg/dL — AB (ref 65–99)
Glucose-Capillary: 216 mg/dL — ABNORMAL HIGH (ref 65–99)

## 2017-07-22 LAB — HIV ANTIBODY (ROUTINE TESTING W REFLEX): HIV SCREEN 4TH GENERATION: NONREACTIVE

## 2017-07-22 MED ORDER — SENNA 8.6 MG PO TABS
2.0000 | ORAL_TABLET | Freq: Every day | ORAL | Status: DC
  Administered 2017-07-22 – 2017-07-26 (×5): 17.2 mg via ORAL
  Filled 2017-07-22 (×5): qty 2

## 2017-07-22 MED ORDER — POLYETHYLENE GLYCOL 3350 17 G PO PACK
17.0000 g | PACK | Freq: Every day | ORAL | Status: DC
  Administered 2017-07-22 – 2017-07-26 (×4): 17 g via ORAL
  Filled 2017-07-22 (×5): qty 1

## 2017-07-22 NOTE — Consult Note (Signed)
Assessment/Plan: Tubo ovarian abscess 6.6 x 5.8 x 5.0 cm Leiomyoma of the uterus 7 cm  Continue meropenem/doxycycline regimen, should recognize a response after 48-72 hours of therapy, pt does not appear clinically or from labarotory data to be septic  Will consider IR drainage after 48-72 hours of antibiotics which would be Monday, again depending on clinical response     Reason for Consult:Tubo ovarian abscess Referring Physician: Hospitalist service  Amy Molina is an 56 y.o. female.  I was consulted on the patient by the ED physician and recommended admission for IV antibiotic therapy  I am on call at [facility] in house both Friday and Sunday so am following via Epic as well  Patient is a 56 year old female who has a known history of diabetes, hypertension, morbid obesity and asthma.  She presents to the hospital today with a complaint of abdominal pain.  She reports that this abdominal pain has been present for the last 3 days, initially it started with a back pain in the middle of the back which was sharp and acute in onset but that then this resolved.  She has since that time had abdominal discomfort which is mostly across the upper abdomen and epigastrium but also radiates into the right lower abdomen.  She has less pain on the left and has no diarrhea.  She initially had some dysuria and when she was seen at the urgent care 2 days ago was treated presumptively for urinary tract infection with Macrobid.  She states that that has not helped that much, she still has some dysuria but also has had fevers as high as 101.8.  She has had very little to eat, she has been trying to drink fluids.  If her symptoms do not seem to be getting better and may be getting slightly worse.  WBC is markedly elevated and CT reveals evidence of a TOA and I recommended pt be admitted for IV antibiotics  Today her pain is better but patient states it is still present, no rebound no guarding  Pertinent  Gynecological History: Hx of PCOS with lifelong irregular menses, no period in over 1 year, post menopausal status Menstrual History:  Patient's last menstrual period was 08/09/2016.    Past Medical History:  Diagnosis Date  . Anxiety   . Arthritis    general stiffness of joints  . Asthma   . Diabetes mellitus ([place])   . Heart murmur    childhood years- no mention in adult yrs  . Hypertension   . Morbid obesity ([place])   . Seasonal allergies     Past Surgical History:  Procedure Laterality Date  . OOPHORECTOMY  05-2000   REMOVED LEFT TUBE AND OVARY  . WISDOM TOOTH EXTRACTION      Family History  Problem Relation Age of Onset  . Heart disease Mother   . Hypertension Father   . Heart disease Father   . Colon polyps Father   . Diabetes Sister   . Diabetes Maternal Aunt        7 mat aunts diabetes  . Hypertension Maternal Aunt   . Lung disease Maternal Aunt   . Hypertension Paternal Aunt   . Colon cancer Neg Hx     Social History:  reports that  has never smoked. she has never used smokeless tobacco. She reports that she drinks alcohol. She reports that she does not use drugs.  Allergies:  Allergies  Allergen Reactions  . Penicillins Swelling    Has patient  had a PCN reaction causing immediate rash, facial/tongue/throat swelling, SOB or lightheadedness with hypotension: Yes Has patient had a PCN reaction causing severe rash involving mucus membranes or skin necrosis: No Has patient had a PCN reaction that required hospitalization Yes Has patient had a PCN reaction occurring within the last 10 years: Yes If all of the above answers are "NO", then may proceed with Cephalosporin use.   . Floxin [Ofloxacin]     MIGRAINES, INSOMNIA, PARANOIND, RINGING IN EARS  . Lipitor [Atorvastatin]     Severe myalgias    Medications:  Prior to Admission:  Medications Prior to Admission  Medication Sig Dispense Refill Last Dose  . ALPRAZolam (XANAX) 0.5 MG tablet Take 1 tablet  (0.5 mg total) by mouth 3 (three) times daily as needed for sleep or anxiety. 30 tablet 1 Past Week at Unknown time  . aspirin 81 MG tablet Take 81 mg by mouth at bedtime.    07/20/2017 at Unknown time  . Cyanocobalamin (VITAMIN B-12 CR PO) Take 1 tablet by mouth daily.   07/20/2017 at Unknown time  . Fluticasone-Salmeterol (ADVAIR) 100-50 MCG/DOSE AEPB Inhale 1 puff into the lungs 2 (two) times daily as needed (wheezing).   07/20/2017 at Unknown time  . loratadine (CLARITIN) 10 MG tablet Take 10 mg by mouth daily.   07/20/2017 at Unknown time  . losartan (COZAAR) 50 MG tablet Take 1 tablet (50 mg total) by mouth daily. (Patient taking differently: Take 50 mg by mouth at bedtime. ) 90 tablet 3 07/20/2017 at Unknown time  . metFORMIN (GLUCOPHAGE) 1000 MG tablet Take 1 tablet (1,000 mg total) by mouth daily with breakfast. 90 tablet 3 07/20/2017 at Unknown time  . Multiple Vitamin (MULTIVITAMIN WITH MINERALS) TABS tablet Take 1 tablet by mouth every morning.   07/20/2017 at Unknown time  . naproxen sodium (ANAPROX) 220 MG tablet Take 440-660 mg by mouth 2 (two) times daily as needed (pain.).    08/03/2015 at 2200  . nitrofurantoin, macrocrystal-monohydrate, (MACROBID) 100 MG capsule Take 100 mg every 12 (twelve) hours by mouth.   07/20/2017 at Unknown time  . ondansetron (ZOFRAN-ODT) 8 MG disintegrating tablet Take 1 tablet every 8 (eight) hours by mouth.   07/20/2017 at Unknown time  . ranitidine (ZANTAC) 150 MG tablet Take 150 mg by mouth at bedtime.    07/20/2017 at Unknown time  . SUPREP BOWEL PREP SOLN Take 1 kit by mouth once. (Patient not taking: Reported on 07/21/2017) 354 mL 0 Not Taking at Unknown time    ROS  Blood pressure 128/62, pulse 87, temperature 98.3 F (36.8 C), temperature source Oral, resp. rate 12, height '5\' 5"'  (1.651 m), weight (!) 311 lb 4.8 oz (141.2 kg), last menstrual period 08/09/2016, SpO2 95 %. Physical Exam  Results for orders placed or performed during the hospital encounter  of 07/21/17 (from the past 48 hour(s))  Urinalysis, Routine w reflex microscopic     Status: Abnormal   Collection Time: 07/21/17  7:56 AM  Result Value Ref Range   Color, Urine AMBER (A) YELLOW    Comment: BIOCHEMICALS MAY BE AFFECTED BY COLOR   APPearance HAZY (A) CLEAR   Specific Gravity, Urine 1.024 1.005 - 1.030   pH 5.0 5.0 - 8.0   Glucose, UA NEGATIVE NEGATIVE mg/dL   Hgb urine dipstick NEGATIVE NEGATIVE   Bilirubin Urine NEGATIVE NEGATIVE   Ketones, ur 5 (A) NEGATIVE mg/dL   Protein, ur 30 (A) NEGATIVE mg/dL   Nitrite NEGATIVE NEGATIVE  Leukocytes, UA NEGATIVE NEGATIVE   RBC / HPF 0-5 0 - 5 RBC/hpf   WBC, UA 0-5 0 - 5 WBC/hpf   Bacteria, UA MANY (A) NONE SEEN   Squamous Epithelial / LPF 0-5 (A) NONE SEEN   Mucus PRESENT    Hyaline Casts, UA PRESENT   Pregnancy, urine     Status: None   Collection Time: 07/21/17  7:56 AM  Result Value Ref Range   Preg Test, Ur NEGATIVE NEGATIVE  Urine Culture     Status: Abnormal (Preliminary result)   Collection Time: 07/21/17  7:56 AM  Result Value Ref Range   Specimen Description URINE, CATHETERIZED    Special Requests NONE    Culture 50,000 COLONIES/mL GRAM NEGATIVE RODS (A)    Report Status PENDING   CBC with Differential/Platelet     Status: Abnormal   Collection Time: 07/21/17  7:57 AM  Result Value Ref Range   WBC 26.7 (H) 4.0 - 10.5 K/uL   RBC 4.54 3.87 - 5.11 MIL/uL   Hemoglobin 14.3 12.0 - 15.0 g/dL   HCT 44.2 36.0 - 46.0 %   MCV 97.4 78.0 - 100.0 fL   MCH 31.5 26.0 - 34.0 pg   MCHC 32.4 30.0 - 36.0 g/dL   RDW 13.4 11.5 - 15.5 %   Platelets 222 150 - 400 K/uL   Neutrophils Relative % 86 %   Neutro Abs 22.8 (H) 1.7 - 7.7 K/uL   Lymphocytes Relative 9 %   Lymphs Abs 2.3 0.7 - 4.0 K/uL   Monocytes Relative 6 %   Monocytes Absolute 1.5 (H) 0.1 - 1.0 K/uL   Eosinophils Relative 0 %   Eosinophils Absolute 0.0 0.0 - 0.7 K/uL   Basophils Relative 0 %   Basophils Absolute 0.0 0.0 - 0.1 K/uL   WBC Morphology WHITE COUNT  CONFIRMED ON SMEAR   Comprehensive metabolic panel     Status: Abnormal   Collection Time: 07/21/17  7:57 AM  Result Value Ref Range   Sodium 135 135 - 145 mmol/L   Potassium 3.8 3.5 - 5.1 mmol/L   Chloride 99 (L) 101 - 111 mmol/L   CO2 27 22 - 32 mmol/L   Glucose, Bld 230 (H) 65 - 99 mg/dL   BUN 9 6 - 20 mg/dL   Creatinine, Ser 0.78 0.44 - 1.00 mg/dL   Calcium 8.6 (L) 8.9 - 10.3 mg/dL   Total Protein 7.6 6.5 - 8.1 g/dL   Albumin 3.3 (L) 3.5 - 5.0 g/dL   AST 20 15 - 41 U/L   ALT 24 14 - 54 U/L   Alkaline Phosphatase 94 38 - 126 U/L   Total Bilirubin 2.1 (H) 0.3 - 1.2 mg/dL   GFR calc non Af Amer >60 >60 mL/min   GFR calc Af Amer >60 >60 mL/min    Comment: (NOTE) The eGFR has been calculated using the CKD EPI equation. This calculation has not been validated in all clinical situations. eGFR's persistently <60 mL/min signify possible Chronic Kidney Disease.    Anion gap 9 5 - 15  Lipase, blood     Status: None   Collection Time: 07/21/17  7:57 AM  Result Value Ref Range   Lipase 31 11 - 51 U/L  Wet prep, genital     Status: Abnormal   Collection Time: 07/21/17 10:08 AM  Result Value Ref Range   Yeast Wet Prep HPF POC NONE SEEN NONE SEEN   Trich, Wet Prep NONE SEEN NONE SEEN  Clue Cells Wet Prep HPF POC PRESENT (A) NONE SEEN   WBC, Wet Prep HPF POC MANY (A) NONE SEEN   Sperm NONE SEEN   Culture, blood (Routine X 2) w Reflex to ID Panel     Status: None (Preliminary result)   Collection Time: 07/21/17  3:06 PM  Result Value Ref Range   Specimen Description LEFT ANTECUBITAL    Special Requests      BOTTLES DRAWN AEROBIC AND ANAEROBIC Blood Culture results may not be optimal due to an inadequate volume of blood received in culture bottles   Culture NO GROWTH < 24 HOURS    Report Status PENDING   Culture, blood (Routine X 2) w Reflex to ID Panel     Status: None (Preliminary result)   Collection Time: 07/21/17  3:06 PM  Result Value Ref Range   Specimen Description LEFT  ANTECUBITAL    Special Requests      BOTTLES DRAWN AEROBIC AND ANAEROBIC Blood Culture results may not be optimal due to an inadequate volume of blood received in culture bottles   Culture NO GROWTH < 24 HOURS    Report Status PENDING   HIV antibody (Routine Testing)     Status: None   Collection Time: 07/21/17  3:35 PM  Result Value Ref Range   HIV Screen 4th Generation wRfx Non Reactive Non Reactive    Comment: (NOTE) Performed At: [facility] [address] [place], [place] [id number] Rush Farmer MD OI:[id number]   Lactic acid, plasma     Status: None   Collection Time: 07/21/17  3:35 PM  Result Value Ref Range   Lactic Acid, Venous 1.6 0.5 - 1.9 mmol/L  Glucose, capillary     Status: Abnormal   Collection Time: 07/21/17  4:35 PM  Result Value Ref Range   Glucose-Capillary 172 (H) 65 - 99 mg/dL   Comment 1 Notify RN    Comment 2 Document in Chart   Glucose, capillary     Status: Abnormal   Collection Time: 07/21/17  9:00 PM  Result Value Ref Range   Glucose-Capillary 168 (H) 65 - 99 mg/dL   Comment 1 Notify RN    Comment 2 Document in Chart   CBC     Status: Abnormal   Collection Time: 07/22/17  7:51 AM  Result Value Ref Range   WBC 24.8 (H) 4.0 - 10.5 K/uL   RBC 4.19 3.87 - 5.11 MIL/uL   Hemoglobin 13.5 12.0 - 15.0 g/dL   HCT 41.0 36.0 - 46.0 %   MCV 97.9 78.0 - 100.0 fL   MCH 32.2 26.0 - 34.0 pg   MCHC 32.9 30.0 - 36.0 g/dL   RDW 13.6 11.5 - 15.5 %   Platelets 239 150 - 400 K/uL  Basic metabolic panel     Status: Abnormal   Collection Time: 07/22/17  7:51 AM  Result Value Ref Range   Sodium 138 135 - 145 mmol/L   Potassium 3.5 3.5 - 5.1 mmol/L   Chloride 101 101 - 111 mmol/L   CO2 27 22 - 32 mmol/L   Glucose, Bld 147 (H) 65 - 99 mg/dL   BUN 14 6 - 20 mg/dL   Creatinine, Ser 0.84 0.44 - 1.00 mg/dL   Calcium 8.6 (L) 8.9 - 10.3 mg/dL   GFR calc non Af Amer >60 >60 mL/min   GFR calc Af Amer >60 >60 mL/min    Comment: (NOTE) The eGFR has been  calculated using the CKD EPI  equation. This calculation has not been validated in all clinical situations. eGFR's persistently <60 mL/min signify possible Chronic Kidney Disease.    Anion gap 10 5 - 15  Glucose, capillary     Status: Abnormal   Collection Time: 07/22/17  8:02 AM  Result Value Ref Range   Glucose-Capillary 122 (H) 65 - 99 mg/dL    US Pelvis Transvanginal Non-ob (tv Only)  Result Date: 07/21/2017 CLINICAL DATA:  Right-sided pelvic pain. Evaluate for possible torsion. Negative urine pregnancy test. EXAM: TRANSABDOMINAL AND TRANSVAGINAL ULTRASOUND OF PELVIS DOPPLER ULTRASOUND OF OVARIES TECHNIQUE: Both transabdominal and transvaginal ultrasound examinations of the pelvis were performed. Transabdominal technique was performed for global imaging of the pelvis including uterus, ovaries, adnexal regions, and pelvic cul-de-sac. It was necessary to proceed with endovaginal exam following the transabdominal exam to visualize the ovaries. Color and duplex Doppler ultrasound was utilized to evaluate blood flow to the ovaries. COMPARISON:  CT of the abdomen and pelvis on 07/21/2017 FINDINGS: Uterus Measurements: At least 11.7 x 7.4 x 7.1 cm. A large central heterogeneous uterine mass is 6.8 x 7.6 x 6.3 cm, compatible with fibroid. This mass displaces the endometrial canal to the right. Endometrium Thickness: 4.9 mm, displaced as described. No focal abnormality visualized. Right ovary Measurements: 4.8 x 4.6 x 4.0 cm. On transabdominal images only, there is suggestion of a tubular structure within the right adnexa, discrete from the right ovary. This structure is not confirmed on the endovaginal portion of the exam follow-up would correlate well with the CT abnormality. Left ovary Measurements: Surgically absent. Pulsed Doppler evaluation of both ovaries demonstrates normal low-resistance arterial and venous waveforms in the right ovary. Other findings Trace free pelvic fluid. Study quality is  degraded by patient body habitus. IMPRESSION: 1. Large central uterine fibroid measuring 7.6 cm, displacing the endometrium to the right. 2. Left oophorectomy. 3. Incompletely evaluated right adnexal region. There is suggestion of a tubular structure only on the transabdominal images, suggesting hydrosalpinx, pyosalpinx or tubo-ovarian abscess. 4. No evidence for torsion at this time. 5. Follow-up imaging is indicated. I would consider repeat pelvic imaging in 8-12 weeks. If the right adnexal region is incompletely evaluated at that time, MRI may be necessary. Electronically Signed   By: Nolon Nations M.D.   On: 07/21/2017 11:37   US Pelvis (transabdominal Only)  Result Date: 07/21/2017 CLINICAL DATA:  Right-sided pelvic pain. Evaluate for possible torsion. Negative urine pregnancy test. EXAM: TRANSABDOMINAL AND TRANSVAGINAL ULTRASOUND OF PELVIS DOPPLER ULTRASOUND OF OVARIES TECHNIQUE: Both transabdominal and transvaginal ultrasound examinations of the pelvis were performed. Transabdominal technique was performed for global imaging of the pelvis including uterus, ovaries, adnexal regions, and pelvic cul-de-sac. It was necessary to proceed with endovaginal exam following the transabdominal exam to visualize the ovaries. Color and duplex Doppler ultrasound was utilized to evaluate blood flow to the ovaries. COMPARISON:  CT of the abdomen and pelvis on 07/21/2017 FINDINGS: Uterus Measurements: At least 11.7 x 7.4 x 7.1 cm. A large central heterogeneous uterine mass is 6.8 x 7.6 x 6.3 cm, compatible with fibroid. This mass displaces the endometrial canal to the right. Endometrium Thickness: 4.9 mm, displaced as described. No focal abnormality visualized. Right ovary Measurements: 4.8 x 4.6 x 4.0 cm. On transabdominal images only, there is suggestion of a tubular structure within the right adnexa, discrete from the right ovary. This structure is not confirmed on the endovaginal portion of the exam follow-up would  correlate well with the CT abnormality. Left ovary Measurements: Surgically  absent. Pulsed Doppler evaluation of both ovaries demonstrates normal low-resistance arterial and venous waveforms in the right ovary. Other findings Trace free pelvic fluid. Study quality is degraded by patient body habitus. IMPRESSION: 1. Large central uterine fibroid measuring 7.6 cm, displacing the endometrium to the right. 2. Left oophorectomy. 3. Incompletely evaluated right adnexal region. There is suggestion of a tubular structure only on the transabdominal images, suggesting hydrosalpinx, pyosalpinx or tubo-ovarian abscess. 4. No evidence for torsion at this time. 5. Follow-up imaging is indicated. I would consider repeat pelvic imaging in 8-12 weeks. If the right adnexal region is incompletely evaluated at that time, MRI may be necessary. Electronically Signed   By: Nolon Nations M.D.   On: 07/21/2017 11:37   Ct Abdomen Pelvis W Contrast  Result Date: 07/21/2017 CLINICAL DATA:  Lower abdominal pain with nausea and diarrhea. Fever. EXAM: CT ABDOMEN AND PELVIS WITH CONTRAST TECHNIQUE: Multidetector CT imaging of the abdomen and pelvis was performed using the standard protocol following bolus administration of intravenous contrast. CONTRAST:  17m ISOVUE-300 IOPAMIDOL (ISOVUE-300) INJECTION 61% COMPARISON:  None. FINDINGS: Lower chest: There is bibasilar atelectasis. Hepatobiliary: Liver measures 20.2 cm in length. No focal liver lesions are evident. Gallbladder wall is not appreciably thickened. There is no biliary duct dilatation. Pancreas: There is no pancreatic mass or inflammatory focus. Spleen: No splenic lesions are evident. There is a small splenule anterior and inferior to the spleen. Adrenals/Urinary Tract: Adrenals appear normal bilaterally. Kidneys bilaterally show no evident mass or hydronephrosis on either side. There is no renal or ureteral calculus. Urinary bladder is midline with wall thickness within normal  limits. Stomach/Bowel: There is no appreciable bowel wall thickening. There is inflammatory change in the right lower quadrant which appears to arise from the right ovary region which extends to the level of the proximal at ascending colon is felt to be separate from the ascending colon. There is no evident bowel obstruction. No free air or portal venous air is evident. Vascular/Lymphatic: There is no abdominal aortic aneurysm. There is mild calcification in the common iliac arteries. Major mesenteric arteries appear normal. There is no evident adenopathy in the abdomen or pelvis. Reproductive: Uterus is anteverted. The uterus appears prominent, likely with leiomyomatous change. A well-defined leiomyomas not seen. The left ovary has been removed. The right ovary appears enlarged and somewhat complex in appearance measuring 6.6 x 5.8 x 5.0 cm in size. There is mesenteric inflammation throughout this area extending to the level of the ascending colon on the right as well as extending across the midline anterior slightly superior to the uterus. There is loculated fluid surrounding the upper aspect of the uterus, apparently tracking from the right adnexal region. Other: The appendix appears normal, although the appendix is near the inflammation which appears to arise from the right ovary. AP well-defined abscess containing areas not seen on this study. There is a ventral hernia containing fat but no bowel. The neck of this hernia measures 2.6 cm from right to left dimension and 2.4 cm from superior to inferior dimension. There is a nearby area of fat along the anterior abdominal wall just anterior to the rectus muscle and just superior to the ventral hernia. This presumed abdominal wall lipoma measures 3.3 x 2.3 cm. No ascites is evident in the abdomen or pelvis. Loculated fluid is noted along the anterior uterus as described above. Musculoskeletal: There is degenerative change in the lower thoracic and lumbar spine  regions. There is moderately severe spinal stenosis at L4-5  due to diffuse bony hypertrophy and disc protrusion. There is moderate calcified central disc protrusion at L2-3. No blastic or lytic bone lesions are evident. There is no intramuscular or abdominal wall lesion. IMPRESSION: 1. The right ovary is enlarged with surrounding mesenteric thickening. Inflammation extends to the right, extending to the level of the proximal at ascending colon. Fluid and inflammation extends to the left along the superior aspect of the uterus, crossing the midline to the left of the uterus. This appearance raises concern for potential tubo-ovarian abscess. Right ovarian torsion is a differential consideration; correlation with pelvic ultrasound including Doppler evaluation is felt to be advisable in this circumstance. 2. Appendix is felt to be within normal limits. There is inflammation in the periappendiceal region, felt to arise from the right ovary. 3. Prominent uterus with suspected leiomyomatous change, although a well-defined leiomyoma is not evident. 4.  Left ovary surgically absent. 5. No bowel obstruction. Mild inflammation along the wall of the ascending colon is felt to be due to inflammation arising from the right ovary. 6. Midline ventral hernia containing only fat. Just superior to this hernia, there is a benign lipoma in the anterior abdominal wall just superior to the hernia. 7.  Prominent liver without focal liver lesion. 8. No air-containing abscess seen in the abdomen or pelvis. No bowel obstruction. 9. Severe spinal stenosis at L4-5, multifactorial. Central calcified disc protrusion at L2-3 causing borderline narrowing of the thecal sac. 10.  No renal or ureteral calculus.  No hydronephrosis. Electronically Signed   By: Lowella Grip III M.D.   On: 07/21/2017 09:51   US Pelvic Doppler (torsion R/o Or Mass Arterial Flow)  Result Date: 07/21/2017 CLINICAL DATA:  Right-sided pelvic pain. Evaluate for  possible torsion. Negative urine pregnancy test. EXAM: TRANSABDOMINAL AND TRANSVAGINAL ULTRASOUND OF PELVIS DOPPLER ULTRASOUND OF OVARIES TECHNIQUE: Both transabdominal and transvaginal ultrasound examinations of the pelvis were performed. Transabdominal technique was performed for global imaging of the pelvis including uterus, ovaries, adnexal regions, and pelvic cul-de-sac. It was necessary to proceed with endovaginal exam following the transabdominal exam to visualize the ovaries. Color and duplex Doppler ultrasound was utilized to evaluate blood flow to the ovaries. COMPARISON:  CT of the abdomen and pelvis on 07/21/2017 FINDINGS: Uterus Measurements: At least 11.7 x 7.4 x 7.1 cm. A large central heterogeneous uterine mass is 6.8 x 7.6 x 6.3 cm, compatible with fibroid. This mass displaces the endometrial canal to the right. Endometrium Thickness: 4.9 mm, displaced as described. No focal abnormality visualized. Right ovary Measurements: 4.8 x 4.6 x 4.0 cm. On transabdominal images only, there is suggestion of a tubular structure within the right adnexa, discrete from the right ovary. This structure is not confirmed on the endovaginal portion of the exam follow-up would correlate well with the CT abnormality. Left ovary Measurements: Surgically absent. Pulsed Doppler evaluation of both ovaries demonstrates normal low-resistance arterial and venous waveforms in the right ovary. Other findings Trace free pelvic fluid. Study quality is degraded by patient body habitus. IMPRESSION: 1. Large central uterine fibroid measuring 7.6 cm, displacing the endometrium to the right. 2. Left oophorectomy. 3. Incompletely evaluated right adnexal region. There is suggestion of a tubular structure only on the transabdominal images, suggesting hydrosalpinx, pyosalpinx or tubo-ovarian abscess. 4. No evidence for torsion at this time. 5. Follow-up imaging is indicated. I would consider repeat pelvic imaging in 8-12 weeks. If the right  adnexal region is incompletely evaluated at that time, MRI may be necessary. Electronically Signed  By: Nolon Nations M.D.   On: 07/21/2017 11:37   Dg Chest Port 1 View  Result Date: 07/21/2017 CLINICAL DATA:  Lower abd pain with diarrhea and nausea since Wed. Diffuse abd pain with peritonitis -r/o free air. EXAM: PORTABLE CHEST 1 VIEW COMPARISON:  11/26/2013 FINDINGS: Heart size is accentuated by the portable technique. Shallow lung inflation accentuate bronchovascular markings. There are no focal consolidations or pleural effusions. No pulmonary edema. No evidence for free intraperitoneal air on this erect view of the chest. IMPRESSION: No evidence for acute cardiopulmonary abnormality. Electronically Signed   By: Nolon Nations M.D.   On: 07/21/2017 08:24    Assessment/Plan: Tubo ovarian abscess 6.6 x 5.8 x 5.0 cm Leiomyoma of the uterus 7 cm  Continue meropenem/doxycycline regimen, should recognize a response after 48-72 hours of therapy, pt does not appear clinically or from labarotory data to be septic Will consider IR drainage after 48-72 hours of antibiotics which would be Monday, again depending on clinical response   Agron Swiney H 07/22/2017

## 2017-07-22 NOTE — Progress Notes (Signed)
PROGRESS NOTE  Amy Molina YSA:[id number] DOB: 1961-02-23 DOA: 07/21/2017 PCP: Everardo Beals, NP  Brief History:   56 y.o. female with medical history of essential hypertension, diabetes mellitus, anxiety presented with 3-day history of fevers, chills and 1 day history of abdominal pain.  The patient began experiencing fevers and chills up to 101.6 F on July 18, 2017.  She has some associated dysuria.  She went to urgent care on July 19, 2017.  A small urine sample was collected, and the patient was discharged home with a prescription for Macrodantin.  Unfortunately, the patient continued to have fevers up to 101.8 F on the evening of July 20, 2017.  She began developing abdominal pain on the evening of July 20, 2017.  She took some naproxen which helped.  She began experiencing generalized malaise.  As a result, the patient presents emergency department for further evaluation.   CT of the abdomen and pelvis revealed inflammatory changes in the right ovarian regionRLQ to the level of the ascending colon.  There was enlargement of the right ovary which revealed a complex appearance.  There was mesenteric inflammation in this region extending to the ascending colon.  There was loculated fluid in this region extending to the upper aspect of the uterus also crossing the midline to the left side of the uterus.  Follow-up pelvic ultrasound revealed a tubular structure in the right adnexa suggesting tubo-ovarian abscess.  The patient was admitted and started on Merrem and doxycycline.  GYN, Dr. Elonda Husky was consulted.   Assessment/Plan: Tubo-ovarian abscess -Continue meropenem and doxycycline -GYN consulted  -Blood cultures x2 sets -Lactic acid--1.6 -am CBC--WBC remains elevated 24.8 -still spiking fevers -follow blood cultures  Abdominal pain -acute abd series--pt c/o worse pain after eating this am -start colace, senna  Personal history of penicillin allergy -Patient  states that she has taken cephalexin in past without problems -tolerating Merrem without difficulty  Essential hypertension -Holding losartan secondary to soft blood pressure initially -BP remains controlled  Diabetes mellitus type 2 -Hemoglobin A1c--controlled -NovoLog sliding scale  Morbid obesity -BMI 52.4 -Lifestyle modification  Anxiety -Continue home dose alprazolam  Asthma  -stable on room air -Albuterol as needed shortness of breath     Disposition Plan:   Home in 2-3 days if improves Family Communication:   Family at bedside updated 11/10  Consultants:  GYN  Code Status:  FULL   DVT Prophylaxis:  [place] Lovenox   Procedures: As Listed in Progress Note Above  Antibiotics: None    Subjective: Patient complained of worsening abdominal pain after she ate breakfast this morning.  She denies any headache, neck pain, chest pain, shortness breath, vomiting.  She has not had a bowel movement.  She denies any dysuria or hematuria.  Objective: Vitals:   07/21/17 1525 07/21/17 2306 07/22/17 0000 07/22/17 0706  BP: 123/66 (!) 132/52  128/62  Pulse: 86 (!) 102  87  Resp: 18 20  12   Temp: 98.9 F (37.2 C) (!) 101.5 F (38.6 C) 100.3 F (37.9 C) 98.3 F (36.8 C)  TempSrc: Oral Oral Oral Oral  SpO2: 95% 92%  95%  Weight: (!) 141.2 kg (311 lb 4.8 oz)     Height: 5\' 5"  (1.651 m)       Intake/Output Summary (Last 24 hours) at 07/22/2017 0914 Last data filed at 07/21/2017 1757 Gross per 24 hour  Intake 1200 ml  Output -  Net 1200 ml  Weight change:  Exam:   General:  Pt is alert, follows commands appropriately, not in acute distress  HEENT: No icterus, No thrush, No neck mass, [place]/AT  Cardiovascular: RRR, S1/S2, no rubs, no gallops  Respiratory: CTA bilaterally, no wheezing, no crackles, no rhonchi  Abdomen: Soft/+BS, RLQ tender, non distended, no guarding  Extremities: No edema, No lymphangitis, No petechiae, No rashes, no synovitis   Data  Reviewed: I have personally reviewed following labs and imaging studies Basic Metabolic Panel: Recent Labs  Lab 07/21/17 0757 07/22/17 0751  NA 135 138  K 3.8 3.5  CL 99* 101  CO2 27 27  GLUCOSE 230* 147*  BUN 9 14  CREATININE 0.78 0.84  CALCIUM 8.6* 8.6*   Liver Function Tests: Recent Labs  Lab 07/21/17 0757  AST 20  ALT 24  ALKPHOS 94  BILITOT 2.1*  PROT 7.6  ALBUMIN 3.3*   Recent Labs  Lab 07/21/17 0757  LIPASE 31   No results for input(s): AMMONIA in the last 168 hours. Coagulation Profile: No results for input(s): INR, PROTIME in the last 168 hours. CBC: Recent Labs  Lab 07/21/17 0757 07/22/17 0751  WBC 26.7* 24.8*  NEUTROABS 22.8*  --   HGB 14.3 13.5  HCT 44.2 41.0  MCV 97.4 97.9  PLT 222 239   Cardiac Enzymes: No results for input(s): CKTOTAL, CKMB, CKMBINDEX, TROPONINI in the last 168 hours. BNP: Invalid input(s): POCBNP CBG: Recent Labs  Lab 07/21/17 1635 07/21/17 2100 07/22/17 0802  GLUCAP 172* 168* 122*   HbA1C: No results for input(s): HGBA1C in the last 72 hours. Urine analysis:    Component Value Date/Time   COLORURINE AMBER (A) 07/21/2017 0756   APPEARANCEUR HAZY (A) 07/21/2017 0756   LABSPEC 1.024 07/21/2017 0756   PHURINE 5.0 07/21/2017 0756   GLUCOSEU NEGATIVE 07/21/2017 0756   HGBUR NEGATIVE 07/21/2017 0756   BILIRUBINUR NEGATIVE 07/21/2017 0756   KETONESUR 5 (A) 07/21/2017 0756   PROTEINUR 30 (A) 07/21/2017 0756   UROBILINOGEN 1 10/13/2014 1548   NITRITE NEGATIVE 07/21/2017 0756   LEUKOCYTESUR NEGATIVE 07/21/2017 0756   Sepsis Labs: @LABRCNTIP (procalcitonin:4,lacticidven:4) ) Recent Results (from the past 240 hour(s))  Wet prep, genital     Status: Abnormal   Collection Time: 07/21/17 10:08 AM  Result Value Ref Range Status   Yeast Wet Prep HPF POC NONE SEEN NONE SEEN Final   Trich, Wet Prep NONE SEEN NONE SEEN Final   Clue Cells Wet Prep HPF POC PRESENT (A) NONE SEEN Final   WBC, Wet Prep HPF POC MANY (A) NONE  SEEN Final   Sperm NONE SEEN  Final  Culture, blood (Routine X 2) w Reflex to ID Panel     Status: None (Preliminary result)   Collection Time: 07/21/17  3:06 PM  Result Value Ref Range Status   Specimen Description LEFT ANTECUBITAL  Final   Special Requests   Final    BOTTLES DRAWN AEROBIC AND ANAEROBIC Blood Culture results may not be optimal due to an inadequate volume of blood received in culture bottles   Culture NO GROWTH < 24 HOURS  Final   Report Status PENDING  Incomplete  Culture, blood (Routine X 2) w Reflex to ID Panel     Status: None (Preliminary result)   Collection Time: 07/21/17  3:06 PM  Result Value Ref Range Status   Specimen Description LEFT ANTECUBITAL  Final   Special Requests   Final    BOTTLES DRAWN AEROBIC AND ANAEROBIC Blood Culture results may  not be optimal due to an inadequate volume of blood received in culture bottles   Culture NO GROWTH < 24 HOURS  Final   Report Status PENDING  Incomplete     Scheduled Meds: . aspirin EC  81 mg Oral QHS  . doxycycline  100 mg Oral Q12H  . enoxaparin (LOVENOX) injection  70 mg Subcutaneous Q24H  . insulin aspart  0-5 Units Subcutaneous QHS  . insulin aspart  0-9 Units Subcutaneous TID WC  . loratadine  10 mg Oral Daily  . multivitamin with minerals  1 tablet Oral q morning - 10a   Continuous Infusions: . meropenem (MERREM) IV 1 g (07/22/17 0159)    Procedures/Studies: US Pelvis Transvanginal Non-ob (tv Only)  Result Date: 07/21/2017 CLINICAL DATA:  Right-sided pelvic pain. Evaluate for possible torsion. Negative urine pregnancy test. EXAM: TRANSABDOMINAL AND TRANSVAGINAL ULTRASOUND OF PELVIS DOPPLER ULTRASOUND OF OVARIES TECHNIQUE: Both transabdominal and transvaginal ultrasound examinations of the pelvis were performed. Transabdominal technique was performed for global imaging of the pelvis including uterus, ovaries, adnexal regions, and pelvic cul-de-sac. It was necessary to proceed with endovaginal exam  following the transabdominal exam to visualize the ovaries. Color and duplex Doppler ultrasound was utilized to evaluate blood flow to the ovaries. COMPARISON:  CT of the abdomen and pelvis on 07/21/2017 FINDINGS: Uterus Measurements: At least 11.7 x 7.4 x 7.1 cm. A large central heterogeneous uterine mass is 6.8 x 7.6 x 6.3 cm, compatible with fibroid. This mass displaces the endometrial canal to the right. Endometrium Thickness: 4.9 mm, displaced as described. No focal abnormality visualized. Right ovary Measurements: 4.8 x 4.6 x 4.0 cm. On transabdominal images only, there is suggestion of a tubular structure within the right adnexa, discrete from the right ovary. This structure is not confirmed on the endovaginal portion of the exam follow-up would correlate well with the CT abnormality. Left ovary Measurements: Surgically absent. Pulsed Doppler evaluation of both ovaries demonstrates normal low-resistance arterial and venous waveforms in the right ovary. Other findings Trace free pelvic fluid. Study quality is degraded by patient body habitus. IMPRESSION: 1. Large central uterine fibroid measuring 7.6 cm, displacing the endometrium to the right. 2. Left oophorectomy. 3. Incompletely evaluated right adnexal region. There is suggestion of a tubular structure only on the transabdominal images, suggesting hydrosalpinx, pyosalpinx or tubo-ovarian abscess. 4. No evidence for torsion at this time. 5. Follow-up imaging is indicated. I would consider repeat pelvic imaging in 8-12 weeks. If the right adnexal region is incompletely evaluated at that time, MRI may be necessary. Electronically Signed   By: Nolon Nations M.D.   On: 07/21/2017 11:37   US Pelvis (transabdominal Only)  Result Date: 07/21/2017 CLINICAL DATA:  Right-sided pelvic pain. Evaluate for possible torsion. Negative urine pregnancy test. EXAM: TRANSABDOMINAL AND TRANSVAGINAL ULTRASOUND OF PELVIS DOPPLER ULTRASOUND OF OVARIES TECHNIQUE: Both  transabdominal and transvaginal ultrasound examinations of the pelvis were performed. Transabdominal technique was performed for global imaging of the pelvis including uterus, ovaries, adnexal regions, and pelvic cul-de-sac. It was necessary to proceed with endovaginal exam following the transabdominal exam to visualize the ovaries. Color and duplex Doppler ultrasound was utilized to evaluate blood flow to the ovaries. COMPARISON:  CT of the abdomen and pelvis on 07/21/2017 FINDINGS: Uterus Measurements: At least 11.7 x 7.4 x 7.1 cm. A large central heterogeneous uterine mass is 6.8 x 7.6 x 6.3 cm, compatible with fibroid. This mass displaces the endometrial canal to the right. Endometrium Thickness: 4.9 mm, displaced as described. No  focal abnormality visualized. Right ovary Measurements: 4.8 x 4.6 x 4.0 cm. On transabdominal images only, there is suggestion of a tubular structure within the right adnexa, discrete from the right ovary. This structure is not confirmed on the endovaginal portion of the exam follow-up would correlate well with the CT abnormality. Left ovary Measurements: Surgically absent. Pulsed Doppler evaluation of both ovaries demonstrates normal low-resistance arterial and venous waveforms in the right ovary. Other findings Trace free pelvic fluid. Study quality is degraded by patient body habitus. IMPRESSION: 1. Large central uterine fibroid measuring 7.6 cm, displacing the endometrium to the right. 2. Left oophorectomy. 3. Incompletely evaluated right adnexal region. There is suggestion of a tubular structure only on the transabdominal images, suggesting hydrosalpinx, pyosalpinx or tubo-ovarian abscess. 4. No evidence for torsion at this time. 5. Follow-up imaging is indicated. I would consider repeat pelvic imaging in 8-12 weeks. If the right adnexal region is incompletely evaluated at that time, MRI may be necessary. Electronically Signed   By: Nolon Nations M.D.   On: 07/21/2017 11:37    Ct Abdomen Pelvis W Contrast  Result Date: 07/21/2017 CLINICAL DATA:  Lower abdominal pain with nausea and diarrhea. Fever. EXAM: CT ABDOMEN AND PELVIS WITH CONTRAST TECHNIQUE: Multidetector CT imaging of the abdomen and pelvis was performed using the standard protocol following bolus administration of intravenous contrast. CONTRAST:  146mL ISOVUE-300 IOPAMIDOL (ISOVUE-300) INJECTION 61% COMPARISON:  None. FINDINGS: Lower chest: There is bibasilar atelectasis. Hepatobiliary: Liver measures 20.2 cm in length. No focal liver lesions are evident. Gallbladder wall is not appreciably thickened. There is no biliary duct dilatation. Pancreas: There is no pancreatic mass or inflammatory focus. Spleen: No splenic lesions are evident. There is a small splenule anterior and inferior to the spleen. Adrenals/Urinary Tract: Adrenals appear normal bilaterally. Kidneys bilaterally show no evident mass or hydronephrosis on either side. There is no renal or ureteral calculus. Urinary bladder is midline with wall thickness within normal limits. Stomach/Bowel: There is no appreciable bowel wall thickening. There is inflammatory change in the right lower quadrant which appears to arise from the right ovary region which extends to the level of the proximal at ascending colon is felt to be separate from the ascending colon. There is no evident bowel obstruction. No free air or portal venous air is evident. Vascular/Lymphatic: There is no abdominal aortic aneurysm. There is mild calcification in the common iliac arteries. Major mesenteric arteries appear normal. There is no evident adenopathy in the abdomen or pelvis. Reproductive: Uterus is anteverted. The uterus appears prominent, likely with leiomyomatous change. A well-defined leiomyomas not seen. The left ovary has been removed. The right ovary appears enlarged and somewhat complex in appearance measuring 6.6 x 5.8 x 5.0 cm in size. There is mesenteric inflammation throughout  this area extending to the level of the ascending colon on the right as well as extending across the midline anterior slightly superior to the uterus. There is loculated fluid surrounding the upper aspect of the uterus, apparently tracking from the right adnexal region. Other: The appendix appears normal, although the appendix is near the inflammation which appears to arise from the right ovary. AP well-defined abscess containing areas not seen on this study. There is a ventral hernia containing fat but no bowel. The neck of this hernia measures 2.6 cm from right to left dimension and 2.4 cm from superior to inferior dimension. There is a nearby area of fat along the anterior abdominal wall just anterior to the rectus muscle and just  superior to the ventral hernia. This presumed abdominal wall lipoma measures 3.3 x 2.3 cm. No ascites is evident in the abdomen or pelvis. Loculated fluid is noted along the anterior uterus as described above. Musculoskeletal: There is degenerative change in the lower thoracic and lumbar spine regions. There is moderately severe spinal stenosis at L4-5 due to diffuse bony hypertrophy and disc protrusion. There is moderate calcified central disc protrusion at L2-3. No blastic or lytic bone lesions are evident. There is no intramuscular or abdominal wall lesion. IMPRESSION: 1. The right ovary is enlarged with surrounding mesenteric thickening. Inflammation extends to the right, extending to the level of the proximal at ascending colon. Fluid and inflammation extends to the left along the superior aspect of the uterus, crossing the midline to the left of the uterus. This appearance raises concern for potential tubo-ovarian abscess. Right ovarian torsion is a differential consideration; correlation with pelvic ultrasound including Doppler evaluation is felt to be advisable in this circumstance. 2. Appendix is felt to be within normal limits. There is inflammation in the periappendiceal  region, felt to arise from the right ovary. 3. Prominent uterus with suspected leiomyomatous change, although a well-defined leiomyoma is not evident. 4.  Left ovary surgically absent. 5. No bowel obstruction. Mild inflammation along the wall of the ascending colon is felt to be due to inflammation arising from the right ovary. 6. Midline ventral hernia containing only fat. Just superior to this hernia, there is a benign lipoma in the anterior abdominal wall just superior to the hernia. 7.  Prominent liver without focal liver lesion. 8. No air-containing abscess seen in the abdomen or pelvis. No bowel obstruction. 9. Severe spinal stenosis at L4-5, multifactorial. Central calcified disc protrusion at L2-3 causing borderline narrowing of the thecal sac. 10.  No renal or ureteral calculus.  No hydronephrosis. Electronically Signed   By: Lowella Grip III M.D.   On: 07/21/2017 09:51   US Pelvic Doppler (torsion R/o Or Mass Arterial Flow)  Result Date: 07/21/2017 CLINICAL DATA:  Right-sided pelvic pain. Evaluate for possible torsion. Negative urine pregnancy test. EXAM: TRANSABDOMINAL AND TRANSVAGINAL ULTRASOUND OF PELVIS DOPPLER ULTRASOUND OF OVARIES TECHNIQUE: Both transabdominal and transvaginal ultrasound examinations of the pelvis were performed. Transabdominal technique was performed for global imaging of the pelvis including uterus, ovaries, adnexal regions, and pelvic cul-de-sac. It was necessary to proceed with endovaginal exam following the transabdominal exam to visualize the ovaries. Color and duplex Doppler ultrasound was utilized to evaluate blood flow to the ovaries. COMPARISON:  CT of the abdomen and pelvis on 07/21/2017 FINDINGS: Uterus Measurements: At least 11.7 x 7.4 x 7.1 cm. A large central heterogeneous uterine mass is 6.8 x 7.6 x 6.3 cm, compatible with fibroid. This mass displaces the endometrial canal to the right. Endometrium Thickness: 4.9 mm, displaced as described. No focal  abnormality visualized. Right ovary Measurements: 4.8 x 4.6 x 4.0 cm. On transabdominal images only, there is suggestion of a tubular structure within the right adnexa, discrete from the right ovary. This structure is not confirmed on the endovaginal portion of the exam follow-up would correlate well with the CT abnormality. Left ovary Measurements: Surgically absent. Pulsed Doppler evaluation of both ovaries demonstrates normal low-resistance arterial and venous waveforms in the right ovary. Other findings Trace free pelvic fluid. Study quality is degraded by patient body habitus. IMPRESSION: 1. Large central uterine fibroid measuring 7.6 cm, displacing the endometrium to the right. 2. Left oophorectomy. 3. Incompletely evaluated right adnexal region. There is suggestion  of a tubular structure only on the transabdominal images, suggesting hydrosalpinx, pyosalpinx or tubo-ovarian abscess. 4. No evidence for torsion at this time. 5. Follow-up imaging is indicated. I would consider repeat pelvic imaging in 8-12 weeks. If the right adnexal region is incompletely evaluated at that time, MRI may be necessary. Electronically Signed   By: Nolon Nations M.D.   On: 07/21/2017 11:37   Dg Chest Port 1 View  Result Date: 07/21/2017 CLINICAL DATA:  Lower abd pain with diarrhea and nausea since Wed. Diffuse abd pain with peritonitis -r/o free air. EXAM: PORTABLE CHEST 1 VIEW COMPARISON:  11/26/2013 FINDINGS: Heart size is accentuated by the portable technique. Shallow lung inflation accentuate bronchovascular markings. There are no focal consolidations or pleural effusions. No pulmonary edema. No evidence for free intraperitoneal air on this erect view of the chest. IMPRESSION: No evidence for acute cardiopulmonary abnormality. Electronically Signed   By: Nolon Nations M.D.   On: 07/21/2017 08:24    Ayanna Gheen, DO  [facility] Pager [phone]  If 7PM-7AM, please contact  night-coverage [email or web address] Password [email or web address] 07/22/2017, 9:14 AM   LOS: 1 day

## 2017-07-23 LAB — CBC
HCT: 37.9 % (ref 36.0–46.0)
HEMOGLOBIN: 12.3 g/dL (ref 12.0–15.0)
MCH: 31.5 pg (ref 26.0–34.0)
MCHC: 32.5 g/dL (ref 30.0–36.0)
MCV: 97.2 fL (ref 78.0–100.0)
PLATELETS: 273 10*3/uL (ref 150–400)
RBC: 3.9 MIL/uL (ref 3.87–5.11)
RDW: 13.6 % (ref 11.5–15.5)
WBC: 21.2 10*3/uL — ABNORMAL HIGH (ref 4.0–10.5)

## 2017-07-23 LAB — URINE CULTURE: Culture: 50000 — AB

## 2017-07-23 LAB — HEPATIC FUNCTION PANEL
ALBUMIN: 2.6 g/dL — AB (ref 3.5–5.0)
ALT: 16 U/L (ref 14–54)
AST: 14 U/L — AB (ref 15–41)
Alkaline Phosphatase: 83 U/L (ref 38–126)
Bilirubin, Direct: 0.3 mg/dL (ref 0.1–0.5)
Indirect Bilirubin: 0.9 mg/dL (ref 0.3–0.9)
TOTAL PROTEIN: 6.6 g/dL (ref 6.5–8.1)
Total Bilirubin: 1.2 mg/dL (ref 0.3–1.2)

## 2017-07-23 LAB — GLUCOSE, CAPILLARY
GLUCOSE-CAPILLARY: 146 mg/dL — AB (ref 65–99)
GLUCOSE-CAPILLARY: 171 mg/dL — AB (ref 65–99)
Glucose-Capillary: 187 mg/dL — ABNORMAL HIGH (ref 65–99)
Glucose-Capillary: 197 mg/dL — ABNORMAL HIGH (ref 65–99)

## 2017-07-23 LAB — LIPASE, BLOOD: Lipase: 19 U/L (ref 11–51)

## 2017-07-23 MED ORDER — PANTOPRAZOLE SODIUM 40 MG PO TBEC
40.0000 mg | DELAYED_RELEASE_TABLET | Freq: Once | ORAL | Status: AC
  Administered 2017-07-24: 40 mg via ORAL

## 2017-07-23 NOTE — Progress Notes (Signed)
Length of stay: 2 days   Assessment/Plan: Tubo ovarian abscess 6.6 x 5.8 x 5.0 cm Leiomyoma of the uterus 7 cm    Reviewed labs cultures and blood cultures are negative, WBC still elevated but improving and temperature curve also improving   I am Currently on cal in house at [facility] and will see patient in am If continues to improve should be able to be discharged on oral antibiotics  Tomorrow or 07/25/2017  If status worsens then will consult with IR regarding percutaneous drainage of the TOA         LOS: 2 days    Nicholl Onstott H 07/23/2017, 7:37 PM

## 2017-07-23 NOTE — Progress Notes (Signed)
PROGRESS NOTE  Amy Molina OBS:[id number] DOB: 05-20-1961 DOA: 07/21/2017 PCP: Everardo Beals, NP  Brief History:  56 y.o.femalewith medical history ofessential hypertension, diabetes mellitus, anxiety presented with 3-day history of fevers, chills and 1 day history of abdominal pain. The patient began experiencing fevers and chills up to 101.6 F on July 18, 2017. She has some associated dysuria. She went to urgent care on July 19, 2017. A small urine sample was collected, and the patient was discharged home with a prescription for Macrodantin. Unfortunately, the patient continued to have fevers up to 101.8 F on the evening of July 20, 2017. She began developing abdominal pain on the evening of July 20, 2017. She took some naproxen which helped. She began experiencing generalized malaise. As a result, the patient presents emergency department for further evaluation.  CT of the abdomen and pelvis revealed inflammatory changes in the right ovarian regionRLQto the level of the ascending colon. There was enlargement of the right ovary which revealed a complex appearance. There was mesenteric inflammation in this region extending to the ascending colon. There was loculated fluid in this region extending to the upper aspect of the uterus also crossing the midline to the left side of the uterus. Follow-up pelvic ultrasound revealed a tubular structure in the right adnexa suggesting tubo-ovarian abscess.  The patient was admitted and started on Merrem and doxycycline.  GYN, Dr. Elonda Husky was consulted.   Assessment/Plan: Tubo-ovarian abscess -Continue meropenem and doxycycline -GYNconsult appreciated -Blood cultures x2 sets -Lactic acid--1.6 -am CBC--WBC improving -now afebrile x 24 hours -follow blood cultures--neg to date  Abdominal pain -acute abd series--negative -start colace, senna-->BM  Personal history of penicillin allergy -Patient states  that she has takencephalexin in past without problems -tolerating Merrem without difficulty  Essential hypertension -Holding losartan secondary to soft blood pressure initially -BP remains controlled  Diabetes mellitus type 2, controlled -Hemoglobin A1c--7.1 -NovoLog sliding scale  Morbid obesity -BMI 52.4 -Lifestyle modification  Anxiety -Continue home dose alprazolam  Asthma -stable on room air -Albuterol as needed shortness of breath     Disposition Plan:   Home in 2-3 days if improves Family Communication:   Family at bedside updated 11/11  Consultants:  GYN  Code Status:  FULL   DVT Prophylaxis:  Lipan Lovenox   Procedures: As Listed in Progress Note Above  Antibiotics: None       Subjective: Patient states that abdominal pain is improving slowly.  She has intermittent nausea but states that her appetite is coming back.  She denies any vomiting, chest pain, shortness of breath, dysuria, hematuria, fevers, chills. Objective: Vitals:   07/22/17 0706 07/22/17 1507 07/23/17 0639 07/23/17 1052  BP: 128/62  (!) 128/54   Pulse: 87  89   Resp: 12  16   Temp: 98.3 F (36.8 C)  98.5 F (36.9 C)   TempSrc: Oral  Oral   SpO2: 95% (!) 88% 93% 91%  Weight:      Height:        Intake/Output Summary (Last 24 hours) at 07/23/2017 1322 Last data filed at 07/23/2017 0800 Gross per 24 hour  Intake 240 ml  Output -  Net 240 ml   Weight change:  Exam:   General:  Pt is alert, follows commands appropriately, not in acute distress  HEENT: No icterus, No thrush, No neck mass, [place]/AT  Cardiovascular: RRR, S1/S2, no rubs, no gallops  Respiratory: CTA bilaterally, no wheezing, no crackles,  no rhonchi  Abdomen: Soft/+BS, RLQ tender, non distended, no guarding  Extremities: No edema, No lymphangitis, No petechiae, No rashes, no synovitis   Data Reviewed: I have personally reviewed following labs and imaging studies Basic Metabolic  Panel: Recent Labs  Lab 07/21/17 0757 07/22/17 0751  NA 135 138  K 3.8 3.5  CL 99* 101  CO2 27 27  GLUCOSE 230* 147*  BUN 9 14  CREATININE 0.78 0.84  CALCIUM 8.6* 8.6*   Liver Function Tests: Recent Labs  Lab 07/21/17 0757 07/23/17 0614  AST 20 14*  ALT 24 16  ALKPHOS 94 83  BILITOT 2.1* 1.2  PROT 7.6 6.6  ALBUMIN 3.3* 2.6*   Recent Labs  Lab 07/21/17 0757 07/23/17 0614  LIPASE 31 19   No results for input(s): AMMONIA in the last 168 hours. Coagulation Profile: No results for input(s): INR, PROTIME in the last 168 hours. CBC: Recent Labs  Lab 07/21/17 0757 07/22/17 0751 07/23/17 0614  WBC 26.7* 24.8* 21.2*  NEUTROABS 22.8*  --   --   HGB 14.3 13.5 12.3  HCT 44.2 41.0 37.9  MCV 97.4 97.9 97.2  PLT 222 239 273   Cardiac Enzymes: No results for input(s): CKTOTAL, CKMB, CKMBINDEX, TROPONINI in the last 168 hours. BNP: Invalid input(s): POCBNP CBG: Recent Labs  Lab 07/22/17 0802 07/22/17 1112 07/22/17 1632 07/23/17 0719 07/23/17 1133  GLUCAP 122* 184* 216* 146* 187*   HbA1C: Recent Labs    07/22/17 0751  HGBA1C 7.1*   Urine analysis:    Component Value Date/Time   COLORURINE AMBER (A) 07/21/2017 0756   APPEARANCEUR HAZY (A) 07/21/2017 0756   LABSPEC 1.024 07/21/2017 0756   PHURINE 5.0 07/21/2017 0756   GLUCOSEU NEGATIVE 07/21/2017 0756   HGBUR NEGATIVE 07/21/2017 0756   BILIRUBINUR NEGATIVE 07/21/2017 0756   KETONESUR 5 (A) 07/21/2017 0756   PROTEINUR 30 (A) 07/21/2017 0756   UROBILINOGEN 1 10/13/2014 1548   NITRITE NEGATIVE 07/21/2017 0756   LEUKOCYTESUR NEGATIVE 07/21/2017 0756   Sepsis Labs: @LABRCNTIP (procalcitonin:4,lacticidven:4) ) Recent Results (from the past 240 hour(s))  Urine Culture     Status: Abnormal   Collection Time: 07/21/17  7:56 AM  Result Value Ref Range Status   Specimen Description URINE, CATHETERIZED  Final   Special Requests NONE  Final   Culture 50,000 COLONIES/mL KLEBSIELLA PNEUMONIAE (A)  Final    Report Status 07/23/2017 FINAL  Final   Organism ID, Bacteria KLEBSIELLA PNEUMONIAE (A)  Final      Susceptibility   Klebsiella pneumoniae - MIC*    AMPICILLIN >=32 RESISTANT Resistant     CEFAZOLIN <=4 SENSITIVE Sensitive     CEFTRIAXONE <=1 SENSITIVE Sensitive     CIPROFLOXACIN 0.5 SENSITIVE Sensitive     GENTAMICIN <=1 SENSITIVE Sensitive     IMIPENEM <=0.25 SENSITIVE Sensitive     NITROFURANTOIN 64 INTERMEDIATE Intermediate     TRIMETH/SULFA <=20 SENSITIVE Sensitive     AMPICILLIN/SULBACTAM 4 SENSITIVE Sensitive     PIP/TAZO 8 SENSITIVE Sensitive     Extended ESBL NEGATIVE Sensitive     * 50,000 COLONIES/mL KLEBSIELLA PNEUMONIAE  Wet prep, genital     Status: Abnormal   Collection Time: 07/21/17 10:08 AM  Result Value Ref Range Status   Yeast Wet Prep HPF POC NONE SEEN NONE SEEN Final   Trich, Wet Prep NONE SEEN NONE SEEN Final   Clue Cells Wet Prep HPF POC PRESENT (A) NONE SEEN Final   WBC, Wet Prep HPF POC MANY (A) NONE SEEN  Final   Sperm NONE SEEN  Final  Culture, blood (Routine X 2) w Reflex to ID Panel     Status: None (Preliminary result)   Collection Time: 07/21/17  3:06 PM  Result Value Ref Range Status   Specimen Description LEFT ANTECUBITAL  Final   Special Requests   Final    BOTTLES DRAWN AEROBIC AND ANAEROBIC Blood Culture results may not be optimal due to an inadequate volume of blood received in culture bottles   Culture NO GROWTH < 24 HOURS  Final   Report Status PENDING  Incomplete  Culture, blood (Routine X 2) w Reflex to ID Panel     Status: None (Preliminary result)   Collection Time: 07/21/17  3:06 PM  Result Value Ref Range Status   Specimen Description LEFT ANTECUBITAL  Final   Special Requests   Final    BOTTLES DRAWN AEROBIC AND ANAEROBIC Blood Culture results may not be optimal due to an inadequate volume of blood received in culture bottles   Culture NO GROWTH < 24 HOURS  Final   Report Status PENDING  Incomplete     Scheduled Meds: . aspirin  EC  81 mg Oral QHS  . doxycycline  100 mg Oral Q12H  . enoxaparin (LOVENOX) injection  70 mg Subcutaneous Q24H  . insulin aspart  0-5 Units Subcutaneous QHS  . insulin aspart  0-9 Units Subcutaneous TID WC  . loratadine  10 mg Oral Daily  . multivitamin with minerals  1 tablet Oral q morning - 10a  . polyethylene glycol  17 g Oral Daily  . senna  2 tablet Oral Daily   Continuous Infusions: . meropenem (MERREM) IV Stopped (07/23/17 1243)    Procedures/Studies: US Pelvis Transvanginal Non-ob (tv Only)  Result Date: 07/21/2017 CLINICAL DATA:  Right-sided pelvic pain. Evaluate for possible torsion. Negative urine pregnancy test. EXAM: TRANSABDOMINAL AND TRANSVAGINAL ULTRASOUND OF PELVIS DOPPLER ULTRASOUND OF OVARIES TECHNIQUE: Both transabdominal and transvaginal ultrasound examinations of the pelvis were performed. Transabdominal technique was performed for global imaging of the pelvis including uterus, ovaries, adnexal regions, and pelvic cul-de-sac. It was necessary to proceed with endovaginal exam following the transabdominal exam to visualize the ovaries. Color and duplex Doppler ultrasound was utilized to evaluate blood flow to the ovaries. COMPARISON:  CT of the abdomen and pelvis on 07/21/2017 FINDINGS: Uterus Measurements: At least 11.7 x 7.4 x 7.1 cm. A large central heterogeneous uterine mass is 6.8 x 7.6 x 6.3 cm, compatible with fibroid. This mass displaces the endometrial canal to the right. Endometrium Thickness: 4.9 mm, displaced as described. No focal abnormality visualized. Right ovary Measurements: 4.8 x 4.6 x 4.0 cm. On transabdominal images only, there is suggestion of a tubular structure within the right adnexa, discrete from the right ovary. This structure is not confirmed on the endovaginal portion of the exam follow-up would correlate well with the CT abnormality. Left ovary Measurements: Surgically absent. Pulsed Doppler evaluation of both ovaries demonstrates normal  low-resistance arterial and venous waveforms in the right ovary. Other findings Trace free pelvic fluid. Study quality is degraded by patient body habitus. IMPRESSION: 1. Large central uterine fibroid measuring 7.6 cm, displacing the endometrium to the right. 2. Left oophorectomy. 3. Incompletely evaluated right adnexal region. There is suggestion of a tubular structure only on the transabdominal images, suggesting hydrosalpinx, pyosalpinx or tubo-ovarian abscess. 4. No evidence for torsion at this time. 5. Follow-up imaging is indicated. I would consider repeat pelvic imaging in 8-12 weeks. If the right  adnexal region is incompletely evaluated at that time, MRI may be necessary. Electronically Signed   By: Nolon Nations M.D.   On: 07/21/2017 11:37   US Pelvis (transabdominal Only)  Result Date: 07/21/2017 CLINICAL DATA:  Right-sided pelvic pain. Evaluate for possible torsion. Negative urine pregnancy test. EXAM: TRANSABDOMINAL AND TRANSVAGINAL ULTRASOUND OF PELVIS DOPPLER ULTRASOUND OF OVARIES TECHNIQUE: Both transabdominal and transvaginal ultrasound examinations of the pelvis were performed. Transabdominal technique was performed for global imaging of the pelvis including uterus, ovaries, adnexal regions, and pelvic cul-de-sac. It was necessary to proceed with endovaginal exam following the transabdominal exam to visualize the ovaries. Color and duplex Doppler ultrasound was utilized to evaluate blood flow to the ovaries. COMPARISON:  CT of the abdomen and pelvis on 07/21/2017 FINDINGS: Uterus Measurements: At least 11.7 x 7.4 x 7.1 cm. A large central heterogeneous uterine mass is 6.8 x 7.6 x 6.3 cm, compatible with fibroid. This mass displaces the endometrial canal to the right. Endometrium Thickness: 4.9 mm, displaced as described. No focal abnormality visualized. Right ovary Measurements: 4.8 x 4.6 x 4.0 cm. On transabdominal images only, there is suggestion of a tubular structure within the right  adnexa, discrete from the right ovary. This structure is not confirmed on the endovaginal portion of the exam follow-up would correlate well with the CT abnormality. Left ovary Measurements: Surgically absent. Pulsed Doppler evaluation of both ovaries demonstrates normal low-resistance arterial and venous waveforms in the right ovary. Other findings Trace free pelvic fluid. Study quality is degraded by patient body habitus. IMPRESSION: 1. Large central uterine fibroid measuring 7.6 cm, displacing the endometrium to the right. 2. Left oophorectomy. 3. Incompletely evaluated right adnexal region. There is suggestion of a tubular structure only on the transabdominal images, suggesting hydrosalpinx, pyosalpinx or tubo-ovarian abscess. 4. No evidence for torsion at this time. 5. Follow-up imaging is indicated. I would consider repeat pelvic imaging in 8-12 weeks. If the right adnexal region is incompletely evaluated at that time, MRI may be necessary. Electronically Signed   By: Nolon Nations M.D.   On: 07/21/2017 11:37   Ct Abdomen Pelvis W Contrast  Result Date: 07/21/2017 CLINICAL DATA:  Lower abdominal pain with nausea and diarrhea. Fever. EXAM: CT ABDOMEN AND PELVIS WITH CONTRAST TECHNIQUE: Multidetector CT imaging of the abdomen and pelvis was performed using the standard protocol following bolus administration of intravenous contrast. CONTRAST:  141mL ISOVUE-300 IOPAMIDOL (ISOVUE-300) INJECTION 61% COMPARISON:  None. FINDINGS: Lower chest: There is bibasilar atelectasis. Hepatobiliary: Liver measures 20.2 cm in length. No focal liver lesions are evident. Gallbladder wall is not appreciably thickened. There is no biliary duct dilatation. Pancreas: There is no pancreatic mass or inflammatory focus. Spleen: No splenic lesions are evident. There is a small splenule anterior and inferior to the spleen. Adrenals/Urinary Tract: Adrenals appear normal bilaterally. Kidneys bilaterally show no evident mass or  hydronephrosis on either side. There is no renal or ureteral calculus. Urinary bladder is midline with wall thickness within normal limits. Stomach/Bowel: There is no appreciable bowel wall thickening. There is inflammatory change in the right lower quadrant which appears to arise from the right ovary region which extends to the level of the proximal at ascending colon is felt to be separate from the ascending colon. There is no evident bowel obstruction. No free air or portal venous air is evident. Vascular/Lymphatic: There is no abdominal aortic aneurysm. There is mild calcification in the common iliac arteries. Major mesenteric arteries appear normal. There is no evident adenopathy in the  abdomen or pelvis. Reproductive: Uterus is anteverted. The uterus appears prominent, likely with leiomyomatous change. A well-defined leiomyomas not seen. The left ovary has been removed. The right ovary appears enlarged and somewhat complex in appearance measuring 6.6 x 5.8 x 5.0 cm in size. There is mesenteric inflammation throughout this area extending to the level of the ascending colon on the right as well as extending across the midline anterior slightly superior to the uterus. There is loculated fluid surrounding the upper aspect of the uterus, apparently tracking from the right adnexal region. Other: The appendix appears normal, although the appendix is near the inflammation which appears to arise from the right ovary. AP well-defined abscess containing areas not seen on this study. There is a ventral hernia containing fat but no bowel. The neck of this hernia measures 2.6 cm from right to left dimension and 2.4 cm from superior to inferior dimension. There is a nearby area of fat along the anterior abdominal wall just anterior to the rectus muscle and just superior to the ventral hernia. This presumed abdominal wall lipoma measures 3.3 x 2.3 cm. No ascites is evident in the abdomen or pelvis. Loculated fluid is noted  along the anterior uterus as described above. Musculoskeletal: There is degenerative change in the lower thoracic and lumbar spine regions. There is moderately severe spinal stenosis at L4-5 due to diffuse bony hypertrophy and disc protrusion. There is moderate calcified central disc protrusion at L2-3. No blastic or lytic bone lesions are evident. There is no intramuscular or abdominal wall lesion. IMPRESSION: 1. The right ovary is enlarged with surrounding mesenteric thickening. Inflammation extends to the right, extending to the level of the proximal at ascending colon. Fluid and inflammation extends to the left along the superior aspect of the uterus, crossing the midline to the left of the uterus. This appearance raises concern for potential tubo-ovarian abscess. Right ovarian torsion is a differential consideration; correlation with pelvic ultrasound including Doppler evaluation is felt to be advisable in this circumstance. 2. Appendix is felt to be within normal limits. There is inflammation in the periappendiceal region, felt to arise from the right ovary. 3. Prominent uterus with suspected leiomyomatous change, although a well-defined leiomyoma is not evident. 4.  Left ovary surgically absent. 5. No bowel obstruction. Mild inflammation along the wall of the ascending colon is felt to be due to inflammation arising from the right ovary. 6. Midline ventral hernia containing only fat. Just superior to this hernia, there is a benign lipoma in the anterior abdominal wall just superior to the hernia. 7.  Prominent liver without focal liver lesion. 8. No air-containing abscess seen in the abdomen or pelvis. No bowel obstruction. 9. Severe spinal stenosis at L4-5, multifactorial. Central calcified disc protrusion at L2-3 causing borderline narrowing of the thecal sac. 10.  No renal or ureteral calculus.  No hydronephrosis. Electronically Signed   By: Lowella Grip III M.D.   On: 07/21/2017 09:51   US Pelvic  Doppler (torsion R/o Or Mass Arterial Flow)  Result Date: 07/21/2017 CLINICAL DATA:  Right-sided pelvic pain. Evaluate for possible torsion. Negative urine pregnancy test. EXAM: TRANSABDOMINAL AND TRANSVAGINAL ULTRASOUND OF PELVIS DOPPLER ULTRASOUND OF OVARIES TECHNIQUE: Both transabdominal and transvaginal ultrasound examinations of the pelvis were performed. Transabdominal technique was performed for global imaging of the pelvis including uterus, ovaries, adnexal regions, and pelvic cul-de-sac. It was necessary to proceed with endovaginal exam following the transabdominal exam to visualize the ovaries. Color and duplex Doppler ultrasound was utilized to  evaluate blood flow to the ovaries. COMPARISON:  CT of the abdomen and pelvis on 07/21/2017 FINDINGS: Uterus Measurements: At least 11.7 x 7.4 x 7.1 cm. A large central heterogeneous uterine mass is 6.8 x 7.6 x 6.3 cm, compatible with fibroid. This mass displaces the endometrial canal to the right. Endometrium Thickness: 4.9 mm, displaced as described. No focal abnormality visualized. Right ovary Measurements: 4.8 x 4.6 x 4.0 cm. On transabdominal images only, there is suggestion of a tubular structure within the right adnexa, discrete from the right ovary. This structure is not confirmed on the endovaginal portion of the exam follow-up would correlate well with the CT abnormality. Left ovary Measurements: Surgically absent. Pulsed Doppler evaluation of both ovaries demonstrates normal low-resistance arterial and venous waveforms in the right ovary. Other findings Trace free pelvic fluid. Study quality is degraded by patient body habitus. IMPRESSION: 1. Large central uterine fibroid measuring 7.6 cm, displacing the endometrium to the right. 2. Left oophorectomy. 3. Incompletely evaluated right adnexal region. There is suggestion of a tubular structure only on the transabdominal images, suggesting hydrosalpinx, pyosalpinx or tubo-ovarian abscess. 4. No evidence  for torsion at this time. 5. Follow-up imaging is indicated. I would consider repeat pelvic imaging in 8-12 weeks. If the right adnexal region is incompletely evaluated at that time, MRI may be necessary. Electronically Signed   By: Nolon Nations M.D.   On: 07/21/2017 11:37   Dg Chest Port 1 View  Result Date: 07/21/2017 CLINICAL DATA:  Lower abd pain with diarrhea and nausea since Wed. Diffuse abd pain with peritonitis -r/o free air. EXAM: PORTABLE CHEST 1 VIEW COMPARISON:  11/26/2013 FINDINGS: Heart size is accentuated by the portable technique. Shallow lung inflation accentuate bronchovascular markings. There are no focal consolidations or pleural effusions. No pulmonary edema. No evidence for free intraperitoneal air on this erect view of the chest. IMPRESSION: No evidence for acute cardiopulmonary abnormality. Electronically Signed   By: Nolon Nations M.D.   On: 07/21/2017 08:24   Dg Abd Acute W/chest  Result Date: 07/22/2017 CLINICAL DATA:  Lower abdominal pain and nausea.  History of asthma. EXAM: DG ABDOMEN ACUTE W/ 1V CHEST COMPARISON:  None. FINDINGS: There is no evidence of dilated bowel loops or free intraperitoneal air. No radiopaque calculi or other significant radiographic abnormality is seen. Heart size and mediastinal contours are within normal limits. Both lungs are clear. IMPRESSION: Negative abdominal radiographs.  No acute cardiopulmonary disease. Electronically Signed   By: Fidela Salisbury M.D.   On: 07/22/2017 14:20    Lavert Matousek, DO  [facility] Pager [phone]  If 7PM-7AM, please contact night-coverage [email or web address] Password [email or web address] 07/23/2017, 1:22 PM   LOS: 2 days

## 2017-07-24 DIAGNOSIS — D259 Leiomyoma of uterus, unspecified: Secondary | ICD-10-CM

## 2017-07-24 LAB — CBC
HCT: 38.8 % (ref 36.0–46.0)
HEMOGLOBIN: 12.6 g/dL (ref 12.0–15.0)
MCH: 31.6 pg (ref 26.0–34.0)
MCHC: 32.5 g/dL (ref 30.0–36.0)
MCV: 97.2 fL (ref 78.0–100.0)
Platelets: 300 10*3/uL (ref 150–400)
RBC: 3.99 MIL/uL (ref 3.87–5.11)
RDW: 13.4 % (ref 11.5–15.5)
WBC: 19.2 10*3/uL — ABNORMAL HIGH (ref 4.0–10.5)

## 2017-07-24 LAB — BASIC METABOLIC PANEL
ANION GAP: 8 (ref 5–15)
BUN: 9 mg/dL (ref 6–20)
CHLORIDE: 98 mmol/L — AB (ref 101–111)
CO2: 30 mmol/L (ref 22–32)
CREATININE: 0.7 mg/dL (ref 0.44–1.00)
Calcium: 8.3 mg/dL — ABNORMAL LOW (ref 8.9–10.3)
GFR calc non Af Amer: >60 mL/min (ref >60)
Glucose, Bld: 149 mg/dL — ABNORMAL HIGH (ref 65–99)
Potassium: 3.4 mmol/L — ABNORMAL LOW (ref 3.5–5.1)
Sodium: 136 mmol/L (ref 135–145)

## 2017-07-24 LAB — GLUCOSE, CAPILLARY
GLUCOSE-CAPILLARY: 155 mg/dL — AB (ref 65–99)
GLUCOSE-CAPILLARY: 188 mg/dL — AB (ref 65–99)
GLUCOSE-CAPILLARY: 190 mg/dL — AB (ref 65–99)
Glucose-Capillary: 163 mg/dL — ABNORMAL HIGH (ref 65–99)

## 2017-07-24 LAB — GC/CHLAMYDIA PROBE AMP (~~LOC~~) NOT AT ARMC
Chlamydia: NEGATIVE
Neisseria Gonorrhea: NEGATIVE

## 2017-07-24 MED ORDER — METRONIDAZOLE 500 MG PO TABS
500.0000 mg | ORAL_TABLET | Freq: Three times a day (TID) | ORAL | Status: DC
  Administered 2017-07-24 – 2017-07-26 (×7): 500 mg via ORAL
  Filled 2017-07-24 (×7): qty 1

## 2017-07-24 MED ORDER — POTASSIUM CHLORIDE CRYS ER 20 MEQ PO TBCR
20.0000 meq | EXTENDED_RELEASE_TABLET | Freq: Once | ORAL | Status: AC
  Administered 2017-07-24: 20 meq via ORAL
  Filled 2017-07-24: qty 1

## 2017-07-24 MED ORDER — PIPERACILLIN-TAZOBACTAM 3.375 G IVPB
3.3750 g | Freq: Three times a day (TID) | INTRAVENOUS | Status: DC
  Administered 2017-07-24 – 2017-07-26 (×7): 3.375 g via INTRAVENOUS
  Filled 2017-07-24 (×6): qty 50

## 2017-07-24 NOTE — Plan of Care (Signed)
Uneventful night as patient only required analgesics once duration of shift.

## 2017-07-24 NOTE — Progress Notes (Signed)
PROGRESS NOTE  Amy Molina OVF:[id number] DOB: [birth year] DOA: 07/21/2017 PCP: Everardo Beals, NP   Brief History: 56 y.o.femalewith medical history ofessential hypertension, diabetes mellitus, anxiety presented with 3-day history of fevers, chills and 1 day history of abdominal pain. The patient began experiencing fevers and chills up to 101.6 F on July 18, 2017. She has some associated dysuria. She went to urgent care on July 19, 2017. A small urine sample was collected, and the patient was discharged home with a prescription for Macrodantin. Unfortunately, the patient continued to have fevers up to 101.8 F on the evening of July 20, 2017. She began developing abdominal pain on the evening of July 20, 2017. She took some naproxen which helped. She began experiencing generalized malaise. As a result, the patient presents emergency department for further evaluation. CT of the abdomen and pelvis revealed inflammatory changes in the right ovarian regionRLQto the level of the ascending colon. There was enlargement of the right ovary which revealed a complex appearance. There was mesenteric inflammation in this region extending to the ascending colon. There was loculated fluid in this region extending to the upper aspect of the uterus also crossing the midline to the left side of the uterus. Follow-up pelvic ultrasound revealed a tubular structure in the right adnexa suggesting tubo-ovarian abscess.The patient was admitted and started on Merrem and doxycycline. GYN, Dr. Elonda Husky was consulted.   Assessment/Plan: Tubo-ovarian abscess -Continue meropenemand doxycycline--switched to zosyn/metronidazole 11/12 per GYN -GYNconsult appreciated -Blood cultures x2 sets--neg -Lactic acid--1.6 -am CBC--WBC improving -11/11--low grade temp-->overall temp curve is trending down  Abdominal pain -acute abd series--negative -start colace, senna-->BM -slowly  improving each day  Personal history of penicillin allergy -Patient states that she has takencephalexin in past without problems -tolerating Merrem without difficulty  Essential hypertension -Holding losartan secondary to soft blood pressureinitially -BP remains controlled  Diabetes mellitus type 2, controlled -Hemoglobin A1c--7.1 -NovoLog sliding scale  Morbid obesity -BMI 52.4 -Lifestyle modification  Anxiety -Continue home dose alprazolam  Asthma -stable on room air -Albuterol as needed shortness of breath    Disposition Plan: Home in 1-2daysif improves Family Communication: Family at bedside updated 11/12  Consultants:GYN  Code Status: FULL   DVT Prophylaxis: [place] Lovenox   Procedures: As Listed in Progress Note Above  Antibiotics: merrem 11/9>>>11/12 Doxy 11/9>>>11/12 Zosyn 11/12>>> Flagyl 11/12>>>     Subjective: Overall, her abdominal pain is improving.  She is able to tolerate her diet.  She has some nausea.  She denies any fevers, chills, chest pain, shortness breath, cough, hemoptysis, headache, neck pain, vomiting, diarrhea, hematochezia, melena.  Objective: Vitals:   07/23/17 2135 07/24/17 0022 07/24/17 0633 07/24/17 1300  BP: 131/70  137/64 130/60  Pulse: (!) 108  89 93  Resp: 20  14 16   Temp: (!) 100.7 F (38.2 C) 99.3 F (37.4 C) 98.7 F (37.1 C) 98.4 F (36.9 C)  TempSrc: Oral Oral Oral Oral  SpO2: 94%  92% 92%  Weight:      Height:        Intake/Output Summary (Last 24 hours) at 07/24/2017 1817 Last data filed at 07/24/2017 1756 Gross per 24 hour  Intake 970 ml  Output -  Net 970 ml   Weight change:  Exam:   General:  Pt is alert, follows commands appropriately, not in acute distress  HEENT: No icterus, No thrush, No neck mass, [place]/AT  Cardiovascular: RRR, S1/S2, no rubs, no gallops  Respiratory: CTA  bilaterally, no wheezing, no crackles, no rhonchi  Abdomen: Soft/+BS, RLQ tender, non  distended, no guarding  Extremities: trace LE edema, No lymphangitis, No petechiae, No rashes, no synovitis   Data Reviewed: I have personally reviewed following labs and imaging studies Basic Metabolic Panel: Recent Labs  Lab 07/21/17 0757 07/22/17 0751 07/24/17 0610  NA 135 138 136  K 3.8 3.5 3.4*  CL 99* 101 98*  CO2 27 27 30   GLUCOSE 230* 147* 149*  BUN 9 14 9   CREATININE 0.78 0.84 0.70  CALCIUM 8.6* 8.6* 8.3*   Liver Function Tests: Recent Labs  Lab 07/21/17 0757 07/23/17 0614  AST 20 14*  ALT 24 16  ALKPHOS 94 83  BILITOT 2.1* 1.2  PROT 7.6 6.6  ALBUMIN 3.3* 2.6*   Recent Labs  Lab 07/21/17 0757 07/23/17 0614  LIPASE 31 19   No results for input(s): AMMONIA in the last 168 hours. Coagulation Profile: No results for input(s): INR, PROTIME in the last 168 hours. CBC: Recent Labs  Lab 07/21/17 0757 07/22/17 0751 07/23/17 0614 07/24/17 0610  WBC 26.7* 24.8* 21.2* 19.2*  NEUTROABS 22.8*  --   --   --   HGB 14.3 13.5 12.3 12.6  HCT 44.2 41.0 37.9 38.8  MCV 97.4 97.9 97.2 97.2  PLT 222 239 273 300   Cardiac Enzymes: No results for input(s): CKTOTAL, CKMB, CKMBINDEX, TROPONINI in the last 168 hours. BNP: Invalid input(s): POCBNP CBG: Recent Labs  Lab 07/23/17 1616 07/23/17 2133 07/24/17 0721 07/24/17 1102 07/24/17 1628  GLUCAP 197* 171* 155* 188* 163*   HbA1C: Recent Labs    07/22/17 0751  HGBA1C 7.1*   Urine analysis:    Component Value Date/Time   COLORURINE AMBER (A) 07/21/2017 0756   APPEARANCEUR HAZY (A) 07/21/2017 0756   LABSPEC 1.024 07/21/2017 0756   PHURINE 5.0 07/21/2017 0756   GLUCOSEU NEGATIVE 07/21/2017 0756   HGBUR NEGATIVE 07/21/2017 0756   BILIRUBINUR NEGATIVE 07/21/2017 0756   KETONESUR 5 (A) 07/21/2017 0756   PROTEINUR 30 (A) 07/21/2017 0756   UROBILINOGEN 1 10/13/2014 1548   NITRITE NEGATIVE 07/21/2017 0756   LEUKOCYTESUR NEGATIVE 07/21/2017 0756   Sepsis  Labs: @LABRCNTIP (procalcitonin:4,lacticidven:4) ) Recent Results (from the past 240 hour(s))  Urine Culture     Status: Abnormal   Collection Time: 07/21/17  7:56 AM  Result Value Ref Range Status   Specimen Description URINE, CATHETERIZED  Final   Special Requests NONE  Final   Culture 50,000 COLONIES/mL KLEBSIELLA PNEUMONIAE (A)  Final   Report Status 07/23/2017 FINAL  Final   Organism ID, Bacteria KLEBSIELLA PNEUMONIAE (A)  Final      Susceptibility   Klebsiella pneumoniae - MIC*    AMPICILLIN >=32 RESISTANT Resistant     CEFAZOLIN <=4 SENSITIVE Sensitive     CEFTRIAXONE <=1 SENSITIVE Sensitive     CIPROFLOXACIN 0.5 SENSITIVE Sensitive     GENTAMICIN <=1 SENSITIVE Sensitive     IMIPENEM <=0.25 SENSITIVE Sensitive     NITROFURANTOIN 64 INTERMEDIATE Intermediate     TRIMETH/SULFA <=20 SENSITIVE Sensitive     AMPICILLIN/SULBACTAM 4 SENSITIVE Sensitive     PIP/TAZO 8 SENSITIVE Sensitive     Extended ESBL NEGATIVE Sensitive     * 50,000 COLONIES/mL KLEBSIELLA PNEUMONIAE  Wet prep, genital     Status: Abnormal   Collection Time: 07/21/17 10:08 AM  Result Value Ref Range Status   Yeast Wet Prep HPF POC NONE SEEN NONE SEEN Final   Trich, Wet Prep NONE SEEN NONE  SEEN Final   Clue Cells Wet Prep HPF POC PRESENT (A) NONE SEEN Final   WBC, Wet Prep HPF POC MANY (A) NONE SEEN Final   Sperm NONE SEEN  Final  Culture, blood (Routine X 2) w Reflex to ID Panel     Status: None (Preliminary result)   Collection Time: 07/21/17  3:06 PM  Result Value Ref Range Status   Specimen Description LEFT ANTECUBITAL  Final   Special Requests   Final    BOTTLES DRAWN AEROBIC AND ANAEROBIC Blood Culture results may not be optimal due to an inadequate volume of blood received in culture bottles   Culture NO GROWTH 3 DAYS  Final   Report Status PENDING  Incomplete  Culture, blood (Routine X 2) w Reflex to ID Panel     Status: None (Preliminary result)   Collection Time: 07/21/17  3:06 PM  Result Value  Ref Range Status   Specimen Description LEFT ANTECUBITAL  Final   Special Requests   Final    BOTTLES DRAWN AEROBIC AND ANAEROBIC Blood Culture results may not be optimal due to an inadequate volume of blood received in culture bottles   Culture NO GROWTH 3 DAYS  Final   Report Status PENDING  Incomplete     Scheduled Meds: . aspirin EC  81 mg Oral QHS  . enoxaparin (LOVENOX) injection  70 mg Subcutaneous Q24H  . insulin aspart  0-5 Units Subcutaneous QHS  . insulin aspart  0-9 Units Subcutaneous TID WC  . loratadine  10 mg Oral Daily  . metroNIDAZOLE  500 mg Oral Q8H  . multivitamin with minerals  1 tablet Oral q morning - 10a  . polyethylene glycol  17 g Oral Daily  . senna  2 tablet Oral Daily   Continuous Infusions: . piperacillin-tazobactam (ZOSYN)  IV 3.375 g (07/24/17 1543)    Procedures/Studies: US Pelvis Transvanginal Non-ob (tv Only)  Result Date: 07/21/2017 CLINICAL DATA:  Right-sided pelvic pain. Evaluate for possible torsion. Negative urine pregnancy test. EXAM: TRANSABDOMINAL AND TRANSVAGINAL ULTRASOUND OF PELVIS DOPPLER ULTRASOUND OF OVARIES TECHNIQUE: Both transabdominal and transvaginal ultrasound examinations of the pelvis were performed. Transabdominal technique was performed for global imaging of the pelvis including uterus, ovaries, adnexal regions, and pelvic cul-de-sac. It was necessary to proceed with endovaginal exam following the transabdominal exam to visualize the ovaries. Color and duplex Doppler ultrasound was utilized to evaluate blood flow to the ovaries. COMPARISON:  CT of the abdomen and pelvis on 07/21/2017 FINDINGS: Uterus Measurements: At least 11.7 x 7.4 x 7.1 cm. A large central heterogeneous uterine mass is 6.8 x 7.6 x 6.3 cm, compatible with fibroid. This mass displaces the endometrial canal to the right. Endometrium Thickness: 4.9 mm, displaced as described. No focal abnormality visualized. Right ovary Measurements: 4.8 x 4.6 x 4.0 cm. On  transabdominal images only, there is suggestion of a tubular structure within the right adnexa, discrete from the right ovary. This structure is not confirmed on the endovaginal portion of the exam follow-up would correlate well with the CT abnormality. Left ovary Measurements: Surgically absent. Pulsed Doppler evaluation of both ovaries demonstrates normal low-resistance arterial and venous waveforms in the right ovary. Other findings Trace free pelvic fluid. Study quality is degraded by patient body habitus. IMPRESSION: 1. Large central uterine fibroid measuring 7.6 cm, displacing the endometrium to the right. 2. Left oophorectomy. 3. Incompletely evaluated right adnexal region. There is suggestion of a tubular structure only on the transabdominal images, suggesting hydrosalpinx, pyosalpinx or tubo-ovarian  abscess. 4. No evidence for torsion at this time. 5. Follow-up imaging is indicated. I would consider repeat pelvic imaging in 8-12 weeks. If the right adnexal region is incompletely evaluated at that time, MRI may be necessary. Electronically Signed   By: Nolon Nations M.D.   On: 07/21/2017 11:37   US Pelvis (transabdominal Only)  Result Date: 07/21/2017 CLINICAL DATA:  Right-sided pelvic pain. Evaluate for possible torsion. Negative urine pregnancy test. EXAM: TRANSABDOMINAL AND TRANSVAGINAL ULTRASOUND OF PELVIS DOPPLER ULTRASOUND OF OVARIES TECHNIQUE: Both transabdominal and transvaginal ultrasound examinations of the pelvis were performed. Transabdominal technique was performed for global imaging of the pelvis including uterus, ovaries, adnexal regions, and pelvic cul-de-sac. It was necessary to proceed with endovaginal exam following the transabdominal exam to visualize the ovaries. Color and duplex Doppler ultrasound was utilized to evaluate blood flow to the ovaries. COMPARISON:  CT of the abdomen and pelvis on 07/21/2017 FINDINGS: Uterus Measurements: At least 11.7 x 7.4 x 7.1 cm. A large central  heterogeneous uterine mass is 6.8 x 7.6 x 6.3 cm, compatible with fibroid. This mass displaces the endometrial canal to the right. Endometrium Thickness: 4.9 mm, displaced as described. No focal abnormality visualized. Right ovary Measurements: 4.8 x 4.6 x 4.0 cm. On transabdominal images only, there is suggestion of a tubular structure within the right adnexa, discrete from the right ovary. This structure is not confirmed on the endovaginal portion of the exam follow-up would correlate well with the CT abnormality. Left ovary Measurements: Surgically absent. Pulsed Doppler evaluation of both ovaries demonstrates normal low-resistance arterial and venous waveforms in the right ovary. Other findings Trace free pelvic fluid. Study quality is degraded by patient body habitus. IMPRESSION: 1. Large central uterine fibroid measuring 7.6 cm, displacing the endometrium to the right. 2. Left oophorectomy. 3. Incompletely evaluated right adnexal region. There is suggestion of a tubular structure only on the transabdominal images, suggesting hydrosalpinx, pyosalpinx or tubo-ovarian abscess. 4. No evidence for torsion at this time. 5. Follow-up imaging is indicated. I would consider repeat pelvic imaging in 8-12 weeks. If the right adnexal region is incompletely evaluated at that time, MRI may be necessary. Electronically Signed   By: Nolon Nations M.D.   On: 07/21/2017 11:37   Ct Abdomen Pelvis W Contrast  Result Date: 07/21/2017 CLINICAL DATA:  Lower abdominal pain with nausea and diarrhea. Fever. EXAM: CT ABDOMEN AND PELVIS WITH CONTRAST TECHNIQUE: Multidetector CT imaging of the abdomen and pelvis was performed using the standard protocol following bolus administration of intravenous contrast. CONTRAST:  177mL ISOVUE-300 IOPAMIDOL (ISOVUE-300) INJECTION 61% COMPARISON:  None. FINDINGS: Lower chest: There is bibasilar atelectasis. Hepatobiliary: Liver measures 20.2 cm in length. No focal liver lesions are evident.  Gallbladder wall is not appreciably thickened. There is no biliary duct dilatation. Pancreas: There is no pancreatic mass or inflammatory focus. Spleen: No splenic lesions are evident. There is a small splenule anterior and inferior to the spleen. Adrenals/Urinary Tract: Adrenals appear normal bilaterally. Kidneys bilaterally show no evident mass or hydronephrosis on either side. There is no renal or ureteral calculus. Urinary bladder is midline with wall thickness within normal limits. Stomach/Bowel: There is no appreciable bowel wall thickening. There is inflammatory change in the right lower quadrant which appears to arise from the right ovary region which extends to the level of the proximal at ascending colon is felt to be separate from the ascending colon. There is no evident bowel obstruction. No free air or portal venous air is evident. Vascular/Lymphatic: There  is no abdominal aortic aneurysm. There is mild calcification in the common iliac arteries. Major mesenteric arteries appear normal. There is no evident adenopathy in the abdomen or pelvis. Reproductive: Uterus is anteverted. The uterus appears prominent, likely with leiomyomatous change. A well-defined leiomyomas not seen. The left ovary has been removed. The right ovary appears enlarged and somewhat complex in appearance measuring 6.6 x 5.8 x 5.0 cm in size. There is mesenteric inflammation throughout this area extending to the level of the ascending colon on the right as well as extending across the midline anterior slightly superior to the uterus. There is loculated fluid surrounding the upper aspect of the uterus, apparently tracking from the right adnexal region. Other: The appendix appears normal, although the appendix is near the inflammation which appears to arise from the right ovary. AP well-defined abscess containing areas not seen on this study. There is a ventral hernia containing fat but no bowel. The neck of this hernia measures 2.6 cm  from right to left dimension and 2.4 cm from superior to inferior dimension. There is a nearby area of fat along the anterior abdominal wall just anterior to the rectus muscle and just superior to the ventral hernia. This presumed abdominal wall lipoma measures 3.3 x 2.3 cm. No ascites is evident in the abdomen or pelvis. Loculated fluid is noted along the anterior uterus as described above. Musculoskeletal: There is degenerative change in the lower thoracic and lumbar spine regions. There is moderately severe spinal stenosis at L4-5 due to diffuse bony hypertrophy and disc protrusion. There is moderate calcified central disc protrusion at L2-3. No blastic or lytic bone lesions are evident. There is no intramuscular or abdominal wall lesion. IMPRESSION: 1. The right ovary is enlarged with surrounding mesenteric thickening. Inflammation extends to the right, extending to the level of the proximal at ascending colon. Fluid and inflammation extends to the left along the superior aspect of the uterus, crossing the midline to the left of the uterus. This appearance raises concern for potential tubo-ovarian abscess. Right ovarian torsion is a differential consideration; correlation with pelvic ultrasound including Doppler evaluation is felt to be advisable in this circumstance. 2. Appendix is felt to be within normal limits. There is inflammation in the periappendiceal region, felt to arise from the right ovary. 3. Prominent uterus with suspected leiomyomatous change, although a well-defined leiomyoma is not evident. 4.  Left ovary surgically absent. 5. No bowel obstruction. Mild inflammation along the wall of the ascending colon is felt to be due to inflammation arising from the right ovary. 6. Midline ventral hernia containing only fat. Just superior to this hernia, there is a benign lipoma in the anterior abdominal wall just superior to the hernia. 7.  Prominent liver without focal liver lesion. 8. No air-containing  abscess seen in the abdomen or pelvis. No bowel obstruction. 9. Severe spinal stenosis at L4-5, multifactorial. Central calcified disc protrusion at L2-3 causing borderline narrowing of the thecal sac. 10.  No renal or ureteral calculus.  No hydronephrosis. Electronically Signed   By: Lowella Grip III M.D.   On: 07/21/2017 09:51   US Pelvic Doppler (torsion R/o Or Mass Arterial Flow)  Result Date: 07/21/2017 CLINICAL DATA:  Right-sided pelvic pain. Evaluate for possible torsion. Negative urine pregnancy test. EXAM: TRANSABDOMINAL AND TRANSVAGINAL ULTRASOUND OF PELVIS DOPPLER ULTRASOUND OF OVARIES TECHNIQUE: Both transabdominal and transvaginal ultrasound examinations of the pelvis were performed. Transabdominal technique was performed for global imaging of the pelvis including uterus, ovaries, adnexal regions, and  pelvic cul-de-sac. It was necessary to proceed with endovaginal exam following the transabdominal exam to visualize the ovaries. Color and duplex Doppler ultrasound was utilized to evaluate blood flow to the ovaries. COMPARISON:  CT of the abdomen and pelvis on 07/21/2017 FINDINGS: Uterus Measurements: At least 11.7 x 7.4 x 7.1 cm. A large central heterogeneous uterine mass is 6.8 x 7.6 x 6.3 cm, compatible with fibroid. This mass displaces the endometrial canal to the right. Endometrium Thickness: 4.9 mm, displaced as described. No focal abnormality visualized. Right ovary Measurements: 4.8 x 4.6 x 4.0 cm. On transabdominal images only, there is suggestion of a tubular structure within the right adnexa, discrete from the right ovary. This structure is not confirmed on the endovaginal portion of the exam follow-up would correlate well with the CT abnormality. Left ovary Measurements: Surgically absent. Pulsed Doppler evaluation of both ovaries demonstrates normal low-resistance arterial and venous waveforms in the right ovary. Other findings Trace free pelvic fluid. Study quality is degraded by  patient body habitus. IMPRESSION: 1. Large central uterine fibroid measuring 7.6 cm, displacing the endometrium to the right. 2. Left oophorectomy. 3. Incompletely evaluated right adnexal region. There is suggestion of a tubular structure only on the transabdominal images, suggesting hydrosalpinx, pyosalpinx or tubo-ovarian abscess. 4. No evidence for torsion at this time. 5. Follow-up imaging is indicated. I would consider repeat pelvic imaging in 8-12 weeks. If the right adnexal region is incompletely evaluated at that time, MRI may be necessary. Electronically Signed   By: Nolon Nations M.D.   On: 07/21/2017 11:37   Dg Chest Port 1 View  Result Date: 07/21/2017 CLINICAL DATA:  Lower abd pain with diarrhea and nausea since Wed. Diffuse abd pain with peritonitis -r/o free air. EXAM: PORTABLE CHEST 1 VIEW COMPARISON:  11/26/2013 FINDINGS: Heart size is accentuated by the portable technique. Shallow lung inflation accentuate bronchovascular markings. There are no focal consolidations or pleural effusions. No pulmonary edema. No evidence for free intraperitoneal air on this erect view of the chest. IMPRESSION: No evidence for acute cardiopulmonary abnormality. Electronically Signed   By: Nolon Nations M.D.   On: 07/21/2017 08:24   Dg Abd Acute W/chest  Result Date: 07/22/2017 CLINICAL DATA:  Lower abdominal pain and nausea.  History of asthma. EXAM: DG ABDOMEN ACUTE W/ 1V CHEST COMPARISON:  None. FINDINGS: There is no evidence of dilated bowel loops or free intraperitoneal air. No radiopaque calculi or other significant radiographic abnormality is seen. Heart size and mediastinal contours are within normal limits. Both lungs are clear. IMPRESSION: Negative abdominal radiographs.  No acute cardiopulmonary disease. Electronically Signed   By: Fidela Salisbury M.D.   On: 07/22/2017 14:20    Daisean Brodhead, DO  [facility] Pager [phone]  If 7PM-7AM, please contact  night-coverage [email or web address] Password [email or web address] 07/24/2017, 6:17 PM   LOS: 3 days

## 2017-07-24 NOTE — Progress Notes (Signed)
Patient ID: Amy Molina, female   DOB: [birth year], 56 y.o.   MRN: [id number]  Request for TOA drain placement  Reviewed with Dr Kathlene Cote: No discreet abscess noted on CT or [place] No indication for drain  Will inform MD

## 2017-07-24 NOTE — Progress Notes (Signed)
Amy Molina is a 56 y.o. female patient. Pt feels better but she still has pain in the RLQ Tolerating diet, no N/V  1. Tubo-ovarian abscess   2. RLQ abdominal pain   3. Abdominal pain   4. TOA (tubo-ovarian abscess)    Past Medical History:  Diagnosis Date  . Anxiety   . Arthritis    general stiffness of joints  . Asthma   . Diabetes mellitus ([place])   . Heart murmur    childhood years- no mention in adult yrs  . Hypertension   . Morbid obesity ([place])   . Seasonal allergies    Current Facility-Administered Medications  Medication Dose Route Frequency Provider Last Rate Last Dose  . acetaminophen (TYLENOL) tablet 650 mg  650 mg Oral Q6H PRN Tat, Shanon Brow, MD   650 mg at 07/21/17 2317   Or  . acetaminophen (TYLENOL) suppository 650 mg  650 mg Rectal Q6H PRN Tat, Shanon Brow, MD      . albuterol (PROVENTIL) (2.5 MG/3ML) 0.083% nebulizer solution 2.5 mg  2.5 mg Nebulization Q4H PRN Tat, David, MD      . ALPRAZolam Duanne Moron) tablet 0.5 mg  0.5 mg Oral TID PRN Orson Eva, MD   0.5 mg at 07/24/17 1112  . aspirin EC tablet 81 mg  81 mg Oral Benay Pike, MD   81 mg at 07/23/17 2228  . enoxaparin (LOVENOX) injection 70 mg  70 mg Subcutaneous Q24H Tat, Shanon Brow, MD   70 mg at 07/23/17 1725  . HYDROmorphone (DILAUDID) injection 0.5 mg  0.5 mg Intravenous Q4H PRN Tat, Shanon Brow, MD   0.5 mg at 07/24/17 1104  . insulin aspart (novoLOG) injection 0-5 Units  0-5 Units Subcutaneous QHS Tat, David, MD      . insulin aspart (novoLOG) injection 0-9 Units  0-9 Units Subcutaneous TID WC Orson Eva, MD   2 Units at 07/24/17 1158  . loratadine (CLARITIN) tablet 10 mg  10 mg Oral Daily Tat, David, MD   10 mg at 07/24/17 1106  . metroNIDAZOLE (FLAGYL) tablet 500 mg  500 mg Oral Q8H Florian Buff, MD      . multivitamin with minerals tablet 1 tablet  1 tablet Oral q morning - [place] Silos, MD   1 tablet at 07/24/17 1106  . ondansetron (ZOFRAN) tablet 4 mg  4 mg Oral Q6H PRN Tat, David, MD   4 mg at 07/23/17 5366   Or   . ondansetron (ZOFRAN) injection 4 mg  4 mg Intravenous Q6H PRN Tat, Shanon Brow, MD      . oxyCODONE (Oxy IR/ROXICODONE) immediate release tablet 5 mg  5 mg Oral Q4H PRN Orson Eva, MD   5 mg at 07/24/17 0804  . piperacillin-tazobactam (ZOSYN) IVPB 3.375 g  3.375 g Intravenous Q8H Florian Buff, MD      . polyethylene glycol (MIRALAX / GLYCOLAX) packet 17 g  17 g Oral Daily Tat, David, MD   17 g at 07/23/17 0839  . senna (SENOKOT) tablet 17.2 mg  2 tablet Oral Daily Tat, David, MD   17.2 mg at 07/24/17 1106   Allergies  Allergen Reactions  . Penicillins Swelling    Has patient had a PCN reaction causing immediate rash, facial/tongue/throat swelling, SOB or lightheadedness with hypotension: Yes Has patient had a PCN reaction causing severe rash involving mucus membranes or skin necrosis: No Has patient had a PCN reaction that required hospitalization Yes Has patient had a PCN reaction occurring within the last  10 years: Yes If all of the above answers are "NO", then may proceed with Cephalosporin use.   . Floxin [Ofloxacin]     MIGRAINES, INSOMNIA, PARANOIND, RINGING IN EARS  . Lipitor [Atorvastatin]     Severe myalgias   Active Problems:   Hypertension   Obesity, morbid ([place])   Tubo-ovarian abscess   Uncontrolled type 2 diabetes mellitus with hyperglycemia ([facility])   Abdominal pain   [name]  Blood pressure 130/60, pulse 93, temperature 98.4 F (36.9 C), temperature source Oral, resp. rate 16, height 5\' 5"  (1.651 m), weight (!) 311 lb 4.8 oz (141.2 kg), last menstrual period 08/09/2016, SpO2 92 %.  Subjective  See above  Objective  Gen WDWN female NAD abd soft obese moderate tenderness RLQ, no rebound  CBC Latest Ref Rng & Units 07/24/2017 07/23/2017 07/22/2017  WBC 4.0 - 10.5 K/uL 19.2(H) 21.2(H) 24.8(H)  Hemoglobin 12.0 - 15.0 g/dL 12.6 12.3 13.5  Hematocrit 36.0 - 46.0 % 38.8 37.9 41.0  Platelets 150 - 400 K/uL 300 273 239     Assessment &  Plan TOA/Right pyosalpinx: Pt WBC improvi9ng slightly, clinically improved but had a temperature elevation last night to 100.7 Will switch antibiotics to zosyn/flagyl IR states no definitive abscess to drain, more of a phlegmon If defervesces will discharge on prolonged outpatient antibiotics, hopefully in the next day or so  EURE,LUTHER H 07/24/2017 Patient ID: Amy Molina, female   DOB: Nov 29, 1960, 57 y.o.   MRN: [id number]

## 2017-07-25 LAB — CBC
HEMATOCRIT: 38.7 % (ref 36.0–46.0)
HEMOGLOBIN: 12.5 g/dL (ref 12.0–15.0)
MCH: 31.4 pg (ref 26.0–34.0)
MCHC: 32.3 g/dL (ref 30.0–36.0)
MCV: 97.2 fL (ref 78.0–100.0)
Platelets: 304 10*3/uL (ref 150–400)
RBC: 3.98 MIL/uL (ref 3.87–5.11)
RDW: 13.3 % (ref 11.5–15.5)
WBC: 15.1 10*3/uL — AB (ref 4.0–10.5)

## 2017-07-25 LAB — GLUCOSE, CAPILLARY
GLUCOSE-CAPILLARY: 163 mg/dL — AB (ref 65–99)
GLUCOSE-CAPILLARY: 167 mg/dL — AB (ref 65–99)
Glucose-Capillary: 131 mg/dL — ABNORMAL HIGH (ref 65–99)
Glucose-Capillary: 129 mg/dL — ABNORMAL HIGH (ref 65–99)

## 2017-07-25 LAB — BASIC METABOLIC PANEL
Anion gap: 8 (ref 5–15)
BUN: 10 mg/dL (ref 6–20)
CHLORIDE: 98 mmol/L — AB (ref 101–111)
CO2: 30 mmol/L (ref 22–32)
Calcium: 8.2 mg/dL — ABNORMAL LOW (ref 8.9–10.3)
Creatinine, Ser: 0.61 mg/dL (ref 0.44–1.00)
GFR calc non Af Amer: >60 mL/min (ref >60)
Glucose, Bld: 136 mg/dL — ABNORMAL HIGH (ref 65–99)
POTASSIUM: 3.5 mmol/L (ref 3.5–5.1)
SODIUM: 136 mmol/L (ref 135–145)

## 2017-07-25 LAB — MAGNESIUM: Magnesium: 1.8 mg/dL (ref 1.7–2.4)

## 2017-07-25 MED ORDER — MOMETASONE FURO-FORMOTEROL FUM 200-5 MCG/ACT IN AERO
2.0000 | INHALATION_SPRAY | Freq: Two times a day (BID) | RESPIRATORY_TRACT | Status: DC
  Administered 2017-07-25 – 2017-07-26 (×2): 2 via RESPIRATORY_TRACT
  Filled 2017-07-25: qty 8.8

## 2017-07-25 MED ORDER — IPRATROPIUM-ALBUTEROL 0.5-2.5 (3) MG/3ML IN SOLN: 3.0000 mL | RESPIRATORY_TRACT | Status: DC | PRN

## 2017-07-25 MED ORDER — IPRATROPIUM-ALBUTEROL 0.5-2.5 (3) MG/3ML IN SOLN
3.0000 mL | Freq: Once | RESPIRATORY_TRACT | Status: AC
  Administered 2017-07-25: 3 mL via RESPIRATORY_TRACT
  Filled 2017-07-25: qty 3

## 2017-07-25 NOTE — Progress Notes (Signed)
Labs and vitals reviewed Agree with Dr Tat as outlined, if WBC and temperature curve continue as they have will discharge tomorrow  I will see patient in am and probably discharge her then on oral antibiotics for another 2 weeks and arrange follow up in my office  Florian Buff, MD 07/25/2017 3:46 PM

## 2017-07-25 NOTE — Plan of Care (Signed)
Pt rested without signs of distress.

## 2017-07-25 NOTE — Evaluation (Signed)
Physical Therapy Evaluation Patient Details Name: Amy Molina MRN: [id number] DOB: 27-Mar-1961 Today's Date: 07/25/2017   History of Present Illness  Amy Molina is a 56 y.o. female with medical history of essential hypertension, diabetes mellitus, anxiety presented with 3-day history of fevers, chills and 1 day history of abdominal pain.  The patient began experiencing fevers and chills up to 101.6 F on July 18, 2017.  She has some associated dysuria.  She went to urgent care on July 19, 2017.  A small urine sample was collected, and the patient was discharged home with a prescription for Macrodantin.  Unfortunately, the patient continued to have fevers up to 101.8 F on the evening of July 20, 2017.  She began developing abdominal pain on the evening of July 20, 2017.  She took some naproxen which helped.  She began experiencing generalized malaise.  As a result, the patient presents emergency department for further evaluation.  She had some nausea without emesis  Clinical Impression  PT normally ambulates without assistive device.  PT was able to complete bed mobility and ambulation with mod I.  Therapist does not recommend skilled PT.  PT urged pt to begin walking three times a day increasing distance each time to improve endurance.     Follow Up Recommendations No PT follow up    Equipment Recommendations  None recommended by PT    Recommendations for Other Services   none    Precautions / Restrictions Precautions Precautions: None Restrictions Weight Bearing Restrictions: No      Mobility  Bed Mobility Overal bed mobility: Modified Independent             General bed mobility comments: Able to sit on side of bed and Don socks  Transfers Overall transfer level: Modified independent Equipment used: None                Ambulation/Gait Ambulation/Gait assistance: Modified independent (Device/Increase time) Ambulation Distance (Feet): 70  Feet Assistive device: None Gait Pattern/deviations: Decreased step length - right;Decreased step length - left   Gait velocity interpretation: <1.8 ft/sec, indicative of risk for recurrent falls    Stairs            Wheelchair Mobility    Modified Rankin (Stroke Patients Only)       Balance                                             Pertinent Vitals/Pain Pain Assessment: No/denies pain    Home Living Family/patient expects to be discharged to:: Private residence Living Arrangements: Other relatives Available Help at Discharge: Family Type of Home: House Home Access: Stairs to enter   Technical brewer of Steps: 3 Home Layout: One level Home Equipment: None      Prior Function Level of Independence: Independent               Hand Dominance        Extremity/Trunk Assessment        Lower Extremity Assessment Lower Extremity Assessment: Overall WFL for tasks assessed       Communication   Communication: No difficulties  Cognition Arousal/Alertness: Awake/alert Behavior During Therapy: WFL for tasks assessed/performed Overall Cognitive Status: Within Functional Limits for tasks assessed  General Comments      Exercises     Assessment/Plan    PT Assessment Patent does not need any further PT services  PT Problem List         PT Treatment Interventions      PT Goals (Current goals can be found in the Care Plan section)  Acute Rehab PT Goals Patient Stated Goal: To go home;   PT Goal Formulation: With patient Potential to Achieve Goals: Good    Frequency     Barriers to discharge        Co-evaluation               AM-PAC PT "6 Clicks" Daily Activity  Outcome Measure                  End of Session Equipment Utilized During Treatment: Gait belt Activity Tolerance: Patient tolerated treatment well Patient left: in chair;with  family/visitor present Nurse Communication: Mobility status PT Visit Diagnosis: Unsteadiness on feet (R26.81)    Time: 1130-1150 PT Time Calculation (min) (ACUTE ONLY): 20 min   Charges:   PT Evaluation $PT Eval Low Complexity: [place], PT CLT [phone] 07/25/2017, 11:56 AM

## 2017-07-25 NOTE — Progress Notes (Signed)
PROGRESS NOTE  Amy Molina ENM:[id number] DOB: 11/26/1960 DOA: 07/21/2017 PCP: Everardo Beals, NP  Brief History: 56 y.o.femalewith medical history ofessential hypertension, diabetes mellitus, anxiety presented with 3-day history of fevers, chills and 1 day history of abdominal pain. The patient began experiencing fevers and chills up to 101.6 F on July 18, 2017. She has some associated dysuria. She went to urgent care on July 19, 2017. A small urine sample was collected, and the patient was discharged home with a prescription for Macrodantin. Unfortunately, the patient continued to have fevers up to 101.8 F on the evening of July 20, 2017. She began developing abdominal pain on the evening of July 20, 2017. She took some naproxen which helped. She began experiencing generalized malaise. As a result, the patient presents emergency department for further evaluation. CT of the abdomen and pelvis revealed inflammatory changes in the right ovarian regionRLQto the level of the ascending colon. There was enlargement of the right ovary which revealed a complex appearance. There was mesenteric inflammation in this region extending to the ascending colon. There was loculated fluid in this region extending to the upper aspect of the uterus also crossing the midline to the left side of the uterus. Follow-up pelvic ultrasound revealed a tubular structure in the right adnexa suggesting tubo-ovarian abscess.The patient was admitted and started on Merrem and doxycycline. GYN, Dr. Elonda Husky was consulted.   Assessment/Plan: Tubo-ovarian abscess -Initially on meropenemand doxycycline--switched to zosyn/metronidazole 11/12 per GYN -GYNconsultappreciated -Blood cultures x2 sets--neg -Lactic acid--1.6 -am CBC--WBCimproving -11/12--low grade temp-->overall temp curve is trending down (101.8>>>100.5) -plan to d/c home with levofloxacin/Flagyl x 14 more  days  Abdominal pain -acute abd series--negative -start colace, senna-->BM -slowly improving each day  Personal history of penicillin allergy -Patient states that she has takencephalexin in past without problems -tolerating Merrem/Zosyn without difficulty  Essential hypertension -Holding losartan secondary to soft blood pressureinitially -BP remains controlled  Diabetes mellitus type 2, controlled -Hemoglobin A1c--7.1 -NovoLog sliding scale  Morbid obesity -BMI 52.4 -Lifestyle modification  Anxiety -Continue home dose alprazolam  Asthma -stable on room air -Albuterol as needed shortness of breath    Disposition Plan: Home 11/14 if WBC and fever continued to improve Family Communication: Family at bedside updated 11/13  Consultants:GYN  Code Status: FULL   DVT Prophylaxis: [place] Lovenox   Procedures: As Listed in Progress Note Above  Antibiotics: merrem 11/9>>>11/12 Doxy 11/9>>>11/12 Zosyn 11/12>>> Flagyl 11/12>>>     Subjective: Overall abdominal pain continues to improve.  She denies any nausea, vomiting, diarrhea, chest pain, shortness of breath.  She does have some intermittent wheezing.  Denies any fevers, chills, headache, neck pain.  Objective: Vitals:   07/24/17 2111 07/25/17 0532 07/25/17 1000 07/25/17 1500  BP: 131/82 (!) 149/78  134/63  Pulse: 96 90  82  Resp: 16 16  20   Temp: (!) 100.5 F (38.1 C) 99 F (37.2 C) 98.1 F (36.7 C) 98.2 F (36.8 C)  TempSrc: Oral Oral  Oral  SpO2: 96% 94%  95%  Weight:      Height:        Intake/Output Summary (Last 24 hours) at 07/25/2017 1528 Last data filed at 07/25/2017 0900 Gross per 24 hour  Intake 530 ml  Output -  Net 530 ml   Weight change:  Exam:   General:  Pt is alert, follows commands appropriately, not in acute distress  HEENT: No icterus, No thrush, No neck mass, [place]/AT  Cardiovascular:  RRR, S1/S2, no rubs, no gallops  Respiratory: Bibasilar rales  without wheezing.  Good air movement.  Abdomen: Soft/+BS, RLQ tender, non distended, no guarding  Extremities: trace LE edema, No lymphangitis, No petechiae, No rashes, no synovitis   Data Reviewed: I have personally reviewed following labs and imaging studies Basic Metabolic Panel: Recent Labs  Lab 07/21/17 0757 07/22/17 0751 07/24/17 0610 07/25/17 0451  NA 135 138 136 136  K 3.8 3.5 3.4* 3.5  CL 99* 101 98* 98*  CO2 27 27 30 30   GLUCOSE 230* 147* 149* 136*  BUN 9 14 9 10   CREATININE 0.78 0.84 0.70 0.61  CALCIUM 8.6* 8.6* 8.3* 8.2*  MG  --   --   --  1.8   Liver Function Tests: Recent Labs  Lab 07/21/17 0757 07/23/17 0614  AST 20 14*  ALT 24 16  ALKPHOS 94 83  BILITOT 2.1* 1.2  PROT 7.6 6.6  ALBUMIN 3.3* 2.6*   Recent Labs  Lab 07/21/17 0757 07/23/17 0614  LIPASE 31 19   No results for input(s): AMMONIA in the last 168 hours. Coagulation Profile: No results for input(s): INR, PROTIME in the last 168 hours. CBC: Recent Labs  Lab 07/21/17 0757 07/22/17 0751 07/23/17 0614 07/24/17 0610 07/25/17 0451  WBC 26.7* 24.8* 21.2* 19.2* 15.1*  NEUTROABS 22.8*  --   --   --   --   HGB 14.3 13.5 12.3 12.6 12.5  HCT 44.2 41.0 37.9 38.8 38.7  MCV 97.4 97.9 97.2 97.2 97.2  PLT 222 239 273 300 304   Cardiac Enzymes: No results for input(s): CKTOTAL, CKMB, CKMBINDEX, TROPONINI in the last 168 hours. BNP: Invalid input(s): POCBNP CBG: Recent Labs  Lab 07/24/17 1102 07/24/17 1628 07/24/17 2112 07/25/17 0752 07/25/17 1144  GLUCAP 188* 163* 190* 131* 163*   HbA1C: No results for input(s): HGBA1C in the last 72 hours. Urine analysis:    Component Value Date/Time   COLORURINE AMBER (A) 07/21/2017 0756   APPEARANCEUR HAZY (A) 07/21/2017 0756   LABSPEC 1.024 07/21/2017 0756   PHURINE 5.0 07/21/2017 0756   GLUCOSEU NEGATIVE 07/21/2017 0756   HGBUR NEGATIVE 07/21/2017 0756   BILIRUBINUR NEGATIVE 07/21/2017 0756   KETONESUR 5 (A) 07/21/2017 0756   PROTEINUR  30 (A) 07/21/2017 0756   UROBILINOGEN 1 10/13/2014 1548   NITRITE NEGATIVE 07/21/2017 0756   LEUKOCYTESUR NEGATIVE 07/21/2017 0756   Sepsis Labs: @LABRCNTIP (procalcitonin:4,lacticidven:4) ) Recent Results (from the past 240 hour(s))  Urine Culture     Status: Abnormal   Collection Time: 07/21/17  7:56 AM  Result Value Ref Range Status   Specimen Description URINE, CATHETERIZED  Final   Special Requests NONE  Final   Culture 50,000 COLONIES/mL KLEBSIELLA PNEUMONIAE (A)  Final   Report Status 07/23/2017 FINAL  Final   Organism ID, Bacteria KLEBSIELLA PNEUMONIAE (A)  Final      Susceptibility   Klebsiella pneumoniae - MIC*    AMPICILLIN >=32 RESISTANT Resistant     CEFAZOLIN <=4 SENSITIVE Sensitive     CEFTRIAXONE <=1 SENSITIVE Sensitive     CIPROFLOXACIN 0.5 SENSITIVE Sensitive     GENTAMICIN <=1 SENSITIVE Sensitive     IMIPENEM <=0.25 SENSITIVE Sensitive     NITROFURANTOIN 64 INTERMEDIATE Intermediate     TRIMETH/SULFA <=20 SENSITIVE Sensitive     AMPICILLIN/SULBACTAM 4 SENSITIVE Sensitive     PIP/TAZO 8 SENSITIVE Sensitive     Extended ESBL NEGATIVE Sensitive     * 50,000 COLONIES/mL KLEBSIELLA PNEUMONIAE  Wet prep, genital  Status: Abnormal   Collection Time: 07/21/17 10:08 AM  Result Value Ref Range Status   Yeast Wet Prep HPF POC NONE SEEN NONE SEEN Final   Trich, Wet Prep NONE SEEN NONE SEEN Final   Clue Cells Wet Prep HPF POC PRESENT (A) NONE SEEN Final   WBC, Wet Prep HPF POC MANY (A) NONE SEEN Final   Sperm NONE SEEN  Final  Culture, blood (Routine X 2) w Reflex to ID Panel     Status: None (Preliminary result)   Collection Time: 07/21/17  3:06 PM  Result Value Ref Range Status   Specimen Description LEFT ANTECUBITAL  Final   Special Requests   Final    BOTTLES DRAWN AEROBIC AND ANAEROBIC Blood Culture results may not be optimal due to an inadequate volume of blood received in culture bottles   Culture NO GROWTH 4 DAYS  Final   Report Status PENDING  Incomplete   Culture, blood (Routine X 2) w Reflex to ID Panel     Status: None (Preliminary result)   Collection Time: 07/21/17  3:06 PM  Result Value Ref Range Status   Specimen Description LEFT ANTECUBITAL  Final   Special Requests   Final    BOTTLES DRAWN AEROBIC AND ANAEROBIC Blood Culture results may not be optimal due to an inadequate volume of blood received in culture bottles   Culture NO GROWTH 4 DAYS  Final   Report Status PENDING  Incomplete     Scheduled Meds: . aspirin EC  81 mg Oral QHS  . enoxaparin (LOVENOX) injection  70 mg Subcutaneous Q24H  . insulin aspart  0-5 Units Subcutaneous QHS  . insulin aspart  0-9 Units Subcutaneous TID WC  . loratadine  10 mg Oral Daily  . metroNIDAZOLE  500 mg Oral Q8H  . multivitamin with minerals  1 tablet Oral q morning - 10a  . polyethylene glycol  17 g Oral Daily  . senna  2 tablet Oral Daily   Continuous Infusions: . piperacillin-tazobactam (ZOSYN)  IV 3.375 g (07/25/17 1325)    Procedures/Studies: US Pelvis Transvanginal Non-ob (tv Only)  Result Date: 07/21/2017 CLINICAL DATA:  Right-sided pelvic pain. Evaluate for possible torsion. Negative urine pregnancy test. EXAM: TRANSABDOMINAL AND TRANSVAGINAL ULTRASOUND OF PELVIS DOPPLER ULTRASOUND OF OVARIES TECHNIQUE: Both transabdominal and transvaginal ultrasound examinations of the pelvis were performed. Transabdominal technique was performed for global imaging of the pelvis including uterus, ovaries, adnexal regions, and pelvic cul-de-sac. It was necessary to proceed with endovaginal exam following the transabdominal exam to visualize the ovaries. Color and duplex Doppler ultrasound was utilized to evaluate blood flow to the ovaries. COMPARISON:  CT of the abdomen and pelvis on 07/21/2017 FINDINGS: Uterus Measurements: At least 11.7 x 7.4 x 7.1 cm. A large central heterogeneous uterine mass is 6.8 x 7.6 x 6.3 cm, compatible with fibroid. This mass displaces the endometrial canal to the right.  Endometrium Thickness: 4.9 mm, displaced as described. No focal abnormality visualized. Right ovary Measurements: 4.8 x 4.6 x 4.0 cm. On transabdominal images only, there is suggestion of a tubular structure within the right adnexa, discrete from the right ovary. This structure is not confirmed on the endovaginal portion of the exam follow-up would correlate well with the CT abnormality. Left ovary Measurements: Surgically absent. Pulsed Doppler evaluation of both ovaries demonstrates normal low-resistance arterial and venous waveforms in the right ovary. Other findings Trace free pelvic fluid. Study quality is degraded by patient body habitus. IMPRESSION: 1. Large central uterine fibroid  measuring 7.6 cm, displacing the endometrium to the right. 2. Left oophorectomy. 3. Incompletely evaluated right adnexal region. There is suggestion of a tubular structure only on the transabdominal images, suggesting hydrosalpinx, pyosalpinx or tubo-ovarian abscess. 4. No evidence for torsion at this time. 5. Follow-up imaging is indicated. I would consider repeat pelvic imaging in 8-12 weeks. If the right adnexal region is incompletely evaluated at that time, MRI may be necessary. Electronically Signed   By: Nolon Nations M.D.   On: 07/21/2017 11:37   US Pelvis (transabdominal Only)  Result Date: 07/21/2017 CLINICAL DATA:  Right-sided pelvic pain. Evaluate for possible torsion. Negative urine pregnancy test. EXAM: TRANSABDOMINAL AND TRANSVAGINAL ULTRASOUND OF PELVIS DOPPLER ULTRASOUND OF OVARIES TECHNIQUE: Both transabdominal and transvaginal ultrasound examinations of the pelvis were performed. Transabdominal technique was performed for global imaging of the pelvis including uterus, ovaries, adnexal regions, and pelvic cul-de-sac. It was necessary to proceed with endovaginal exam following the transabdominal exam to visualize the ovaries. Color and duplex Doppler ultrasound was utilized to evaluate blood flow to the  ovaries. COMPARISON:  CT of the abdomen and pelvis on 07/21/2017 FINDINGS: Uterus Measurements: At least 11.7 x 7.4 x 7.1 cm. A large central heterogeneous uterine mass is 6.8 x 7.6 x 6.3 cm, compatible with fibroid. This mass displaces the endometrial canal to the right. Endometrium Thickness: 4.9 mm, displaced as described. No focal abnormality visualized. Right ovary Measurements: 4.8 x 4.6 x 4.0 cm. On transabdominal images only, there is suggestion of a tubular structure within the right adnexa, discrete from the right ovary. This structure is not confirmed on the endovaginal portion of the exam follow-up would correlate well with the CT abnormality. Left ovary Measurements: Surgically absent. Pulsed Doppler evaluation of both ovaries demonstrates normal low-resistance arterial and venous waveforms in the right ovary. Other findings Trace free pelvic fluid. Study quality is degraded by patient body habitus. IMPRESSION: 1. Large central uterine fibroid measuring 7.6 cm, displacing the endometrium to the right. 2. Left oophorectomy. 3. Incompletely evaluated right adnexal region. There is suggestion of a tubular structure only on the transabdominal images, suggesting hydrosalpinx, pyosalpinx or tubo-ovarian abscess. 4. No evidence for torsion at this time. 5. Follow-up imaging is indicated. I would consider repeat pelvic imaging in 8-12 weeks. If the right adnexal region is incompletely evaluated at that time, MRI may be necessary. Electronically Signed   By: Nolon Nations M.D.   On: 07/21/2017 11:37   Ct Abdomen Pelvis W Contrast  Result Date: 07/21/2017 CLINICAL DATA:  Lower abdominal pain with nausea and diarrhea. Fever. EXAM: CT ABDOMEN AND PELVIS WITH CONTRAST TECHNIQUE: Multidetector CT imaging of the abdomen and pelvis was performed using the standard protocol following bolus administration of intravenous contrast. CONTRAST:  179mL ISOVUE-300 IOPAMIDOL (ISOVUE-300) INJECTION 61% COMPARISON:  None.  FINDINGS: Lower chest: There is bibasilar atelectasis. Hepatobiliary: Liver measures 20.2 cm in length. No focal liver lesions are evident. Gallbladder wall is not appreciably thickened. There is no biliary duct dilatation. Pancreas: There is no pancreatic mass or inflammatory focus. Spleen: No splenic lesions are evident. There is a small splenule anterior and inferior to the spleen. Adrenals/Urinary Tract: Adrenals appear normal bilaterally. Kidneys bilaterally show no evident mass or hydronephrosis on either side. There is no renal or ureteral calculus. Urinary bladder is midline with wall thickness within normal limits. Stomach/Bowel: There is no appreciable bowel wall thickening. There is inflammatory change in the right lower quadrant which appears to arise from the right ovary region which extends  to the level of the proximal at ascending colon is felt to be separate from the ascending colon. There is no evident bowel obstruction. No free air or portal venous air is evident. Vascular/Lymphatic: There is no abdominal aortic aneurysm. There is mild calcification in the common iliac arteries. Major mesenteric arteries appear normal. There is no evident adenopathy in the abdomen or pelvis. Reproductive: Uterus is anteverted. The uterus appears prominent, likely with leiomyomatous change. A well-defined leiomyomas not seen. The left ovary has been removed. The right ovary appears enlarged and somewhat complex in appearance measuring 6.6 x 5.8 x 5.0 cm in size. There is mesenteric inflammation throughout this area extending to the level of the ascending colon on the right as well as extending across the midline anterior slightly superior to the uterus. There is loculated fluid surrounding the upper aspect of the uterus, apparently tracking from the right adnexal region. Other: The appendix appears normal, although the appendix is near the inflammation which appears to arise from the right ovary. AP well-defined  abscess containing areas not seen on this study. There is a ventral hernia containing fat but no bowel. The neck of this hernia measures 2.6 cm from right to left dimension and 2.4 cm from superior to inferior dimension. There is a nearby area of fat along the anterior abdominal wall just anterior to the rectus muscle and just superior to the ventral hernia. This presumed abdominal wall lipoma measures 3.3 x 2.3 cm. No ascites is evident in the abdomen or pelvis. Loculated fluid is noted along the anterior uterus as described above. Musculoskeletal: There is degenerative change in the lower thoracic and lumbar spine regions. There is moderately severe spinal stenosis at L4-5 due to diffuse bony hypertrophy and disc protrusion. There is moderate calcified central disc protrusion at L2-3. No blastic or lytic bone lesions are evident. There is no intramuscular or abdominal wall lesion. IMPRESSION: 1. The right ovary is enlarged with surrounding mesenteric thickening. Inflammation extends to the right, extending to the level of the proximal at ascending colon. Fluid and inflammation extends to the left along the superior aspect of the uterus, crossing the midline to the left of the uterus. This appearance raises concern for potential tubo-ovarian abscess. Right ovarian torsion is a differential consideration; correlation with pelvic ultrasound including Doppler evaluation is felt to be advisable in this circumstance. 2. Appendix is felt to be within normal limits. There is inflammation in the periappendiceal region, felt to arise from the right ovary. 3. Prominent uterus with suspected leiomyomatous change, although a well-defined leiomyoma is not evident. 4.  Left ovary surgically absent. 5. No bowel obstruction. Mild inflammation along the wall of the ascending colon is felt to be due to inflammation arising from the right ovary. 6. Midline ventral hernia containing only fat. Just superior to this hernia, there is a  benign lipoma in the anterior abdominal wall just superior to the hernia. 7.  Prominent liver without focal liver lesion. 8. No air-containing abscess seen in the abdomen or pelvis. No bowel obstruction. 9. Severe spinal stenosis at L4-5, multifactorial. Central calcified disc protrusion at L2-3 causing borderline narrowing of the thecal sac. 10.  No renal or ureteral calculus.  No hydronephrosis. Electronically Signed   By: Lowella Grip III M.D.   On: 07/21/2017 09:51   US Pelvic Doppler (torsion R/o Or Mass Arterial Flow)  Result Date: 07/21/2017 CLINICAL DATA:  Right-sided pelvic pain. Evaluate for possible torsion. Negative urine pregnancy test. EXAM: TRANSABDOMINAL AND TRANSVAGINAL  ULTRASOUND OF PELVIS DOPPLER ULTRASOUND OF OVARIES TECHNIQUE: Both transabdominal and transvaginal ultrasound examinations of the pelvis were performed. Transabdominal technique was performed for global imaging of the pelvis including uterus, ovaries, adnexal regions, and pelvic cul-de-sac. It was necessary to proceed with endovaginal exam following the transabdominal exam to visualize the ovaries. Color and duplex Doppler ultrasound was utilized to evaluate blood flow to the ovaries. COMPARISON:  CT of the abdomen and pelvis on 07/21/2017 FINDINGS: Uterus Measurements: At least 11.7 x 7.4 x 7.1 cm. A large central heterogeneous uterine mass is 6.8 x 7.6 x 6.3 cm, compatible with fibroid. This mass displaces the endometrial canal to the right. Endometrium Thickness: 4.9 mm, displaced as described. No focal abnormality visualized. Right ovary Measurements: 4.8 x 4.6 x 4.0 cm. On transabdominal images only, there is suggestion of a tubular structure within the right adnexa, discrete from the right ovary. This structure is not confirmed on the endovaginal portion of the exam follow-up would correlate well with the CT abnormality. Left ovary Measurements: Surgically absent. Pulsed Doppler evaluation of both ovaries demonstrates  normal low-resistance arterial and venous waveforms in the right ovary. Other findings Trace free pelvic fluid. Study quality is degraded by patient body habitus. IMPRESSION: 1. Large central uterine fibroid measuring 7.6 cm, displacing the endometrium to the right. 2. Left oophorectomy. 3. Incompletely evaluated right adnexal region. There is suggestion of a tubular structure only on the transabdominal images, suggesting hydrosalpinx, pyosalpinx or tubo-ovarian abscess. 4. No evidence for torsion at this time. 5. Follow-up imaging is indicated. I would consider repeat pelvic imaging in 8-12 weeks. If the right adnexal region is incompletely evaluated at that time, MRI may be necessary. Electronically Signed   By: Nolon Nations M.D.   On: 07/21/2017 11:37   Dg Chest Port 1 View  Result Date: 07/21/2017 CLINICAL DATA:  Lower abd pain with diarrhea and nausea since Wed. Diffuse abd pain with peritonitis -r/o free air. EXAM: PORTABLE CHEST 1 VIEW COMPARISON:  11/26/2013 FINDINGS: Heart size is accentuated by the portable technique. Shallow lung inflation accentuate bronchovascular markings. There are no focal consolidations or pleural effusions. No pulmonary edema. No evidence for free intraperitoneal air on this erect view of the chest. IMPRESSION: No evidence for acute cardiopulmonary abnormality. Electronically Signed   By: Nolon Nations M.D.   On: 07/21/2017 08:24   Dg Abd Acute W/chest  Result Date: 07/22/2017 CLINICAL DATA:  Lower abdominal pain and nausea.  History of asthma. EXAM: DG ABDOMEN ACUTE W/ 1V CHEST COMPARISON:  None. FINDINGS: There is no evidence of dilated bowel loops or free intraperitoneal air. No radiopaque calculi or other significant radiographic abnormality is seen. Heart size and mediastinal contours are within normal limits. Both lungs are clear. IMPRESSION: Negative abdominal radiographs.  No acute cardiopulmonary disease. Electronically Signed   By: Fidela Salisbury M.D.    On: 07/22/2017 14:20    Shaft Corigliano, DO  [facility] Pager [phone]  If 7PM-7AM, please contact night-coverage [email or web address] Password [email or web address] 07/25/2017, 3:28 PM   LOS: 4 days

## 2017-07-26 LAB — CBC WITH DIFFERENTIAL/PLATELET
BASOS PCT: 0 %
Basophils Absolute: 0 10*3/uL (ref 0.0–0.1)
EOS PCT: 5 %
Eosinophils Absolute: 0.8 10*3/uL — ABNORMAL HIGH (ref 0.0–0.7)
HCT: 39.1 % (ref 36.0–46.0)
HEMOGLOBIN: 12.4 g/dL (ref 12.0–15.0)
LYMPHS PCT: 23 %
Lymphs Abs: 3.6 10*3/uL (ref 0.7–4.0)
MCH: 30.5 pg (ref 26.0–34.0)
MCHC: 31.7 g/dL (ref 30.0–36.0)
MCV: 96.1 fL (ref 78.0–100.0)
Monocytes Absolute: 1.7 10*3/uL — ABNORMAL HIGH (ref 0.1–1.0)
Monocytes Relative: 11 %
NEUTROS ABS: 9.5 10*3/uL — AB (ref 1.7–7.7)
NEUTROS PCT: 61 %
Platelets: 350 10*3/uL (ref 150–400)
RBC: 4.07 MIL/uL (ref 3.87–5.11)
RDW: 13 % (ref 11.5–15.5)
WBC: 15.6 10*3/uL — ABNORMAL HIGH (ref 4.0–10.5)

## 2017-07-26 LAB — CULTURE, BLOOD (ROUTINE X 2)
Culture: NO GROWTH
Culture: NO GROWTH

## 2017-07-26 LAB — GLUCOSE, CAPILLARY
GLUCOSE-CAPILLARY: 96 mg/dL (ref 65–99)
Glucose-Capillary: 179 mg/dL — ABNORMAL HIGH (ref 65–99)

## 2017-07-26 MED ORDER — OXYCODONE HCL 5 MG PO TABS: 5.0000 mg | ORAL_TABLET | ORAL | 0 refills | Status: DC | PRN

## 2017-07-26 MED ORDER — DOXYCYCLINE HYCLATE 100 MG PO TABS: 100.0000 mg | ORAL_TABLET | Freq: Two times a day (BID) | ORAL | 0 refills | Status: DC

## 2017-07-26 MED ORDER — MOMETASONE FURO-FORMOTEROL FUM 200-5 MCG/ACT IN AERO: 2.0000 | INHALATION_SPRAY | Freq: Two times a day (BID) | RESPIRATORY_TRACT | 1 refills | Status: DC

## 2017-07-26 MED ORDER — ONDANSETRON 8 MG PO TBDP: 8.0000 mg | ORAL_TABLET | Freq: Three times a day (TID) | ORAL | 0 refills | Status: DC | PRN

## 2017-07-26 MED ORDER — METRONIDAZOLE 500 MG PO TABS: 500.0000 mg | ORAL_TABLET | Freq: Three times a day (TID) | ORAL | 0 refills | Status: DC

## 2017-07-26 NOTE — Discharge Summary (Signed)
Physician Discharge Summary  Patient ID: Amy Molina MRN: [id number] DOB/AGE: 12/30/1960 56 y.o.  Admit date: 07/21/2017 Discharge date: 07/26/2017  Admission Diagnoses: Right tubo ovarian abscess  Discharge Diagnoses:  Active Problems:   Hypertension   Obesity, morbid ([place])   TOA (tubo-ovarian abscess)   Uncontrolled type 2 diabetes mellitus with hyperglycemia ([place])   Abdominal pain   [name]   Discharged Condition: good  Hospital Course: pt admitted for a right tubo ovarian abscess with significant WBC Was placed on imipenem and doxycycline and changed after 3 days to zosyn/flagyl with improved response Had 5+ days of Iv antibiotics with significant drop in WBC and normalization of fever curve  Discharged on doxycycline and flagyl, choices complicated by her PCN and orfloxacin allegies  Consults: hospitalist sservice  Significant Diagnostic Studies: radiology: CT scan: rigth TOA, not felt to be drainable by IR  Treatments: antibiotics: vancomycin  Discharge Exam: Blood pressure 123/69, pulse 82, temperature 98 F (36.7 C), temperature source Oral, resp. rate 16, height _0  (1.651 m), weight (!) 311 lb 4.8 oz (141.2 kg), last menstrual period 08/09/2016, SpO2 90 %. General appearance: alert, cooperative and no distress GI: soft, non-tender; bowel sounds normal; no masses,  no organomegaly Some tenderness does persist but no bebound or guarding Disposition: 01-Home or Self Care  Discharge Instructions    Call MD for:  persistant nausea and vomiting   Complete by:  As directed    Call MD for:  severe uncontrolled pain   Complete by:  As directed    Call MD for:  temperature >100.4   Complete by:  As directed    Diet - low sodium heart healthy   Complete by:  As directed    Driving Restrictions   Complete by:  As directed    No restrictions   Increase activity slowly   Complete by:  As directed    Lifting restrictions   Complete by:  As  directed    Do not lift more than 20 pounds   Sexual Activity Restrictions   Complete by:  As directed    No sex for 2 weeks     Allergies as of 07/26/2017      Reactions   Penicillins Swelling   Has patient had a PCN reaction causing immediate rash, facial/tongue/throat swelling, SOB or lightheadedness with hypotension: Yes Has patient had a PCN reaction causing severe rash involving mucus membranes or skin necrosis: No Has patient had a PCN reaction that required hospitalization Yes Has patient had a PCN reaction occurring within the last 10 years: Yes If all of the above answers are "NO", then may proceed with Cephalosporin use. PATIENT TOLERATED ZOSYN AND MERREM ON 07/21/17 ADMISSION   Floxin [ofloxacin]    MIGRAINES, INSOMNIA, PARANOIND, RINGING IN EARS   Lipitor [atorvastatin]    Severe myalgias      Medication List    STOP taking these medications   nitrofurantoin (macrocrystal-monohydrate) 100 MG capsule Commonly known as:  MACROBID   SUPREP BOWEL PREP KIT 17.5-3.13-1.6 GM/177ML Soln Generic drug:  Na Sulfate-K Sulfate-Mg Sulf     TAKE these medications   ALPRAZolam 0.5 MG tablet Commonly known as:  XANAX Take 1 tablet (0.5 mg total) by mouth 3 (three) times daily as needed for sleep or anxiety.   aspirin 81 MG tablet Take 81 mg by mouth at bedtime.   doxycycline 100 MG tablet Commonly known as:  VIBRA-TABS Take 1 tablet (100 mg total) 2 (  two) times daily by mouth.   Fluticasone-Salmeterol 100-50 MCG/DOSE Aepb Commonly known as:  ADVAIR Inhale 1 puff into the lungs 2 (two) times daily as needed (wheezing).   loratadine 10 MG tablet Commonly known as:  CLARITIN Take 10 mg by mouth daily.   losartan 50 MG tablet Commonly known as:  COZAAR Take 1 tablet (50 mg total) by mouth daily. What changed:  when to take this   metFORMIN 1000 MG tablet Commonly known as:  GLUCOPHAGE Take 1 tablet (1,000 mg total) by mouth daily with breakfast.   metroNIDAZOLE  500 MG tablet Commonly known as:  FLAGYL Take 1 tablet (500 mg total) every 8 (eight) hours by mouth.   mometasone-formoterol 200-5 MCG/ACT Aero Commonly known as:  DULERA Inhale 2 puffs 2 (two) times daily into the lungs.   multivitamin with minerals Tabs tablet Take 1 tablet by mouth every morning.   naproxen sodium 220 MG tablet Commonly known as:  ALEVE Take 440-660 mg by mouth 2 (two) times daily as needed (pain.).   ondansetron 8 MG disintegrating tablet Commonly known as:  ZOFRAN-ODT Take 1 tablet every 8 (eight) hours by mouth. What changed:  Another medication with the same name was added. Make sure you understand how and when to take each.   ondansetron 8 MG disintegrating tablet Commonly known as:  ZOFRAN ODT Take 1 tablet (8 mg total) every 8 (eight) hours as needed by mouth for nausea or vomiting. What changed:  You were already taking a medication with the same name, and this prescription was added. Make sure you understand how and when to take each.   oxyCODONE 5 MG immediate release tablet Commonly known as:  Oxy IR/ROXICODONE Take 1 tablet (5 mg total) every 4 (four) hours as needed by mouth for moderate pain.   ranitidine 150 MG tablet Commonly known as:  ZANTAC Take 150 mg by mouth at bedtime.   VITAMIN B-12 CR PO Take 1 tablet by mouth daily.      Follow-up Information    Florian Buff, MD Follow up in 2 week(s).   Specialties:  Obstetrics and Gynecology, Radiology Contact information: [place] [id number] [phone]           Signed: Florian Buff 07/26/2017, 1:52 PM

## 2017-07-26 NOTE — Progress Notes (Signed)
Discharge instructions read to patient and family.  Both verbalized understanding of instructions. Discharged to home with family

## 2017-07-26 NOTE — Discharge Instructions (Signed)
Pelvic Inflammatory Disease Pelvic inflammatory disease (PID) is an infection in some or all of the female organs. PID can be in the uterus, ovaries, fallopian tubes, or the surrounding tissues that are inside the lower belly area (pelvis). PID can lead to lasting problems if it is not treated. To check for this disease, your doctor may:  Do a physical exam.  Do blood tests, urine tests, or a pregnancy test.  Look at your vaginal discharge.  Do tests to look inside the pelvis.  Test you for other infections.  Follow these instructions at home:  Take over-the-counter and prescription medicines only as told by your doctor.  If you were prescribed an antibiotic medicine, take it as told by your doctor. Do not stop taking it even if you start to feel better.  Do not have sex until treatment is done or as told by your doctor.  Tell your sex partner if you have PID. Your partner may need to be treated.  Keep all follow-up visits as told by your doctor. This is important.  Your doctor may test you for infection again 3 months after you are treated. Contact a doctor if:  You have more fluid (discharge) coming from your vagina or fluid that is not normal.  Your pain does not improve.  You throw up (vomit).  You have a fever.  You cannot take your medicines.  Your partner has a sexually transmitted disease (STD).  You have pain when you pee (urinate). Get help right away if:  You have more belly (abdominal) or lower belly pain.  You have chills.  You are not better after 72 hours. This information is not intended to replace advice given to you by your health care provider. Make sure you discuss any questions you have with your health care provider. Document Released: 11/25/2008 Document Revised: 02/04/2016 Document Reviewed: 10/06/2014 Elsevier Interactive Patient Education  [employer].

## 2017-08-01 DIAGNOSIS — Z029 Encounter for administrative examinations, unspecified: Secondary | ICD-10-CM

## 2017-08-10 ENCOUNTER — Ambulatory Visit: Payer: 59 | Admitting: Obstetrics & Gynecology

## 2017-08-10 ENCOUNTER — Encounter: Payer: Self-pay | Admitting: Obstetrics & Gynecology

## 2017-08-10 VITALS — BP 162/98 | HR 94 | Wt 311.0 lb

## 2017-08-10 DIAGNOSIS — N7093 Salpingitis and oophoritis, unspecified: Secondary | ICD-10-CM | POA: Diagnosis not present

## 2017-08-10 NOTE — Progress Notes (Signed)
Chief Complaint  Patient presents with  . Follow-up    ER f/u tubo-ovarian abscess      56 y.o. G1P0010 Patient's last menstrual period was 08/09/2016. The current method of family planning is menopause.  Outpatient Encounter Medications as of 08/10/2017  Medication Sig  . aspirin 81 MG tablet Take 81 mg by mouth at bedtime.   . Cyanocobalamin (VITAMIN B-12 CR PO) Take 1 tablet by mouth daily.  Marland Kitchen loratadine (CLARITIN) 10 MG tablet Take 10 mg by mouth daily.  Marland Kitchen losartan (COZAAR) 50 MG tablet Take 1 tablet (50 mg total) by mouth daily. (Patient taking differently: Take 50 mg by mouth at bedtime. )  . metFORMIN (GLUCOPHAGE) 1000 MG tablet Take 1 tablet (1,000 mg total) by mouth daily with breakfast.  . Multiple Vitamin (MULTIVITAMIN WITH MINERALS) TABS tablet Take 1 tablet by mouth every morning.  . naproxen sodium (ANAPROX) 220 MG tablet Take 440-660 mg by mouth 2 (two) times daily as needed (pain.).   Marland Kitchen ondansetron (ZOFRAN ODT) 8 MG disintegrating tablet Take 1 tablet (8 mg total) every 8 (eight) hours as needed by mouth for nausea or vomiting.  Marland Kitchen oxyCODONE (OXY IR/ROXICODONE) 5 MG immediate release tablet Take 1 tablet (5 mg total) every 4 (four) hours as needed by mouth for moderate pain.  . ranitidine (ZANTAC) 150 MG tablet Take 150 mg by mouth at bedtime.   . ALPRAZolam (XANAX) 0.5 MG tablet Take 1 tablet (0.5 mg total) by mouth 3 (three) times daily as needed for sleep or anxiety. (Patient not taking: Reported on 08/10/2017)  . Fluticasone-Salmeterol (ADVAIR) 100-50 MCG/DOSE AEPB Inhale 1 puff into the lungs 2 (two) times daily as needed (wheezing).  . mometasone-formoterol (DULERA) 200-5 MCG/ACT AERO Inhale 2 puffs 2 (two) times daily into the lungs. (Patient not taking: Reported on 08/10/2017)  . [DISCONTINUED] doxycycline (VIBRA-TABS) 100 MG tablet Take 1 tablet (100 mg total) 2 (two) times daily by mouth. (Patient not taking: Reported on 08/10/2017)  . [DISCONTINUED]  metroNIDAZOLE (FLAGYL) 500 MG tablet Take 1 tablet (500 mg total) every 8 (eight) hours by mouth. (Patient not taking: Reported on 08/10/2017)  . [DISCONTINUED] ondansetron (ZOFRAN-ODT) 8 MG disintegrating tablet Take 1 tablet every 8 (eight) hours by mouth.   No facility-administered encounter medications on file as of 08/10/2017.     Subjective Amy Molina is seen today as a follow-up from her hospital admission She was admitted to the hospital from 07/21/2017 to 07/26/2017 for a right tubo-ovarian abscess.  She was slow to respond but then switch her to Zosyn and she did much better with resolution of her pain and normalization of her white count  Since admission she states her pain has almost completely resolved, she denies any fever and is really overall been feeling fine  She has finished up her oral course of antibiotics of doxycycline and Flagyl due to her allergy to quinolones  Past Medical History:  Diagnosis Date  . Anxiety   . Arthritis    general stiffness of joints  . Asthma   . Diabetes mellitus ([place])   . Heart murmur    childhood years- no mention in adult yrs  . Hypertension   . Morbid obesity ([place])   . Seasonal allergies     Past Surgical History:  Procedure Laterality Date  . COLONOSCOPY N/A 08/04/2015   Procedure: COLONOSCOPY;  Surgeon: Jerene Bears, MD;  Location: [facility];  Service: Gastroenterology;  Laterality: N/A;  . OOPHORECTOMY  05-2000   REMOVED LEFT TUBE AND OVARY  . WISDOM TOOTH EXTRACTION      OB History    Gravida Para Term Preterm AB Living   1       1 0   SAB TAB Ectopic Multiple Live Births   1              Allergies  Allergen Reactions  . Penicillins Swelling    Has patient had a PCN reaction causing immediate rash, facial/tongue/throat swelling, SOB or lightheadedness with hypotension: Yes Has patient had a PCN reaction causing severe rash involving mucus membranes or skin necrosis: No Has patient had a PCN reaction that  required hospitalization Yes Has patient had a PCN reaction occurring within the last 10 years: Yes If all of the above answers are "NO", then may proceed with Cephalosporin use.  PATIENT TOLERATED ZOSYN AND MERREM ON 07/21/17 ADMISSION   . Floxin [Ofloxacin]     MIGRAINES, INSOMNIA, PARANOIND, RINGING IN EARS  . Lipitor [Atorvastatin]     Severe myalgias    Social History   Socioeconomic History  . Marital status: Single    Spouse name: None  . Number of children: None  . Years of education: None  . Highest education level: None  Social Needs  . Financial resource strain: None  . Food insecurity - worry: None  . Food insecurity - inability: None  . Transportation needs - medical: None  . Transportation needs - non-medical: None  Occupational History  . None  Tobacco Use  . Smoking status: Never Smoker  . Smokeless tobacco: Never Used  Substance and Sexual Activity  . Alcohol use: Yes    Alcohol/week: 0.0 oz    Comment: RARELY  . Drug use: No  . Sexual activity: Not Currently    Birth control/protection: None    Comment: 1st intercourse 56 yo-More than 5 partners  Other Topics Concern  . None  Social History Narrative  . None    Family History  Problem Relation Age of Onset  . Heart disease Mother   . Hypertension Father   . Heart disease Father   . Colon polyps Father   . Diabetes Sister   . Diabetes Maternal Aunt        7 mat aunts diabetes  . Hypertension Maternal Aunt   . Lung disease Maternal Aunt   . Hypertension Paternal Aunt   . Colon cancer Neg Hx     Medications:       Current Outpatient Medications:  .  aspirin 81 MG tablet, Take 81 mg by mouth at bedtime. , Disp: , Rfl:  .  Cyanocobalamin (VITAMIN B-12 CR PO), Take 1 tablet by mouth daily., Disp: , Rfl:  .  loratadine (CLARITIN) 10 MG tablet, Take 10 mg by mouth daily., Disp: , Rfl:  .  losartan (COZAAR) 50 MG tablet, Take 1 tablet (50 mg total) by mouth daily. (Patient taking differently:  Take 50 mg by mouth at bedtime. ), Disp: 90 tablet, Rfl: 3 .  metFORMIN (GLUCOPHAGE) 1000 MG tablet, Take 1 tablet (1,000 mg total) by mouth daily with breakfast., Disp: 90 tablet, Rfl: 3 .  Multiple Vitamin (MULTIVITAMIN WITH MINERALS) TABS tablet, Take 1 tablet by mouth every morning., Disp: , Rfl:  .  naproxen sodium (ANAPROX) 220 MG tablet, Take 440-660 mg by mouth 2 (two) times daily as needed (pain.). , Disp: , Rfl:  .  ondansetron (ZOFRAN ODT) 8 MG disintegrating tablet, Take 1 tablet (8  mg total) every 8 (eight) hours as needed by mouth for nausea or vomiting., Disp: 20 tablet, Rfl: 0 .  oxyCODONE (OXY IR/ROXICODONE) 5 MG immediate release tablet, Take 1 tablet (5 mg total) every 4 (four) hours as needed by mouth for moderate pain., Disp: 30 tablet, Rfl: 0 .  ranitidine (ZANTAC) 150 MG tablet, Take 150 mg by mouth at bedtime. , Disp: , Rfl:  .  ALPRAZolam (XANAX) 0.5 MG tablet, Take 1 tablet (0.5 mg total) by mouth 3 (three) times daily as needed for sleep or anxiety. (Patient not taking: Reported on 08/10/2017), Disp: 30 tablet, Rfl: 1 .  Fluticasone-Salmeterol (ADVAIR) 100-50 MCG/DOSE AEPB, Inhale 1 puff into the lungs 2 (two) times daily as needed (wheezing)., Disp: , Rfl:  .  mometasone-formoterol (DULERA) 200-5 MCG/ACT AERO, Inhale 2 puffs 2 (two) times daily into the lungs. (Patient not taking: Reported on 08/10/2017), Disp: 1 Inhaler, Rfl: 1  Objective Blood pressure (!) 162/98, pulse 94, weight (!) 311 lb (141.1 kg), last menstrual period 08/09/2016.  General WDWN female NAD Abdomen  soft benign nontender no guarding Vulva:  normal appearing vulva with no masses, tenderness or lesions Vagina:  normal mucosa, no discharge Cervix:  no cervical motion tenderness and no lesions Uterus:  normal size, contour, position, consistency, mobility, non-tender Adnexa: ovaries:right adnexa non tender,     Pertinent ROS No burning with urination, frequency or urgency No nausea, vomiting or  diarrhea Nor fever chills or other constitutional symptoms   Labs or studies No new labs or studies all of the ones from the hospital are reviewed    Impression Diagnoses this Encounter::   ICD-10-CM   1. Tubo-ovarian abscess N70.93     Established relevant diagnosis(es): History of left tubo-ovarian abscess in the past  Plan/Recommendations: No orders of the defined types were placed in this encounter.   Labs or Scans Ordered: No orders of the defined types were placed in this encounter.   Management:: Patient has completed her oral antibiotics she will be seen back in the future if needed if her pain and other symptoms return Otherwise we will see her back in about 6 weeks for repeat GYN ultrasound to evaluate the status of the right tubo-ovarian complex  Follow up Return in about 6 weeks (around 09/21/2017) for GYN sono, Follow up, with Dr Elonda Husky.       All questions were answered.

## 2017-09-25 ENCOUNTER — Ambulatory Visit: Payer: 59 | Admitting: Obstetrics & Gynecology

## 2017-09-25 ENCOUNTER — Other Ambulatory Visit: Payer: 59

## 2017-09-27 ENCOUNTER — Other Ambulatory Visit: Payer: Self-pay | Admitting: Obstetrics & Gynecology

## 2017-09-27 DIAGNOSIS — N838 Other noninflammatory disorders of ovary, fallopian tube and broad ligament: Secondary | ICD-10-CM

## 2017-09-27 DIAGNOSIS — D219 Benign neoplasm of connective and other soft tissue, unspecified: Secondary | ICD-10-CM

## 2017-09-28 ENCOUNTER — Ambulatory Visit: Payer: 59 | Admitting: Obstetrics & Gynecology

## 2017-09-28 ENCOUNTER — Ambulatory Visit (INDEPENDENT_AMBULATORY_CARE_PROVIDER_SITE_OTHER): Payer: 59

## 2017-09-28 ENCOUNTER — Encounter: Payer: Self-pay | Admitting: Obstetrics & Gynecology

## 2017-09-28 VITALS — BP 136/80 | HR 78 | Ht 65.0 in | Wt 315.0 lb

## 2017-09-28 DIAGNOSIS — N7093 Salpingitis and oophoritis, unspecified: Secondary | ICD-10-CM | POA: Diagnosis not present

## 2017-09-28 DIAGNOSIS — D259 Leiomyoma of uterus, unspecified: Secondary | ICD-10-CM | POA: Diagnosis not present

## 2017-09-28 DIAGNOSIS — N838 Other noninflammatory disorders of ovary, fallopian tube and broad ligament: Secondary | ICD-10-CM | POA: Diagnosis not present

## 2017-09-28 DIAGNOSIS — D219 Benign neoplasm of connective and other soft tissue, unspecified: Secondary | ICD-10-CM

## 2017-09-28 NOTE — Progress Notes (Signed)
PELVIC US TA/TV: enlarged,heterogeneous, arcuate, anteverted uterus w/a 9 x 8.5 x 8 cm posterior right fibroid,EEC 3.7 mm,normal right ovary,left oophorectomy,bilat adnexa's wnl,no free fluid

## 2017-09-28 NOTE — Progress Notes (Signed)
Follow up appointment for results  Chief Complaint  Patient presents with  . Follow-up    ultrasound    Blood pressure 136/80, pulse 78, height 5\' 5"  (1.651 m), weight (!) 315 lb (142.9 kg), last menstrual period 08/09/2016.    GYNECOLOGIC SONOGRAM   Amy Molina is a 57 y.o. G1P0010 postmenopausal women,she is here for a pelvic sonogram to f/u right tubo ovarian abscess.  Uterus                      15.3 x 9.5 x 9 cm, vol 684 ml,enlarged,heterogeneous, arcuate, anteverted uterus w/a 9 x 8.5 x 8 cm posterior right fibroid  Endometrium          3.7 mm, symmetrical, wnl  Right ovary              2.4 x 1.6 x 2.8 cm, wnl  Left ovary                Surgically absent  No free fluid   Technician Comments:  PELVIC US TA/TV: enlarged,heterogeneous, arcuate, anteverted uterus w/a 9 x 8.5 x 8 cm posterior right fibroid,EEC 3.7 mm,normal right ovary,left oophorectomy,bilat adnexa's wnl,no free fluid    Silver Huguenin 09/28/2017 11:04 AM  Clinical Impression and recommendations:  I have reviewed the sonogram results above, combined with the patient's current clinical course, below are my impressions and any appropriate recommendations for management based on the sonographic findings.  Resolved right TOA Uterus unchanged, 9 cm myoma, stable long term   Florian Buff 10/04/2017 7:19 PM    MEDS ordered this encounter: No orders of the defined types were placed in this encounter.   Orders for this encounter: No orders of the defined types were placed in this encounter.   Impression: Tubo-ovarian abscess  [name]    Plan: Follow up exam   Follow Up: Return in about 3 months (around 12/27/2017) for yearly, with Dr Elonda Husky.       Face to face time:  10 minutes  Greater than 50% of the visit time was spent in counseling and coordination of care with the patient.  The summary and outline of the counseling and care coordination  is summarized in the note above.   All questions were answered.  Past Medical History:  Diagnosis Date  . Anxiety   . Arthritis    general stiffness of joints  . Asthma   . Diabetes mellitus ([place])   . Heart murmur    childhood years- no mention in adult yrs  . Hypertension   . Morbid obesity ([place])   . Seasonal allergies     Past Surgical History:  Procedure Laterality Date  . COLONOSCOPY N/A 08/04/2015   Procedure: COLONOSCOPY;  Surgeon: Jerene Bears, MD;  Location: [facility];  Service: Gastroenterology;  Laterality: N/A;  . OOPHORECTOMY  05-2000   REMOVED LEFT TUBE AND OVARY  . WISDOM TOOTH EXTRACTION      OB History    Gravida  1   Para      Term      Preterm      AB  1   Living  0     SAB  1   TAB      Ectopic      Multiple      Live Births              Allergies  Allergen Reactions  . Penicillins  Swelling    Has patient had a PCN reaction causing immediate rash, facial/tongue/throat swelling, SOB or lightheadedness with hypotension: Yes Has patient had a PCN reaction causing severe rash involving mucus membranes or skin necrosis: No Has patient had a PCN reaction that required hospitalization Yes Has patient had a PCN reaction occurring within the last 10 years: Yes If all of the above answers are "NO", then may proceed with Cephalosporin use.  PATIENT TOLERATED ZOSYN AND MERREM ON 07/21/17 ADMISSION   . Floxin [Ofloxacin]     MIGRAINES, INSOMNIA, PARANOIND, RINGING IN EARS  . Lipitor [Atorvastatin]     Severe myalgias  . Sulfa Antibiotics     Social History   Socioeconomic History  . Marital status: Single    Spouse name: Not on file  . Number of children: Not on file  . Years of education: Not on file  . Highest education level: Not on file  Occupational History  . Not on file  Social Needs  . Financial resource strain: Not on file  . Food insecurity:    Worry: Not on file    Inability: Not on file  . Transportation  needs:    Medical: Not on file    Non-medical: Not on file  Tobacco Use  . Smoking status: Never Smoker  . Smokeless tobacco: Never Used  Substance and Sexual Activity  . Alcohol use: Yes    Alcohol/week: 0.0 oz    Comment: RARELY  . Drug use: No  . Sexual activity: Not Currently    Birth control/protection: None    Comment: 1st intercourse 57 yo-More than 5 partners  Lifestyle  . Physical activity:    Days per week: Not on file    Minutes per session: Not on file  . Stress: Not on file  Relationships  . Social connections:    Talks on phone: Not on file    Gets together: Not on file    Attends religious service: Not on file    Active member of club or organization: Not on file    Attends meetings of clubs or organizations: Not on file    Relationship status: Not on file  Other Topics Concern  . Not on file  Social History Narrative  . Not on file    Family History  Problem Relation Age of Onset  . Heart disease Mother   . Hypertension Father   . Heart disease Father   . Colon polyps Father   . COPD Father   . Diabetes Sister   . Diabetes Maternal Aunt        7 mat aunts diabetes  . Hypertension Maternal Aunt   . Lung disease Maternal Aunt   . Hypertension Paternal Aunt   . Colon cancer Neg Hx

## 2017-11-22 ENCOUNTER — Other Ambulatory Visit: Payer: Self-pay | Admitting: Occupational Medicine

## 2017-11-22 ENCOUNTER — Ambulatory Visit: Payer: Self-pay

## 2017-11-22 DIAGNOSIS — M25562 Pain in left knee: Secondary | ICD-10-CM

## 2017-12-12 ENCOUNTER — Encounter (HOSPITAL_COMMUNITY): Payer: Self-pay | Admitting: Emergency Medicine

## 2017-12-12 ENCOUNTER — Emergency Department (HOSPITAL_COMMUNITY): Payer: 59

## 2017-12-12 ENCOUNTER — Emergency Department (HOSPITAL_COMMUNITY)
Admission: EM | Admit: 2017-12-12 | Discharge: 2017-12-12 | Disposition: A | Discharge status: Home / Self Care | Payer: 59 | Attending: Emergency Medicine | Admitting: Emergency Medicine

## 2017-12-12 ENCOUNTER — Other Ambulatory Visit: Payer: Self-pay

## 2017-12-12 DIAGNOSIS — I10 Essential (primary) hypertension: Secondary | ICD-10-CM | POA: Insufficient documentation

## 2017-12-12 DIAGNOSIS — E119 Type 2 diabetes mellitus without complications: Secondary | ICD-10-CM | POA: Diagnosis not present

## 2017-12-12 DIAGNOSIS — J4541 Moderate persistent asthma with (acute) exacerbation: Secondary | ICD-10-CM | POA: Insufficient documentation

## 2017-12-12 DIAGNOSIS — Z7984 Long term (current) use of oral hypoglycemic drugs: Secondary | ICD-10-CM | POA: Diagnosis not present

## 2017-12-12 DIAGNOSIS — Z79899 Other long term (current) drug therapy: Secondary | ICD-10-CM | POA: Diagnosis not present

## 2017-12-12 DIAGNOSIS — R0600 Dyspnea, unspecified: Secondary | ICD-10-CM | POA: Insufficient documentation

## 2017-12-12 DIAGNOSIS — R0602 Shortness of breath: Secondary | ICD-10-CM | POA: Diagnosis present

## 2017-12-12 DIAGNOSIS — Z7982 Long term (current) use of aspirin: Secondary | ICD-10-CM | POA: Diagnosis not present

## 2017-12-12 DIAGNOSIS — J45901 Unspecified asthma with (acute) exacerbation: Secondary | ICD-10-CM

## 2017-12-12 LAB — BASIC METABOLIC PANEL
Anion gap: 12 (ref 5–15)
BUN: 11 mg/dL (ref 6–20)
CALCIUM: 8.9 mg/dL (ref 8.9–10.3)
CO2: 23 mmol/L (ref 22–32)
Chloride: 104 mmol/L (ref 101–111)
Creatinine, Ser: 0.76 mg/dL (ref 0.44–1.00)
GFR calc Af Amer: >60 mL/min (ref >60)
Glucose, Bld: 169 mg/dL — ABNORMAL HIGH (ref 65–99)
POTASSIUM: 3.8 mmol/L (ref 3.5–5.1)
Sodium: 139 mmol/L (ref 135–145)

## 2017-12-12 LAB — CBC WITH DIFFERENTIAL/PLATELET
BASOS ABS: 0 10*3/uL (ref 0.0–0.1)
Basophils Relative: 0 %
EOS PCT: 5 %
Eosinophils Absolute: 0.6 10*3/uL (ref 0.0–0.7)
HEMATOCRIT: 41.8 % (ref 36.0–46.0)
Hemoglobin: 13 g/dL (ref 12.0–15.0)
LYMPHS PCT: 30 %
Lymphs Abs: 3.8 10*3/uL (ref 0.7–4.0)
MCH: 30 pg (ref 26.0–34.0)
MCHC: 31.1 g/dL (ref 30.0–36.0)
MCV: 96.5 fL (ref 78.0–100.0)
MONO ABS: 1 10*3/uL (ref 0.1–1.0)
MONOS PCT: 8 %
NEUTROS ABS: 7.4 10*3/uL (ref 1.7–7.7)
Neutrophils Relative %: 57 %
PLATELETS: 225 10*3/uL (ref 150–400)
RBC: 4.33 MIL/uL (ref 3.87–5.11)
RDW: 13.8 % (ref 11.5–15.5)
WBC: 12.8 10*3/uL — ABNORMAL HIGH (ref 4.0–10.5)

## 2017-12-12 LAB — BRAIN NATRIURETIC PEPTIDE: B Natriuretic Peptide: 41 pg/mL (ref 0.0–100.0)

## 2017-12-12 LAB — D-DIMER, QUANTITATIVE: D-Dimer, Quant: 0.86 ug/mL-FEU — ABNORMAL HIGH (ref 0.00–0.50)

## 2017-12-12 LAB — TROPONIN I: Troponin I: <0.03 ng/mL (ref <0.03)

## 2017-12-12 MED ORDER — IOPAMIDOL (ISOVUE-370) INJECTION 76%
100.0000 mL | Freq: Once | INTRAVENOUS | Status: AC | PRN
  Administered 2017-12-12: 100 mL via INTRAVENOUS

## 2017-12-12 MED ORDER — PREDNISONE 50 MG PO TABS
60.0000 mg | ORAL_TABLET | Freq: Once | ORAL | Status: AC
  Administered 2017-12-12: 60 mg via ORAL
  Filled 2017-12-12: qty 1

## 2017-12-12 MED ORDER — ALBUTEROL SULFATE HFA 108 (90 BASE) MCG/ACT IN AERS: 1.0000 | INHALATION_SPRAY | Freq: Four times a day (QID) | RESPIRATORY_TRACT | 0 refills | Status: DC | PRN

## 2017-12-12 MED ORDER — IPRATROPIUM-ALBUTEROL 0.5-2.5 (3) MG/3ML IN SOLN
3.0000 mL | Freq: Once | RESPIRATORY_TRACT | Status: AC
  Administered 2017-12-12: 3 mL via RESPIRATORY_TRACT
  Filled 2017-12-12: qty 3

## 2017-12-12 NOTE — ED Provider Notes (Addendum)
[facility] EMERGENCY DEPARTMENT Provider Note   CSN: [id number] Arrival date & time: 12/12/17  1549     History   Chief Complaint Chief Complaint  Patient presents with  . Asthma    HPI Amy Molina is a 57 y.o. female.  HPI  57 year old female with history of hypertension, morbid obesity, anxiety and asthma comes in with chief complaint of worsening shortness of breath.  Patient has been feeling short of breath since the last 3 days, and she reports that normal amount of walking, as an example walking from parking lot to her home or from cafeteria to her work space patient is getting short of breath now.  Patient also has associated wheezing and has been taking breathing treatments.  Last night she woke up in the middle night feeling short of breath on 2 separate occasions and did not have significant response to the nebulizer treatment therefore she decided to come to the ER today.  Review of system is positive for chest discomfort in the midsternal region that is nonexertional.  Chest discomfort is also not pleuritic in nature.  Patient does not have a new cough.  Patient has well-controlled asthma overall, and typically she responds to nebulizer treatment at home.  She smokes occasionally.  Past Medical History:  Diagnosis Date  . Anxiety   . Arthritis    general stiffness of joints  . Asthma   . Diabetes mellitus ([place])   . Heart murmur    childhood years- no mention in adult yrs  . Hypertension   . Morbid obesity ([place])   . Seasonal allergies     Patient Active Problem List   Diagnosis Date Noted  . Abdominal pain   . [name]   . Tubo-ovarian abscess 07/21/2017  . Uncontrolled type 2 diabetes mellitus with hyperglycemia ([place]) 07/21/2017  . RLQ abdominal pain   . Screening for colon cancer   . Benign neoplasm of rectum   . Fibroid uterus 10/22/2014  . Shortness of breath 11/26/2013  . Hypertension 03/19/2012  . Diabetes mellitus, type 2  ([place]) 03/19/2012  . Obesity, morbid ([place]) 03/19/2012    Past Surgical History:  Procedure Laterality Date  . COLONOSCOPY N/A 08/04/2015   Procedure: COLONOSCOPY;  Surgeon: Jerene Bears, MD;  Location: [facility] ENDOSCOPY;  Service: Gastroenterology;  Laterality: N/A;  . OOPHORECTOMY  05-2000   REMOVED LEFT TUBE AND OVARY  . WISDOM TOOTH EXTRACTION       OB History    Gravida  1   Para      Term      Preterm      AB  1   Living  0     SAB  1   TAB      Ectopic      Multiple      Live Births               Home Medications    Prior to Admission medications   Medication Sig Start Date End Date Taking? Authorizing Provider  albuterol (PROVENTIL HFA;VENTOLIN HFA) 108 (90 Base) MCG/ACT inhaler Inhale 1-2 puffs into the lungs every 6 (six) hours as needed for wheezing or shortness of breath. 12/12/17   Varney Biles, MD  ALPRAZolam Duanne Moron) 0.5 MG tablet Take 1 tablet (0.5 mg total) by mouth 3 (three) times daily as needed for sleep or anxiety. Patient not taking: Reported on 08/10/2017 09/04/13   Huel Cote, NP  aspirin 81 MG  tablet Take 81 mg by mouth at bedtime.     [provider]  Cyanocobalamin (VITAMIN B-12 CR PO) Take 1 tablet by mouth daily.    [provider]  Fluticasone-Salmeterol (ADVAIR) 100-50 MCG/DOSE AEPB Inhale 1 puff into the lungs 2 (two) times daily as needed (wheezing).    [provider]  loratadine (CLARITIN) 10 MG tablet Take 10 mg by mouth daily.    [provider]  losartan (COZAAR) 50 MG tablet Take 1 tablet (50 mg total) by mouth daily. Patient taking differently: Take 50 mg by mouth at bedtime.  11/27/13   Susy Frizzle, MD  metFORMIN (GLUCOPHAGE) 1000 MG tablet Take 1 tablet (1,000 mg total) by mouth daily with breakfast. 11/27/13   Susy Frizzle, MD  mometasone-formoterol (DULERA) 200-5 MCG/ACT AERO Inhale 2 puffs 2 (two) times daily into the lungs. 07/26/17   Florian Buff, MD  Multiple Vitamin  (MULTIVITAMIN WITH MINERALS) TABS tablet Take 1 tablet by mouth every morning.    [provider]  naproxen sodium (ANAPROX) 220 MG tablet Take 440-660 mg by mouth 2 (two) times daily as needed (pain.).     [provider]  ondansetron (ZOFRAN ODT) 8 MG disintegrating tablet Take 1 tablet (8 mg total) every 8 (eight) hours as needed by mouth for nausea or vomiting. Patient not taking: Reported on 09/28/2017 07/26/17   Florian Buff, MD  oxyCODONE (OXY IR/ROXICODONE) 5 MG immediate release tablet Take 1 tablet (5 mg total) every 4 (four) hours as needed by mouth for moderate pain. 07/26/17   Florian Buff, MD  ranitidine (ZANTAC) 150 MG tablet Take 150 mg by mouth at bedtime.     [provider]    Family History Family History  Problem Relation Age of Onset  . Heart disease Mother   . Hypertension Father   . Heart disease Father   . Colon polyps Father   . Diabetes Sister   . Diabetes Maternal Aunt        7 mat aunts diabetes  . Hypertension Maternal Aunt   . Lung disease Maternal Aunt   . Hypertension Paternal Aunt   . Colon cancer Neg Hx     Social History Social History   Tobacco Use  . Smoking status: Never Smoker  . Smokeless tobacco: Never Used  Substance Use Topics  . Alcohol use: Yes    Alcohol/week: 0.0 oz    Comment: RARELY  . Drug use: No     Allergies   Penicillins; Floxin [ofloxacin]; and Lipitor [atorvastatin]   Review of Systems Review of Systems  Constitutional: Positive for activity change.  Respiratory: Positive for cough and shortness of breath.   Cardiovascular: Positive for chest pain.  Gastrointestinal: Negative for nausea.     Physical Exam Updated Vital Signs BP (!) 151/69   Pulse 89   Temp 97.8 F (36.6 C) (Oral)   Resp (!) 25   Ht 5\' 5"  (1.651 m)   Wt (!) 142.4 kg (314 lb)   LMP 08/09/2016   SpO2 96%   BMI 52.25 kg/m   Physical Exam  Constitutional: She is oriented to person, place, and time. She  appears well-developed.  HENT:  Head: Normocephalic and atraumatic.  Eyes: EOM are normal.  Neck: Normal range of motion. Neck supple.  Cardiovascular: Normal rate.  Pulmonary/Chest: Effort normal and breath sounds normal. She has no wheezes.  Abdominal: Bowel sounds are normal.  Neurological: She is alert and oriented to  person, place, and time.  Skin: Skin is warm and dry.  Nursing note and vitals reviewed.    ED Treatments / Results  Labs (all labs ordered are listed, but only abnormal results are displayed) Labs Reviewed  BASIC METABOLIC PANEL - Abnormal; Notable for the following components:      Result Value   Glucose, Bld 169 (*)    All other components within normal limits  CBC WITH DIFFERENTIAL/PLATELET - Abnormal; Notable for the following components:   WBC 12.8 (*)    All other components within normal limits  D-DIMER, QUANTITATIVE (NOT AT [facility]) - Abnormal; Notable for the following components:   D-Dimer, Quant 0.86 (*)    All other components within normal limits  TROPONIN I  BRAIN NATRIURETIC PEPTIDE    EKG EKG Interpretation  Date/Time:  Tuesday December 12 2017 16:02:17 EDT Ventricular Rate:  97 PR Interval:    QRS Duration: 95 QT Interval:  373 QTC Calculation: 474 R Axis:   -49 Text Interpretation:  Sinus rhythm Multiple ventricular premature complexes LAD, consider left anterior fascicular block Left ventricular hypertrophy Confirmed by Varney Biles [id number]) on 12/12/2017 4:27:45 PM   Radiology Dg Chest 2 View  Result Date: 12/12/2017 CLINICAL DATA:  57 y/o F; cough, shortness of breath, with onset 2 days ago. EXAM: CHEST - 2 VIEW COMPARISON:  07/22/2017 chest radiograph FINDINGS: Borderline cardiomegaly given projection and technique. Pulmonary vascular congestion. No consolidation, effusion, or pneumothorax. Degenerative changes of thoracic spine. No acute osseous abnormality is evident IMPRESSION: Borderline cardiomegaly. Pulmonary vascular congestion.  No consolidation. Electronically Signed   By: Kristine Garbe M.D.   On: 12/12/2017 17:31   Ct Angio Chest Pe W And/or Wo Contrast  Result Date: 12/12/2017 CLINICAL DATA:  Shortness of breath for 2 days.  Elevated d-dimer. EXAM: CT ANGIOGRAPHY CHEST WITH CONTRAST TECHNIQUE: Multidetector CT imaging of the chest was performed using the standard protocol during bolus administration of intravenous contrast. Multiplanar CT image reconstructions and MIPs were obtained to evaluate the vascular anatomy. CONTRAST:  100 ml ISOVUE-370 IOPAMIDOL (ISOVUE-370) INJECTION 76% COMPARISON:  PA and lateral chest 12/12/2016. FINDINGS: Cardiovascular: The exam is degraded by respiratory motion but no pulmonary embolus is identified. There is cardiomegaly. No pericardial effusion. A few scattered aortic atherosclerotic calcifications are noted. Mediastinum/Nodes: No enlarged mediastinal, hilar, or axillary lymph nodes. Thyroid gland, trachea, and esophagus demonstrate no significant findings. Lungs/Pleura: No pleural effusion. Mild, scattered ground glass attenuation is seen. Upper Abdomen: Fatty infiltration of the liver is noted. Otherwise negative. Musculoskeletal: No acute bony abnormality. Review of the MIP images confirms the above findings. IMPRESSION: Negative for pulmonary embolus. Scattered ground glass attenuation is likely due to atelectasis. Cardiomegaly. Aortic Atherosclerosis (ICD10-I70.0). Electronically Signed   By: Inge Rise M.D.   On: 12/12/2017 18:56    Procedures Procedures (including critical care time)  Medications Ordered in ED Medications  ipratropium-albuterol (DUONEB) 0.5-2.5 (3) MG/3ML nebulizer solution 3 mL (3 mLs Nebulization Given 12/12/17 1647)  iopamidol (ISOVUE-370) 76 % injection 100 mL (100 mLs Intravenous Contrast Given 12/12/17 1835)  predniSONE (DELTASONE) tablet 60 mg (60 mg Oral Given 12/12/17 2101)     Initial Impression / Assessment and Plan / ED Course  I have  reviewed the triage vital signs and the nursing notes.  Pertinent labs & imaging results that were available during my care of the patient were reviewed by me and considered in my medical decision making (see chart for details).  Clinical Course as of Dec 13 2119  Tue Dec 12, 2017  2051 Results from the ER workup discussed with the patient face to face and all questions answered to the best of my ability.  I do not see the need for repeat troponin. I have advised patient to see her primary care doctor in 1 week. We will treat her as if she has asthma exacerbation causing shortness of breath, she if she is not getting better she will have her primary care doctor to further probe.  CT Angio Chest PE W and/or Wo Contrast [AN]    Clinical Course User Index [AN] Varney Biles, MD    57 year old female comes in with chief complaint of shortness of breath.  Patient has history of asthma and morbid obesity.  She is having exertional dyspnea with some chest discomfort.  Differential diagnosis includes ACS, CHF, PE, asthma exacerbation, pneumonia.  We will initiate basic workup which will include troponin, BNP, d-dimer and chest x-ray.  Patient's lungs are clear therefore I doubt that asthma exacerbation is the etiology.  Final Clinical Impressions(s) / ED Diagnoses   Final diagnoses:  Moderate asthma with exacerbation, unspecified whether persistent  Acute dyspnea    ED Discharge Orders        Ordered    albuterol (PROVENTIL HFA;VENTOLIN HFA) 108 (90 Base) MCG/ACT inhaler  Every 6 hours PRN     12/12/17 2052       Varney Biles, MD 12/12/17 1755    Varney Biles, MD 12/12/17 2121

## 2017-12-12 NOTE — Discharge Instructions (Signed)
We saw you in the ER for your asthma related complains. We gave you some breathing treatments in the ER, and seems like your symptoms have improved. Please take albuterol as needed every 4 hours. Please take the medications prescribed. Please refrain from smoking or smoke exposure. Please see a primary care doctor in 1 week. Return to the ER if your symptoms worsen.  

## 2017-12-12 NOTE — ED Notes (Signed)
Ambulated pt with Sp02 on finger started  at 96% full circle around nursing station dropped down to 93%.

## 2017-12-12 NOTE — ED Notes (Signed)
Respiratory paged at this time for tx.  

## 2017-12-12 NOTE — ED Triage Notes (Signed)
Onset 2 days ago, increased sob, cough with yellow sputum. Use 3 albuterol treatment today, without relief.

## 2017-12-12 NOTE — ED Notes (Signed)
Patient transported to X-ray 

## 2017-12-17 ENCOUNTER — Emergency Department (HOSPITAL_COMMUNITY): Payer: 59

## 2017-12-17 ENCOUNTER — Encounter (HOSPITAL_COMMUNITY): Payer: Self-pay | Admitting: Emergency Medicine

## 2017-12-17 ENCOUNTER — Emergency Department (HOSPITAL_COMMUNITY)
Admission: EM | Admit: 2017-12-17 | Discharge: 2017-12-17 | Disposition: A | Discharge status: Home / Self Care | Payer: 59 | Attending: Emergency Medicine | Admitting: Emergency Medicine

## 2017-12-17 ENCOUNTER — Other Ambulatory Visit: Payer: Self-pay

## 2017-12-17 DIAGNOSIS — R1084 Generalized abdominal pain: Secondary | ICD-10-CM

## 2017-12-17 DIAGNOSIS — N7093 Salpingitis and oophoritis, unspecified: Secondary | ICD-10-CM | POA: Insufficient documentation

## 2017-12-17 DIAGNOSIS — I10 Essential (primary) hypertension: Secondary | ICD-10-CM | POA: Diagnosis not present

## 2017-12-17 DIAGNOSIS — Z7984 Long term (current) use of oral hypoglycemic drugs: Secondary | ICD-10-CM | POA: Insufficient documentation

## 2017-12-17 DIAGNOSIS — J45909 Unspecified asthma, uncomplicated: Secondary | ICD-10-CM | POA: Insufficient documentation

## 2017-12-17 DIAGNOSIS — Z7982 Long term (current) use of aspirin: Secondary | ICD-10-CM | POA: Diagnosis not present

## 2017-12-17 DIAGNOSIS — Z79899 Other long term (current) drug therapy: Secondary | ICD-10-CM | POA: Insufficient documentation

## 2017-12-17 DIAGNOSIS — E1165 Type 2 diabetes mellitus with hyperglycemia: Secondary | ICD-10-CM | POA: Diagnosis not present

## 2017-12-17 LAB — COMPREHENSIVE METABOLIC PANEL
ALBUMIN: 3.7 g/dL (ref 3.5–5.0)
ALT: 18 U/L (ref 14–54)
AST: 18 U/L (ref 15–41)
Alkaline Phosphatase: 86 U/L (ref 38–126)
Anion gap: 11 (ref 5–15)
BUN: 8 mg/dL (ref 6–20)
CHLORIDE: 102 mmol/L (ref 101–111)
CO2: 26 mmol/L (ref 22–32)
Calcium: 9.4 mg/dL (ref 8.9–10.3)
Creatinine, Ser: 0.76 mg/dL (ref 0.44–1.00)
GFR calc Af Amer: >60 mL/min (ref >60)
GFR calc non Af Amer: >60 mL/min (ref >60)
Glucose, Bld: 151 mg/dL — ABNORMAL HIGH (ref 65–99)
Potassium: 3.7 mmol/L (ref 3.5–5.1)
SODIUM: 139 mmol/L (ref 135–145)
Total Bilirubin: 1.7 mg/dL — ABNORMAL HIGH (ref 0.3–1.2)
Total Protein: 8.1 g/dL (ref 6.5–8.1)

## 2017-12-17 LAB — URINALYSIS, ROUTINE W REFLEX MICROSCOPIC
Bilirubin Urine: NEGATIVE
Glucose, UA: NEGATIVE mg/dL
Hgb urine dipstick: NEGATIVE
Ketones, ur: NEGATIVE mg/dL
Nitrite: NEGATIVE
PH: 7 (ref 5.0–8.0)
Protein, ur: NEGATIVE mg/dL
Specific Gravity, Urine: 1.014 (ref 1.005–1.030)

## 2017-12-17 LAB — CBC WITH DIFFERENTIAL/PLATELET
Basophils Absolute: 0 10*3/uL (ref 0.0–0.1)
Basophils Relative: 0 %
Eosinophils Absolute: 0.2 10*3/uL (ref 0.0–0.7)
Eosinophils Relative: 1 %
HEMATOCRIT: 45.5 % (ref 36.0–46.0)
Hemoglobin: 14.3 g/dL (ref 12.0–15.0)
LYMPHS ABS: 2.9 10*3/uL (ref 0.7–4.0)
LYMPHS PCT: 15 %
MCH: 30.4 pg (ref 26.0–34.0)
MCHC: 31.4 g/dL (ref 30.0–36.0)
MCV: 96.8 fL (ref 78.0–100.0)
MONO ABS: 1.9 10*3/uL — AB (ref 0.1–1.0)
Monocytes Relative: 10 %
Neutro Abs: 14.5 10*3/uL — ABNORMAL HIGH (ref 1.7–7.7)
Neutrophils Relative %: 74 %
PLATELETS: 236 10*3/uL (ref 150–400)
RBC: 4.7 MIL/uL (ref 3.87–5.11)
RDW: 13.8 % (ref 11.5–15.5)
WBC: 19.5 10*3/uL — ABNORMAL HIGH (ref 4.0–10.5)

## 2017-12-17 LAB — WET PREP, GENITAL
CLUE CELLS WET PREP: NONE SEEN
Sperm: NONE SEEN
TRICH WET PREP: NONE SEEN
YEAST WET PREP: NONE SEEN

## 2017-12-17 LAB — LIPASE, BLOOD: Lipase: 25 U/L (ref 11–51)

## 2017-12-17 MED ORDER — METRONIDAZOLE 500 MG PO TABS
500.0000 mg | ORAL_TABLET | Freq: Once | ORAL | Status: AC
Start: 2017-12-17 — End: 2017-12-17
  Administered 2017-12-17: 500 mg via ORAL
  Filled 2017-12-17: qty 1

## 2017-12-17 MED ORDER — IOPAMIDOL (ISOVUE-300) INJECTION 61%
100.0000 mL | Freq: Once | INTRAVENOUS | Status: AC | PRN
  Administered 2017-12-17: 100 mL via INTRAVENOUS

## 2017-12-17 MED ORDER — OXYCODONE-ACETAMINOPHEN 5-325 MG PO TABS
2.0000 | ORAL_TABLET | Freq: Once | ORAL | Status: AC
  Administered 2017-12-17: 2 via ORAL
  Filled 2017-12-17: qty 2

## 2017-12-17 MED ORDER — OXYCODONE-ACETAMINOPHEN 5-325 MG PO TABS: 1.0000 | ORAL_TABLET | ORAL | 0 refills | Status: DC | PRN

## 2017-12-17 MED ORDER — DOXYCYCLINE HYCLATE 100 MG PO CAPS: 100.0000 mg | ORAL_CAPSULE | Freq: Two times a day (BID) | ORAL | 0 refills | Status: DC

## 2017-12-17 MED ORDER — METRONIDAZOLE 500 MG PO TABS: 500.0000 mg | ORAL_TABLET | Freq: Two times a day (BID) | ORAL | 0 refills | Status: DC

## 2017-12-17 MED ORDER — SODIUM CHLORIDE 0.9 % IV BOLUS
1000.0000 mL | Freq: Once | INTRAVENOUS | Status: AC
  Administered 2017-12-17: 1000 mL via INTRAVENOUS

## 2017-12-17 MED ORDER — DOXYCYCLINE HYCLATE 100 MG PO TABS
100.0000 mg | ORAL_TABLET | Freq: Once | ORAL | Status: AC
  Administered 2017-12-17: 100 mg via ORAL
  Filled 2017-12-17: qty 1

## 2017-12-17 MED ORDER — ACETAMINOPHEN 325 MG PO TABS
650.0000 mg | ORAL_TABLET | Freq: Once | ORAL | Status: AC
  Administered 2017-12-17: 650 mg via ORAL
  Filled 2017-12-17: qty 2

## 2017-12-17 MED ORDER — MORPHINE SULFATE (PF) 4 MG/ML IV SOLN
4.0000 mg | Freq: Once | INTRAVENOUS | Status: AC
  Administered 2017-12-17: 4 mg via INTRAVENOUS
  Filled 2017-12-17: qty 1

## 2017-12-17 MED ORDER — ONDANSETRON HCL 4 MG/2ML IJ SOLN
4.0000 mg | Freq: Once | INTRAMUSCULAR | Status: AC
  Administered 2017-12-17: 4 mg via INTRAVENOUS
  Filled 2017-12-17: qty 2

## 2017-12-17 NOTE — ED Provider Notes (Signed)
[facility] EMERGENCY DEPARTMENT Provider Note   CSN: [id number] Arrival date & time: 12/17/17  1708     History   Chief Complaint Chief Complaint  Patient presents with  . Abdominal Pain    HPI Amy Molina is a 57 y.o. female.  57 year old female complaining of diffuse spasming moderate to severe abdominal pain since Saturday morning.  She states she had a taco salad Friday evening and then awoke overnight with this pain.  It seems to be shifting from her upper abdomen to her lower abdomen but does not radiate into the chest or back.  It is associated with some nausea.  There is been no vomiting and no diarrhea.  She has been a little constipated.  There is been no blood from either end.  She is a prior history of an oophorectomy.  She did try some Zofran that she had leftover which help with the nausea and tried some Imodium which did nothing except make her urine smelled funny.  She denies any other urine symptoms.  She is a diabetic and states her sugars have been elevated since being sick.  She states she had a history of IBD but has not had any symptoms in 10 years, but they were something like this when she had them.  The history is provided by the patient.  Abdominal Pain   This is a new problem. The current episode started yesterday. The problem occurs constantly. The problem has not changed since onset.The pain is associated with an unknown factor. The pain is located in the generalized abdominal region. The quality of the pain is cramping. The pain is moderate. Associated symptoms include fever, nausea and constipation. Pertinent negatives include diarrhea, hematochezia, melena, vomiting, dysuria, frequency, hematuria, headaches, arthralgias and myalgias. Nothing aggravates the symptoms. Nothing relieves the symptoms. Her past medical history is significant for irritable bowel syndrome.    Past Medical History:  Diagnosis Date  . Anxiety   . Arthritis    general stiffness of  joints  . Asthma   . Diabetes mellitus ([place])   . Heart murmur    childhood years- no mention in adult yrs  . Hypertension   . Morbid obesity ([place])   . Seasonal allergies     Patient Active Problem List   Diagnosis Date Noted  . Abdominal pain   . [name]   . Tubo-ovarian abscess 07/21/2017  . Uncontrolled type 2 diabetes mellitus with hyperglycemia ([place]) 07/21/2017  . RLQ abdominal pain   . Screening for colon cancer   . Benign neoplasm of rectum   . Fibroid uterus 10/22/2014  . Shortness of breath 11/26/2013  . Hypertension 03/19/2012  . Diabetes mellitus, type 2 ([place]) 03/19/2012  . Obesity, morbid ([place]) 03/19/2012    Past Surgical History:  Procedure Laterality Date  . COLONOSCOPY N/A 08/04/2015   Procedure: COLONOSCOPY;  Surgeon: Jerene Bears, MD;  Location: [facility];  Service: Gastroenterology;  Laterality: N/A;  . OOPHORECTOMY  05-2000   REMOVED LEFT TUBE AND OVARY  . WISDOM TOOTH EXTRACTION       OB History    Gravida  1   Para      Term      Preterm      AB  1   Living  0     SAB  1   TAB      Ectopic      Multiple      Live Births  Home Medications    Prior to Admission medications   Medication Sig Start Date End Date Taking? Authorizing Provider  albuterol (PROVENTIL HFA;VENTOLIN HFA) 108 (90 Base) MCG/ACT inhaler Inhale 1-2 puffs into the lungs every 6 (six) hours as needed for wheezing or shortness of breath. 12/12/17   Varney Biles, MD  ALPRAZolam Duanne Moron) 0.5 MG tablet Take 1 tablet (0.5 mg total) by mouth 3 (three) times daily as needed for sleep or anxiety. Patient not taking: Reported on 08/10/2017 09/04/13   Huel Cote, NP  aspirin 81 MG tablet Take 81 mg by mouth at bedtime.     [provider]  Cyanocobalamin (VITAMIN B-12 CR PO) Take 1 tablet by mouth daily.    [provider]  Fluticasone-Salmeterol (ADVAIR) 100-50 MCG/DOSE AEPB Inhale 1 puff into the lungs 2 (two)  times daily as needed (wheezing).    [provider]  loratadine (CLARITIN) 10 MG tablet Take 10 mg by mouth daily.    [provider]  losartan (COZAAR) 50 MG tablet Take 1 tablet (50 mg total) by mouth daily. Patient taking differently: Take 50 mg by mouth at bedtime.  11/27/13   Susy Frizzle, MD  metFORMIN (GLUCOPHAGE) 1000 MG tablet Take 1 tablet (1,000 mg total) by mouth daily with breakfast. 11/27/13   Susy Frizzle, MD  mometasone-formoterol (DULERA) 200-5 MCG/ACT AERO Inhale 2 puffs 2 (two) times daily into the lungs. 07/26/17   Florian Buff, MD  Multiple Vitamin (MULTIVITAMIN WITH MINERALS) TABS tablet Take 1 tablet by mouth every morning.    [provider]  naproxen sodium (ANAPROX) 220 MG tablet Take 440-660 mg by mouth 2 (two) times daily as needed (pain.).     [provider]  ondansetron (ZOFRAN ODT) 8 MG disintegrating tablet Take 1 tablet (8 mg total) every 8 (eight) hours as needed by mouth for nausea or vomiting. Patient not taking: Reported on 09/28/2017 07/26/17   Florian Buff, MD  oxyCODONE (OXY IR/ROXICODONE) 5 MG immediate release tablet Take 1 tablet (5 mg total) every 4 (four) hours as needed by mouth for moderate pain. 07/26/17   Florian Buff, MD  ranitidine (ZANTAC) 150 MG tablet Take 150 mg by mouth at bedtime.     [provider]    Family History Family History  Problem Relation Age of Onset  . Heart disease Mother   . Hypertension Father   . Heart disease Father   . Colon polyps Father   . Diabetes Sister   . Diabetes Maternal Aunt        7 mat aunts diabetes  . Hypertension Maternal Aunt   . Lung disease Maternal Aunt   . Hypertension Paternal Aunt   . Colon cancer Neg Hx     Social History Social History   Tobacco Use  . Smoking status: Never Smoker  . Smokeless tobacco: Never Used  Substance Use Topics  . Alcohol use: Yes    Alcohol/week: 0.0 oz    Comment: RARELY  . Drug use: No      Allergies   Penicillins; Floxin [ofloxacin]; and Lipitor [atorvastatin]   Review of Systems Review of Systems  Constitutional: Positive for fever. Negative for chills.  HENT: Negative for ear pain and sore throat.   Eyes: Negative for pain and visual disturbance.  Respiratory: Negative for cough and shortness of breath.   Cardiovascular: Negative for chest pain and palpitations.  Gastrointestinal: Positive for abdominal pain, constipation and nausea. Negative for diarrhea,  hematochezia, melena and vomiting.  Genitourinary: Negative for dysuria, frequency, hematuria, vaginal bleeding and vaginal discharge.  Musculoskeletal: Negative for arthralgias, back pain and myalgias.  Skin: Negative for color change and rash.  Neurological: Negative for seizures, syncope and headaches.  All other systems reviewed and are negative.    Physical Exam Updated Vital Signs BP 137/80 (BP Location: Right Arm)   Pulse (!) 113   Temp 99.2 F (37.3 C) (Oral)   Resp (!) 22   Ht 5\' 5"  (1.651 m)   Wt (!) 139.7 kg (308 lb)   LMP 08/09/2016   SpO2 94%   BMI 51.25 kg/m   Physical Exam  Constitutional: She appears well-developed and well-nourished. No distress.  HENT:  Head: Normocephalic and atraumatic.  Mouth/Throat: Oropharynx is clear and moist.  Eyes: Conjunctivae are normal.  Neck: Neck supple.  Cardiovascular: Regular rhythm and intact distal pulses. Tachycardia present.  No murmur heard. Pulmonary/Chest: Effort normal and breath sounds normal. No stridor. No respiratory distress. She has no wheezes.  Abdominal: Soft. Normal appearance and bowel sounds are normal. There is generalized tenderness. There is no rigidity, no rebound and no guarding.  Genitourinary: Vagina normal. There is no rash or tenderness on the right labia. There is no rash or tenderness on the left labia. Uterus is not tender. Cervix exhibits no motion tenderness and no discharge. Right adnexum displays tenderness.  Left adnexum displays no tenderness. No erythema in the vagina. No foreign body in the vagina. No signs of injury around the vagina.  Genitourinary Comments: Chaperone ED nurse was present for the entire exam.  Musculoskeletal: Normal range of motion. She exhibits no edema or tenderness.  Neurological: She is alert. GCS eye subscore is 4. GCS verbal subscore is 5. GCS motor subscore is 6.  Skin: Skin is warm and dry.  Psychiatric: She has a normal mood and affect.  Nursing note and vitals reviewed.    ED Treatments / Results  Labs (all labs ordered are listed, but only abnormal results are displayed) Labs Reviewed  WET PREP, GENITAL - Abnormal; Notable for the following components:      Result Value   WBC, Wet Prep HPF POC FEW (*)    All other components within normal limits  COMPREHENSIVE METABOLIC PANEL - Abnormal; Notable for the following components:   Glucose, Bld 151 (*)    Total Bilirubin 1.7 (*)    All other components within normal limits  CBC WITH DIFFERENTIAL/PLATELET - Abnormal; Notable for the following components:   WBC 19.5 (*)    Neutro Abs 14.5 (*)    Monocytes Absolute 1.9 (*)    All other components within normal limits  URINALYSIS, ROUTINE W REFLEX MICROSCOPIC - Abnormal; Notable for the following components:   Leukocytes, UA TRACE (*)    Bacteria, UA RARE (*)    Squamous Epithelial / LPF 0-5 (*)    All other components within normal limits  LIPASE, BLOOD  GC/CHLAMYDIA PROBE AMP ([place]) NOT AT [facility]    EKG None  Radiology Ct Abdomen Pelvis W Contrast  Result Date: 12/17/2017 CLINICAL DATA:  57 year old female with abdominal pain and cramping with fever since Saturday. EXAM: CT ABDOMEN AND PELVIS WITH CONTRAST TECHNIQUE: Multidetector CT imaging of the abdomen and pelvis was performed using the standard protocol following bolus administration of intravenous contrast. CONTRAST:  172mL ISOVUE-300 IOPAMIDOL (ISOVUE-300) INJECTION 61% COMPARISON:   07/21/2017 CT FINDINGS: Lower chest: There is atelectasis at each lung base. There is mild cardiomegaly without  pericardial effusion. Hepatobiliary: No space-occupying mass of the liver. No biliary dilatation. Physiologic distention of the gallbladder without stones. Pancreas: Normal without ductal dilatation, enhancing mass or inflammation. Spleen: Normal sized spleen with small adjacent splenule. Adrenals/Urinary Tract: Normal bilateral adrenal glands. Symmetric enhancement of both kidneys without nephrolithiasis. 6 mm interpolar right renal hypodensity statistically consistent with a cyst is noted, too small to further characterize. No hydroureteronephrosis. Decompressed urinary bladder. Stomach/Bowel: The stomach is decompressed without inflammatory thickening or mass. Normal small bowel rotation without obstruction or inflammation. Appendix is air-filled and normal in caliber. Average amount of fecal retention within the colon without acute bowel obstruction. Vascular/Lymphatic: No aortic aneurysm.  No lymphadenopathy. Reproductive: The patient is status post left oophorectomy. Mild enlargement of the right ovary with multi septated cyst or adjacent follicles measuring in aggregate 3.7 x 3.8 x 3.3 cm, series 2, image 63 and series 5, image 52. As before, there is a moderate degree of mesenteric edema and inflammatory change surrounding the right ovary and uterus with small volume of fluid overlying the ventral aspect of the uterus. The uterus is anteverted. Given normal appearance of the adjacent appendix, findings more likely related to pelvic inflammatory disease potentially representing early changes of tubo-ovarian abscess. Sympathetic thickening of the adjacent sigmoid colon traversing this area is identified. No evidence of diverticulosis or diverticulitis. The uterus is anteverted without enhancing mass. Other: Redemonstration of small fat containing supraumbilical and umbilical hernias. Musculoskeletal:  Degenerative changes of the lower thoracic and lumbar spine with lower lumbar facet arthropathy. Moderate to severe spinal stenosis at L4-5 secondary to facet hypertrophy and bulging disc. Disc-osteophyte complex at L3-4. No aggressive osseous lesions. IMPRESSION: 1. Similar inflammatory change in a right lower quadrant and pelvis likely representing stigmata of pelvic inflammatory disease and/or early tubo-ovarian abscess as on the prior exam. The adjacent appendix is normal and findings are not believed to represent changes of appendicitis or inflammatory bowel. 2. Multiple adjacent follicles within the right ovary versus a multi septated complex cyst of the right ovary, overall measuring 3.7 x 3.8 x 3.3 cm. Status post left oophorectomy. 3. Chronic stable changes otherwise noted. Electronically Signed   By: Ashley Royalty M.D.   On: 12/17/2017 19:37    Procedures Procedures (including critical care time)  Medications Ordered in ED Medications  morphine 4 MG/ML injection 4 mg (has no administration in time range)  sodium chloride 0.9 % bolus 1,000 mL (has no administration in time range)  ondansetron (ZOFRAN) injection 4 mg (has no administration in time range)     Initial Impression / Assessment and Plan / ED Course  I have reviewed the triage vital signs and the nursing notes.  Pertinent labs & imaging results that were available during my care of the patient were reviewed by me and considered in my medical decision making (see chart for details).  Clinical Course as of Dec 19 1219  Nancy Fetter Dec 17, 2017  2123 pH: 7.0 [MB]  2124 Cussed with Dr. Sherin Quarry gynecology on call.  He recommends that the patient be placed back on Cipro and Flagyl and given a prescription for some pain medicine and can follow-up with him outpatient next week.  I reviewed this with the patient and she is comfortable with the plan.   [MB]  2129 I reviewed the allergy and she is allergic to penicillin and quinolones.  The last  time she was discharged they sent her out on Doxy and Flagyl so I will proceed with that.   [  MB]    Clinical Course User Index [MB] Hayden Rasmussen, MD      Final Clinical Impressions(s) / ED Diagnoses   Final diagnoses:  Generalized abdominal pain  Tubo-ovarian inflammatory disease    ED Discharge Orders        Ordered    oxyCODONE-acetaminophen (PERCOCET/ROXICET) 5-325 MG tablet  Every 4 hours PRN     12/17/17 2131    doxycycline (VIBRAMYCIN) 100 MG capsule  2 times daily     12/17/17 2131    metroNIDAZOLE (FLAGYL) 500 MG tablet  2 times daily     12/17/17 2131       Hayden Rasmussen, MD 12/18/17 1221

## 2017-12-17 NOTE — Discharge Instructions (Addendum)
Your evaluated in the emergency department for abdominal pain and fever.  By CAT scan and blood work we did find that you have the continuation of the inflammation around your right tube and ovary.  This may be the cause of the pain and after discussion with gynecology we are starting you on some antibiotics.  You should continue to take Tylenol and ibuprofen for fever,  and we are prescribing you some Percocet to help with pain.  Please follow-up with Dr. Elonda Husky this week and call him or return to the emergency department if any worsening symptoms.

## 2017-12-17 NOTE — ED Notes (Signed)
Patient transported to CT 

## 2017-12-17 NOTE — ED Triage Notes (Signed)
Pt sent from urc for abd pain and cramping with a fever since Saturday she has not had anything for fever

## 2017-12-19 LAB — GC/CHLAMYDIA PROBE AMP (~~LOC~~) NOT AT ARMC
CHLAMYDIA, DNA PROBE: NEGATIVE
Neisseria Gonorrhea: NEGATIVE

## 2017-12-20 ENCOUNTER — Telehealth: Payer: Self-pay | Admitting: *Deleted

## 2017-12-20 ENCOUNTER — Telehealth: Payer: Self-pay | Admitting: Obstetrics & Gynecology

## 2017-12-20 MED ORDER — OXYCODONE-ACETAMINOPHEN 5-325 MG PO TABS: 1.0000 | ORAL_TABLET | ORAL | 0 refills | Status: DC | PRN

## 2017-12-20 NOTE — Telephone Encounter (Signed)
Patient states she needs to be seen right away because she is still experiencing abdominal pain.  She was only given 15 tablets of percocet and knows she will be out before the weekend.  She has an appointment on Tuesday but is saying she needs to be seen sooner since she is now having hot and cold chills along with spitting up blood. I informed the patient that Dr Elonda Husky was in surgery this morning but would pass the message along to him. Pt verbalized understanding.  PLease advise.

## 2017-12-20 NOTE — Telephone Encounter (Signed)
Spoke to patient and she states that she would like to just get a refill on the pain medicine and try to make it until her appointment. She states she has enough medicine for 1.5 days. Advised patient is her symptoms worsen she will need to go be evalutated before Tuesday.

## 2017-12-20 NOTE — Telephone Encounter (Signed)
Meds ordered this encounter  Medications  . DISCONTD: oxyCODONE-acetaminophen (PERCOCET/ROXICET) 5-325 MG tablet    Sig: Take 1-2 tablets by mouth every 4 (four) hours as needed for severe pain.    Dispense:  15 tablet    Refill:  0  . oxyCODONE-acetaminophen (PERCOCET/ROXICET) 5-325 MG tablet    Sig: Take 1-2 tablets by mouth every 4 (four) hours as needed for severe pain.    Dispense:  15 tablet    Refill:  0    One prescription was prinetedf by mistake than I e prescribed the other

## 2017-12-20 NOTE — Telephone Encounter (Signed)
If patient feels she is worse then I recommend she go to [facility].  I am on call tonight and am not sure about our weekend coverage here at [facility], I am not on and will not be available if she needs admission.  Otherwise if she just needs a few more pain tablets I can send a few in to last til Tuesday  See what she says

## 2017-12-20 NOTE — Telephone Encounter (Signed)
Amy Molina spoke to patient.

## 2017-12-26 ENCOUNTER — Other Ambulatory Visit: Payer: Self-pay

## 2017-12-26 ENCOUNTER — Other Ambulatory Visit (HOSPITAL_COMMUNITY)
Admission: RE | Admit: 2017-12-26 | Discharge: 2017-12-26 | Disposition: A | Discharge status: Home / Self Care | Payer: 59 | Source: Ambulatory Visit | Attending: Obstetrics & Gynecology | Admitting: Obstetrics & Gynecology

## 2017-12-26 ENCOUNTER — Ambulatory Visit (INDEPENDENT_AMBULATORY_CARE_PROVIDER_SITE_OTHER): Payer: 59 | Admitting: Obstetrics & Gynecology

## 2017-12-26 ENCOUNTER — Encounter: Payer: Self-pay | Admitting: Obstetrics & Gynecology

## 2017-12-26 VITALS — BP 140/84 | HR 100 | Resp 20 | Ht 65.0 in | Wt 298.0 lb

## 2017-12-26 DIAGNOSIS — Z1212 Encounter for screening for malignant neoplasm of rectum: Secondary | ICD-10-CM | POA: Diagnosis not present

## 2017-12-26 DIAGNOSIS — F339 Major depressive disorder, recurrent, unspecified: Secondary | ICD-10-CM

## 2017-12-26 DIAGNOSIS — Z01419 Encounter for gynecological examination (general) (routine) without abnormal findings: Secondary | ICD-10-CM | POA: Diagnosis not present

## 2017-12-26 DIAGNOSIS — Z1211 Encounter for screening for malignant neoplasm of colon: Secondary | ICD-10-CM

## 2017-12-26 DIAGNOSIS — F411 Generalized anxiety disorder: Secondary | ICD-10-CM

## 2017-12-26 LAB — HEMOCCULT GUIAC POC 1CARD (OFFICE): Fecal Occult Blood, POC: NEGATIVE

## 2017-12-26 MED ORDER — ESCITALOPRAM OXALATE 20 MG PO TABS: 20.0000 mg | ORAL_TABLET | Freq: Every day | ORAL | 11 refills | Status: DC

## 2017-12-26 MED ORDER — ALPRAZOLAM 0.5 MG PO TABS: 0.5000 mg | ORAL_TABLET | Freq: Three times a day (TID) | ORAL | 1 refills | Status: AC | PRN

## 2017-12-26 NOTE — Progress Notes (Signed)
Subjective:     Amy Molina is a 57 y.o. female here for a routine exam.  Patient's last menstrual period was 08/09/2016. G1P0010 Birth Control Method:  mmenopause Menstrual Calendar(currently): amenorrhiec  Current complaints: increased issues with depressiona dn anxiety symptoms.   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 Last Pap: 2016,  normal Last mammogram: 2018,  normal  Past Medical History:  Diagnosis Date  . Anxiety   . Arthritis    general stiffness of joints  . Asthma   . Diabetes mellitus ([place])   . Heart murmur    childhood years- no mention in adult yrs  . Hypertension   . Morbid obesity ([place])   . Seasonal allergies     Past Surgical History:  Procedure Laterality Date  . COLONOSCOPY N/A 08/04/2015   Procedure: COLONOSCOPY;  Surgeon: Jerene Bears, MD;  Location: [facility];  Service: Gastroenterology;  Laterality: N/A;  . OOPHORECTOMY  05-2000   REMOVED LEFT TUBE AND OVARY  . WISDOM TOOTH EXTRACTION      OB History    Gravida  1   Para      Term      Preterm      AB  1   Living  0     SAB  1   TAB      Ectopic      Multiple      Live Births              Social History   Socioeconomic History  . Marital status: Single    Spouse name: Not on file  . Number of children: Not on file  . Years of education: Not on file  . Highest education level: Not on file  Occupational History  . Not on file  Social Needs  . Financial resource strain: Not on file  . Food insecurity:    Worry: Not on file    Inability: Not on file  . Transportation needs:    Medical: Not on file    Non-medical: Not on file  Tobacco Use  . Smoking status: Never Smoker  . Smokeless tobacco: Never Used  Substance and Sexual Activity  . Alcohol use: Yes    Alcohol/week: 0.0 oz    Comment: RARELY  . Drug use: No  . Sexual activity: Not Currently   Birth control/protection: None    Comment: 1st intercourse 57 yo-More than 5 partners  Lifestyle  . Physical activity:    Days per week: Not on file    Minutes per session: Not on file  . Stress: Not on file  Relationships  . Social connections:    Talks on phone: Not on file    Gets together: Not on file    Attends religious service: Not on file    Active member of club or organization: Not on file    Attends meetings of clubs or organizations: Not on file    Relationship status: Not on file  Other Topics Concern  . Not on file  Social History Narrative  . Not on file    Family History  Problem Relation Age of Onset  . Heart disease Mother   . Hypertension Father   . Heart disease Father   . Colon polyps Father   . COPD Father   . Diabetes Sister   . Diabetes Maternal Aunt  7 mat aunts diabetes  . Hypertension Maternal Aunt   . Lung disease Maternal Aunt   . Hypertension Paternal Aunt   . Colon cancer Neg Hx      Current Outpatient Medications:  .  albuterol (PROVENTIL HFA;VENTOLIN HFA) 108 (90 Base) MCG/ACT inhaler, Inhale 1-2 puffs into the lungs every 6 (six) hours as needed for wheezing or shortness of breath., Disp: 1 Inhaler, Rfl: 0 .  aspirin 81 MG tablet, Take 81 mg by mouth at bedtime. , Disp: , Rfl:  .  Cyanocobalamin (VITAMIN B-12 CR PO), Take 1 tablet by mouth daily., Disp: , Rfl:  .  ergocalciferol (VITAMIN D2) 50000 units capsule, Take 1 tablet by mouth once a week., Disp: , Rfl:  .  loratadine (CLARITIN) 10 MG tablet, Take 10 mg by mouth daily., Disp: , Rfl:  .  losartan (COZAAR) 50 MG tablet, Take 1 tablet (50 mg total) by mouth daily. (Patient taking differently: Take 50 mg by mouth at bedtime. ), Disp: 90 tablet, Rfl: 3 .  metFORMIN (GLUCOPHAGE) 1000 MG tablet, Take 1 tablet (1,000 mg total) by mouth daily with breakfast., Disp: 90 tablet, Rfl: 3 .  Multiple Vitamin (MULTIVITAMIN WITH MINERALS) TABS tablet, Take 1 tablet by mouth every morning.,  Disp: , Rfl:  .  naproxen sodium (ANAPROX) 220 MG tablet, Take 440-660 mg by mouth 2 (two) times daily as needed (pain.). , Disp: , Rfl:  .  ondansetron (ZOFRAN ODT) 8 MG disintegrating tablet, Take 1 tablet (8 mg total) every 8 (eight) hours as needed by mouth for nausea or vomiting., Disp: 20 tablet, Rfl: 0 .  oxyCODONE-acetaminophen (PERCOCET/ROXICET) 5-325 MG tablet, Take 1-2 tablets by mouth every 4 (four) hours as needed for severe pain., Disp: 15 tablet, Rfl: 0 .  ranitidine (ZANTAC) 150 MG tablet, Take 150 mg by mouth at bedtime. , Disp: , Rfl:  .  SYMBICORT 160-4.5 MCG/ACT inhaler, , Disp: , Rfl:  .  ALPRAZolam (XANAX) 0.5 MG tablet, Take 1 tablet (0.5 mg total) by mouth 3 (three) times daily as needed for sleep or anxiety., Disp: 90 tablet, Rfl: 1 .  escitalopram (LEXAPRO) 20 MG tablet, Take 1 tablet (20 mg total) by mouth daily., Disp: 30 tablet, Rfl: 11 .  mometasone-formoterol (DULERA) 200-5 MCG/ACT AERO, Inhale 2 puffs 2 (two) times daily into the lungs. (Patient not taking: Reported on 12/26/2017), Disp: 1 Inhaler, Rfl: 1  Review of Systems  Review of Systems  Constitutional: Negative for fever, chills, weight loss, malaise/fatigue and diaphoresis.  HENT: Negative for hearing loss, ear pain, nosebleeds, congestion, sore throat, neck pain, tinnitus and ear discharge.   Eyes: Negative for blurred vision, double vision, photophobia, pain, discharge and redness.  Respiratory: Negative for cough, hemoptysis, sputum production, shortness of breath, wheezing and stridor.   Cardiovascular: Negative for chest pain, palpitations, orthopnea, claudication, leg swelling and PND.  Gastrointestinal: negative for abdominal pain. Negative for heartburn, nausea, vomiting, diarrhea, constipation, blood in stool and melena.  Genitourinary: Negative for dysuria, urgency, frequency, hematuria and flank pain.  Musculoskeletal: Negative for myalgias, back pain, joint pain and falls.  Skin: Negative for  itching and rash.  Neurological: Negative for dizziness, tingling, tremors, sensory change, speech change, focal weakness, seizures, loss of consciousness, weakness and headaches.  Endo/Heme/Allergies: Negative for environmental allergies and polydipsia. Does not bruise/bleed easily.  Psychiatric/Behavioral: Negative for depression, suicidal ideas, hallucinations, memory loss and substance abuse. The patient is not nervous/anxious and does not have insomnia.  Objective:  Blood pressure 140/84, pulse 100, resp. rate 20, height 5\' 5"  (1.651 m), weight 298 lb (135.2 kg), last menstrual period 08/09/2016.   Physical Exam  Vitals reviewed. Constitutional: She is oriented to person, place, and time. She appears well-developed and well-nourished.  HENT:  Head: Normocephalic and atraumatic.        Right Ear: External ear normal.  Left Ear: External ear normal.  Nose: Nose normal.  Mouth/Throat: Oropharynx is clear and moist.  Eyes: Conjunctivae and EOM are normal. Pupils are equal, round, and reactive to light. Right eye exhibits no discharge. Left eye exhibits no discharge. No scleral icterus.  Neck: Normal range of motion. Neck supple. No tracheal deviation present. No thyromegaly present.  Cardiovascular: Normal rate, regular rhythm, normal heart sounds and intact distal pulses.  Exam reveals no gallop and no friction rub.   No murmur heard. Respiratory: Effort normal and breath sounds normal. No respiratory distress. She has no wheezes. She has no rales. She exhibits no tenderness.  GI: Soft. Bowel sounds are normal. She exhibits no distension and no mass. There is no tenderness. There is no rebound and no guarding.  Genitourinary:  Breasts no masses skin changes or nipple changes bilaterally      Vulva is normal without lesions Vagina is pink moist without discharge Cervix normal in appearance and pap is done Uterus is normal size shape and contour Adnexa is negative with normal  sized ovaries   Musculoskeletal: Normal range of motion. She exhibits no edema and no tenderness.  Neurological: She is alert and oriented to person, place, and time. She has normal reflexes. She displays normal reflexes. No cranial nerve deficit. She exhibits normal muscle tone. Coordination normal.  Skin: Skin is warm and dry. No rash noted. No erythema. No pallor.  Psychiatric: She has a normal mood and affect. Her behavior is normal. Judgment and thought content normal.       Medications Ordered at today's visit: Meds ordered this encounter  Medications  . ALPRAZolam (XANAX) 0.5 MG tablet    Sig: Take 1 tablet (0.5 mg total) by mouth 3 (three) times daily as needed for sleep or anxiety.    Dispense:  90 tablet    Refill:  1  . escitalopram (LEXAPRO) 20 MG tablet    Sig: Take 1 tablet (20 mg total) by mouth daily.    Dispense:  30 tablet    Refill:  11    Other orders placed at today's visit: No orders of the defined types were placed in this encounter.     Assessment:    Healthy female exam.    Plan:    Contraception: none. Mammogram ordered. Follow up in: 1 month.     Return in about 1 month (around 01/25/2018) for Follow up, with Dr Elonda Husky.

## 2017-12-28 LAB — CYTOLOGY - PAP
Diagnosis: NEGATIVE
HPV: NOT DETECTED

## 2018-01-26 ENCOUNTER — Ambulatory Visit: Payer: 59 | Admitting: Obstetrics & Gynecology

## 2018-02-15 ENCOUNTER — Encounter: Payer: Self-pay | Admitting: Obstetrics & Gynecology

## 2018-02-15 ENCOUNTER — Ambulatory Visit: Payer: 59 | Admitting: Obstetrics & Gynecology

## 2018-02-15 VITALS — BP 158/82 | HR 88 | Ht 65.0 in | Wt 303.0 lb

## 2018-02-15 DIAGNOSIS — F411 Generalized anxiety disorder: Secondary | ICD-10-CM

## 2018-02-15 DIAGNOSIS — F339 Major depressive disorder, recurrent, unspecified: Secondary | ICD-10-CM

## 2018-02-15 DIAGNOSIS — F418 Other specified anxiety disorders: Secondary | ICD-10-CM

## 2018-02-15 NOTE — Progress Notes (Signed)
Subjective:     Amy Molina is a 57 y.o. female who presents for follow up of depression. Current symptoms include anhedonia, depressed mood, difficulty concentrating, fatigue, feelings of worthlessness/guilt, hopelessness, impaired memory, insomnia and suicidal thoughts without plan. Symptoms have been improving since that time. Patient denies suicidal thoughts without plan. Previous treatment includes: medication. She complains of the following side effects from the treatment: none.  The following portions of the patient's history were reviewed and updated as appropriate: allergies, current medications, past family history, past medical history, past social history, past surgical history and problem list.  Review of Systems Pertinent items are noted in HPI.    Objective:    BP (!) 158/82 (BP Location: Left Arm, Patient Position: Sitting, Cuff Size: Normal)   Pulse 88   Ht 5\' 5"  (1.651 m)   Wt (!) 303 lb (137.4 kg)   LMP 08/09/2016   BMI 50.42 kg/m   General:  alert, cooperative and no distress  Affect & Behavior:  full facial expressions, good grooming, good insight, normal perception, normal reasoning, normal speech pattern and content and normal thought patterns normal      Assessment:    Depression, improving      Plan:    Medications: Lexapro and Xanax. Follow up: 3 months. Spent 15 minutes (>50% of visit) discussing the risks of anxiety disorder and depression, the  pathophysiology, etiology, risks, and principles of treatment.

## 2018-02-23 ENCOUNTER — Encounter: Payer: Self-pay | Admitting: *Deleted

## 2018-03-12 ENCOUNTER — Ambulatory Visit: Payer: Self-pay | Admitting: Internal Medicine

## 2018-05-18 ENCOUNTER — Ambulatory Visit: Payer: 59 | Admitting: Obstetrics & Gynecology

## 2018-06-04 ENCOUNTER — Encounter: Payer: Self-pay | Admitting: Obstetrics & Gynecology

## 2018-06-04 ENCOUNTER — Other Ambulatory Visit: Payer: Self-pay

## 2018-06-04 ENCOUNTER — Ambulatory Visit (INDEPENDENT_AMBULATORY_CARE_PROVIDER_SITE_OTHER): Payer: 59 | Admitting: Obstetrics & Gynecology

## 2018-06-04 VITALS — BP 145/78 | HR 81 | Ht 65.0 in | Wt 317.0 lb

## 2018-06-04 DIAGNOSIS — F411 Generalized anxiety disorder: Secondary | ICD-10-CM

## 2018-06-04 DIAGNOSIS — F339 Major depressive disorder, recurrent, unspecified: Secondary | ICD-10-CM | POA: Diagnosis not present

## 2018-06-04 MED ORDER — ESCITALOPRAM OXALATE 20 MG PO TABS
20.0000 mg | ORAL_TABLET | Freq: Every day | ORAL | 11 refills | Status: DC
Start: 2018-06-04 — End: 2019-07-17

## 2018-06-04 NOTE — Progress Notes (Signed)
Subjective:     Amy Molina is a 57 y.o. female who presents for follow up of depression. Current symptoms include doing well resolution of her depression symptoms. Symptoms have been completely resolved since that time. Patient denies anhedonia, depressed mood, difficulty concentrating, fatigue, feelings of worthlessness/guilt, hopelessness, hypersomnia, impaired memory, insomnia, psychomotor agitation, psychomotor retardation, recurrent thoughts of death, suicidal attempt, suicidal thoughts with specific plan, suicidal thoughts without plan, weight gain and weight loss. Previous treatment includes: none. She complains of the following side effects from the treatment: none.  The following portions of the patient's history were reviewed and updated as appropriate: allergies, current medications, past family history, past medical history, past social history, past surgical history and problem list.  Review of Systems Pertinent items are noted in HPI.    Objective:    BP (!) 145/78 (BP Location: Left Arm, Patient Position: Sitting, Cuff Size: Large)   Pulse 81   Ht 5\' 5"  (1.651 m)   Wt (!) 317 lb (143.8 kg)   LMP 08/09/2016   BMI 52.75 kg/m   General:  alert, cooperative and no distress  Affect & Behavior:  full facial expressions, good grooming, good insight, normal perception, normal reasoning, normal speech pattern and content and normal thought patterns       Assessment:    Depression, controlled      Plan:    Medications: Lexapro and Xanax. Follow up: 3 weeks. Spent 15 minutes (>50% of visit) discussing the risks of anxiety disorder and depression, the  pathophysiology, etiology, risks, and principles of treatment.

## 2018-07-18 ENCOUNTER — Ambulatory Visit: Payer: Self-pay | Admitting: Nurse Practitioner

## 2018-07-18 ENCOUNTER — Ambulatory Visit: Payer: 59 | Admitting: Nurse Practitioner

## 2018-07-18 ENCOUNTER — Other Ambulatory Visit (INDEPENDENT_AMBULATORY_CARE_PROVIDER_SITE_OTHER): Payer: 59

## 2018-07-18 ENCOUNTER — Encounter (INDEPENDENT_AMBULATORY_CARE_PROVIDER_SITE_OTHER): Payer: Self-pay

## 2018-07-18 ENCOUNTER — Encounter: Payer: Self-pay | Admitting: Nurse Practitioner

## 2018-07-18 VITALS — BP 102/60 | HR 75 | Ht 65.0 in | Wt 302.0 lb

## 2018-07-18 DIAGNOSIS — K219 Gastro-esophageal reflux disease without esophagitis: Secondary | ICD-10-CM

## 2018-07-18 DIAGNOSIS — R109 Unspecified abdominal pain: Secondary | ICD-10-CM | POA: Diagnosis not present

## 2018-07-18 DIAGNOSIS — R197 Diarrhea, unspecified: Secondary | ICD-10-CM

## 2018-07-18 LAB — URINALYSIS, ROUTINE W REFLEX MICROSCOPIC
Ketones, ur: NEGATIVE
Nitrite: NEGATIVE
PH: 6 (ref 5.0–8.0)
SPECIFIC GRAVITY, URINE: 1.02 (ref 1.000–1.030)
URINE GLUCOSE: NEGATIVE
Urobilinogen, UA: 0.2 (ref 0.0–1.0)

## 2018-07-18 MED ORDER — DICYCLOMINE HCL 10 MG PO CAPS: 10.0000 mg | ORAL_CAPSULE | Freq: Two times a day (BID) | ORAL | 0 refills | Status: DC

## 2018-07-18 NOTE — Progress Notes (Addendum)
Chief Complaint:  Abdominal pain and diarrhea.     IMPRESSION and PLAN:    57 yo female with recurrent diffuse abdominal cramping and diarrhea.  Diarrhea is new but abdominal cramps started November 2018 when patient was admitted with a tubo-ovarian abscess.  Got better with antibiotics but had another "flare" of abdominal cramping in April 2019.  CT scan in ED at that time again showed same RLQ /right pelvic inflammatory changes. Retreated with antibiotics and got better though I don't she any repeat imaging.  She DID NOT have associated diarrhea with first episode of pain in Nov 2018 nor with second episode in April 2019.   -Recent WBC ok at 11.5.  -Patient recently prescribed antibiotics for cyst on her back but she took only 1 dose.  She was trying not to add any new medications until diarrhea could be sorted out.  Still need to check stool for C-diff. Also add lactoferrin.  -If stool studies negative then I wonder if she could have a recurrence the same inflammatory GYN process as before since pain feels the exact same, though that wouldn't explain the diarrhea.  If stool studies negative and pain persists then consider repeat CT scan of the abdomen and pelvis -While awaiting C. difficile studies I asked her to refrain from using the antidiarrheals she has on hand.  -Trial of Bentyl twice daily as needed -She had a small amount of blood on tissue after urinating today.  Will check a UA   GERD.Asymptomatic -She was recently symptomatic, Zantac was stopped in a new GERD medication prescribed.  Patient is not taking the medication right now.  She did not want to add a new medication until diarrhea could be sorted out.  She actually has no GERD symptoms right now  Addendum: Reviewed and agree with assessment and management plan. Agree with stool studies though if negative would have high suspicion for abnormality in the right ovary versus fallopian tube given inflammation seen here  previously If stool studies negative would repeat CT scan abdomen and pelvis and refer back to GYN If GYN evaluation unremarkable then would consider colonoscopy Pyrtle, Lajuan Lines, MD   HPI:     Patient is a 57 year old female with history of asthma, hypertension, anxiety, GERD . She is known to Dr. Hilarie Fredrickson from screening colonoscopy she had in 2016. Next colonoscopy due in 2026 she is referred by PCP Leonides Grills, Brooke Bonito., PA-C for abdominal pain / diarrhea.   Patient's GI problems date back to ~ one year ago she was admitted to the hospital with significant upper abdominal pain and fever > 101.  In the days prior she had been treated empirically for a UTI without resolution of the pain. CT scan showed enlarged right ovary with surrounding mesenteric thickening.  There was inflammation extending to the level of the proximal ascending colon concerning for tubo-ovarian abscess. Normal appendix. She was treated with antibiotics.  She has had follow up with GYN since and abscess deemed resolved.she had a second "flare" in April of this year.  At that time her white count was 19.5 CT scan showed similar findings in the RLQ and pelvis likely representing stigmata of PID and/or early tubulo-ovarian abscess.  Appendix was normal.  Per to ED records, case was discussed with gynecology who recommended patient be placed back on Cipro and Flagyl.  Symptoms resolved with antibiotics.  Ms. Kiger has returned with the same diffuse abdominal cramping but this time with diarrhea  x 2 weeks.  Abdominal pain feels like spasms . some days she was having up to 14 loose, non-bloody stools a day. She has been taking Imodium as well as Lomotil.  Yesterday despite antidiarrheal she still had 8 stools or they were a little more firmer.  No specific foods trigger the diarrhea.  Apparently someone recently prescribed her something other than Zantac for GERD but she did not start it.  Again she did not want to add any new medications  until diarrhea can be sorted out.  She reports a 15 pound weight loss in the last 2 weeks.  Per epic, she has lost 16 pounds since late September  Review of systems:     No chest pain, no SOB, no fevers, no urinary sx    Data reviewed: 07/03/2018 labs BUN 19, creatinine 0.68 Liver tests normal WBC 11.5, hemoglobin 14.3, MCV 92.7, platelets 229 Absolute eosinophils elevated at 562 (normal 15-500) Absolute lymphocytes 4117 (normal 850 - 3900) A1c  7.3 TSH 2.92  Past Medical History:  Diagnosis Date  . Anxiety   . Arthritis    general stiffness of joints  . Asthma   . Diabetes mellitus ([place])   . Diarrhea   . Heart murmur    childhood years- no mention in adult yrs  . Hypertension   . Morbid obesity ([place])   . Seasonal allergies   . Spinal stenosis     Patient's surgical history, family medical history, social history, medications and allergies were all reviewed in Epic   Creatinine clearance cannot be calculated (Patient's most recent lab result is older than the maximum 21 days allowed.)  Current Outpatient Medications  Medication Sig Dispense Refill  . albuterol (PROVENTIL HFA;VENTOLIN HFA) 108 (90 Base) MCG/ACT inhaler Inhale 1-2 puffs into the lungs every 6 (six) hours as needed for wheezing or shortness of breath. 1 Inhaler 0  . ALPRAZolam (XANAX) 0.5 MG tablet Take 1 tablet (0.5 mg total) by mouth 3 (three) times daily as needed for sleep or anxiety. 90 tablet 1  . aspirin 81 MG tablet Take 81 mg by mouth at bedtime.     . Cyanocobalamin (VITAMIN B-12 CR PO) Take 1 tablet by mouth daily.    . diphenoxylate-atropine (LOMOTIL) 2.5-0.025 MG tablet Take by mouth 4 (four) times daily as needed for diarrhea or loose stools.    . ergocalciferol (VITAMIN D2) 50000 units capsule Take 1 tablet by mouth once a week.    . escitalopram (LEXAPRO) 20 MG tablet Take 1 tablet (20 mg total) by mouth daily. 30 tablet 11  . loratadine (CLARITIN) 10 MG tablet Take 10 mg by mouth daily.    Marland Kitchen  losartan (COZAAR) 50 MG tablet Take 1 tablet (50 mg total) by mouth daily. (Patient taking differently: Take 50 mg by mouth at bedtime. ) 90 tablet 3  . metFORMIN (GLUCOPHAGE) 1000 MG tablet Take 1 tablet (1,000 mg total) by mouth daily with breakfast. 90 tablet 3  . mometasone-formoterol (DULERA) 200-5 MCG/ACT AERO Inhale 2 puffs 2 (two) times daily into the lungs. 1 Inhaler 1  . Multiple Vitamin (MULTIVITAMIN WITH MINERALS) TABS tablet Take 1 tablet by mouth every morning.    . naproxen sodium (ANAPROX) 220 MG tablet Take 440-660 mg by mouth 2 (two) times daily as needed (pain.).     Marland Kitchen SYMBICORT 160-4.5 MCG/ACT inhaler      No current facility-administered medications for this visit.     Physical Exam:     BP 102/60  Pulse 75   Ht 5\' 5"  (1.651 m)   Wt (!) 302 lb (137 kg)   LMP 08/09/2016   BMI 50.26 kg/m   GENERAL:  Pleasant female in NAD PSYCH: : Cooperative, normal affect EENT:  conjunctiva pink, mucous membranes moist, neck supple without masses CARDIAC:  RRR, no murmur heard, no peripheral edema PULM: Normal respiratory effort, lungs CTA bilaterally, no wheezing ABDOMEN:  Nondistended, soft. No significant tenderness.  No obvious masses. Normal bowel sounds SKIN:  turgor, no lesions seen Musculoskeletal:  Normal muscle tone, normal strength NEURO: Alert and oriented x 3, no focal neurologic deficits   Tye Savoy , NP 07/18/2018, 10:07 AM

## 2018-07-18 NOTE — Patient Instructions (Addendum)
If you are age 55 or older, your body mass index should be between 23-30. Your Body mass index is 50.26 kg/m. If this is out of the aforementioned range listed, please consider follow up with your Primary Care Provider.  If you are age 77 or younger, your body mass index should be between 19-25. Your Body mass index is 50.26 kg/m. If this is out of the aformentioned range listed, please consider follow up with your Primary Care Provider.   Your provider has requested that you go to the basement level for lab work before leaving today. Press "B" on the elevator. The lab is located at the first door on the left as you exit the elevator.  We have sent the following medications to your pharmacy for you to pick up at your convenience: Bentyl  Follow up with me on July 25, 2018 at 11:30 am.  Thank you for choosing me and [facility].   Tye Savoy, NP

## 2018-07-23 ENCOUNTER — Other Ambulatory Visit: Payer: 59

## 2018-07-23 DIAGNOSIS — R197 Diarrhea, unspecified: Secondary | ICD-10-CM

## 2018-07-23 DIAGNOSIS — R109 Unspecified abdominal pain: Secondary | ICD-10-CM

## 2018-07-23 LAB — C.DIFFICILE TOXIN: C. Difficile Toxin A: NOT DETECTED

## 2018-07-24 ENCOUNTER — Other Ambulatory Visit: Payer: Self-pay

## 2018-07-24 DIAGNOSIS — K52838 Other microscopic colitis: Secondary | ICD-10-CM

## 2018-07-24 DIAGNOSIS — R109 Unspecified abdominal pain: Secondary | ICD-10-CM

## 2018-07-24 DIAGNOSIS — R197 Diarrhea, unspecified: Secondary | ICD-10-CM

## 2018-07-24 LAB — FECAL LACTOFERRIN, QUANT
FECAL LACTOFERRIN: NEGATIVE
MICRO NUMBER:: 91355296
SPECIMEN QUALITY:: ADEQUATE

## 2018-07-24 LAB — TIQ-NTM

## 2018-07-25 ENCOUNTER — Ambulatory Visit: Payer: Self-pay | Admitting: Nurse Practitioner

## 2018-07-27 ENCOUNTER — Other Ambulatory Visit (INDEPENDENT_AMBULATORY_CARE_PROVIDER_SITE_OTHER): Payer: 59

## 2018-07-27 DIAGNOSIS — R109 Unspecified abdominal pain: Secondary | ICD-10-CM

## 2018-07-27 DIAGNOSIS — R197 Diarrhea, unspecified: Secondary | ICD-10-CM | POA: Diagnosis not present

## 2018-07-27 LAB — BASIC METABOLIC PANEL
BUN: 10 mg/dL (ref 6–23)
CALCIUM: 9.4 mg/dL (ref 8.4–10.5)
CO2: 30 mEq/L (ref 19–32)
CREATININE: 0.63 mg/dL (ref 0.40–1.20)
Chloride: 104 mEq/L (ref 96–112)
GFR: 126.2 mL/min (ref >60.00)
Glucose, Bld: 190 mg/dL — ABNORMAL HIGH (ref 70–99)
Potassium: 4.1 mEq/L (ref 3.5–5.1)
Sodium: 141 mEq/L (ref 135–145)

## 2018-08-02 ENCOUNTER — Ambulatory Visit (INDEPENDENT_AMBULATORY_CARE_PROVIDER_SITE_OTHER)
Admission: RE | Admit: 2018-08-02 | Discharge: 2018-08-02 | Disposition: A | Discharge status: Home / Self Care | Payer: 59 | Source: Ambulatory Visit | Attending: Nurse Practitioner | Admitting: Nurse Practitioner

## 2018-08-02 DIAGNOSIS — K52838 Other microscopic colitis: Secondary | ICD-10-CM

## 2018-08-02 DIAGNOSIS — R197 Diarrhea, unspecified: Secondary | ICD-10-CM

## 2018-08-02 MED ORDER — IOPAMIDOL (ISOVUE-300) INJECTION 61%
100.0000 mL | Freq: Once | INTRAVENOUS | Status: AC | PRN
  Administered 2018-08-02: 100 mL via INTRAVENOUS

## 2018-08-06 ENCOUNTER — Ambulatory Visit: Payer: 59 | Admitting: Nurse Practitioner

## 2018-08-06 ENCOUNTER — Encounter: Payer: Self-pay | Admitting: Nurse Practitioner

## 2018-08-06 ENCOUNTER — Telehealth: Payer: Self-pay | Admitting: Obstetrics & Gynecology

## 2018-08-06 VITALS — BP 148/82 | HR 84 | Ht 65.0 in | Wt 306.8 lb

## 2018-08-06 DIAGNOSIS — R1030 Lower abdominal pain, unspecified: Secondary | ICD-10-CM | POA: Diagnosis not present

## 2018-08-06 DIAGNOSIS — R9389 Abnormal findings on diagnostic imaging of other specified body structures: Secondary | ICD-10-CM

## 2018-08-06 NOTE — Telephone Encounter (Signed)
Patient called, stated that she saw Wallace Going, [place], [phone]) gastroenterologist today.  She had a CT on 07/30/18.  The PA would like for him to review her chart to determine if it may be more gastro or ob-gyn related.  [phone]

## 2018-08-06 NOTE — Progress Notes (Addendum)
Chief Complaint:  follow up on diarrhea and abdominal pain  IMPRESSION and PLAN:     57 year old female with episodic lower abdominal pain for 1 year.  CT scan in November 2018, as well as April 2019 suggested inflammatory changes in the RLQ and pelvis possibly PID and/or early tubo-ovarian abscess.  Of note appendix appeared normal. Multiseptated complex cyst of the right ovary.  There was also some thickening of the adjacent sigmoid colon.  Responded to antibiotics both times but pain recurred several weeks ago, this time associated with diarrhea.  -Because the antibiotics I checked stool for C-diff, it was negative. She continued to have lower abdominal pain, especially LLQ so arranged for CTAP with contrast which suggests mild concentric wall thickening involving the sigmoid colon, small to moderate amount of free peritoneal fluid with mild progression.  A 9.2 cm uterine fibroid.  A 3.1 cm cystic component or adjacent free peritoneal fluid lateral to the right ovary.  -Due to ongoing lower abdominal discomfort (mainly just pressure sensation now) and sigmoid colon findings on CT scan, patient will be scheduled for colonoscopy.  I did ask her to please touch base with her gynecologist and have him review the CT scan findings for any gynecologic concerns. The risks and benefits of colonoscopy with possible polypectomy were discussed and the patient agrees to proceed.  -The wall thickening of sigmoid colon is mild, may or may not represent diverticulitis.  I am not giving her more antibiotics as she is already had 2 rounds of antibiotics for the same symptoms -No longer having flagrant diarrhea but she is still having frequent semi-formed stools throughout the day.  Further evaluation at time of colonoscopy   Addendum: Reviewed and agree with assessment and management plan. Agree with planned colonoscopy and if unremarkable/unrevealing then gyn intervention. Pyrtle, Lajuan Lines, MD    HPI:       Patient is a 57 year old female who I saw 07/18/18 for diffuse abdominal cramping and diarrhea.  The diarrhea was new but she had had episodes of the abdominal pain in the past.  With these episodes of abdominal pain patient had been seen in the ED. CT scan on 2 occasions showed a RLQ/right pelvic inflammatory process.  Patient was treated with around a biotics and retreated the second time she presented to the ED which was in April of this year.  After that second round of antibiotics she did relatively well until several weeks ago when pain recurred and it was associated with diarrhea (new).  Because of the rounds of antibiotics I wanted to rule out C. difficile which did turn out negative.   Her pain persisted so I repeated CTAP with contrast:.  IMPRESSION: 1. Mild concentric wall thickening involving the sigmoid colon as it passes between the right ovary and uterus with mild adjacent soft tissue stranding with improvement. This could be due to focal colitis or diverticulitis. This could also be due to improving adjacent pelvic inflammatory disease. 2. Small to moderate amount of free peritoneal fluid in the pelvis with mild progression. 3. 3.1 cm cystic component or adjacent free peritoneal fluid lateral to the right ovary. 4. 9.2 cm uterine fibroid. 5. Small to moderate-sized umbilical and supraumbilical hernias containing fat.  Patient is here for follow-up and to discuss the CT scan.  No longer having diarrhea.  Stools are partly formed and less frequent (5-6 times a day).  No blood in stools.  Dicyclomine slowed bowels down  and  did help with a lot of the abdominal discomfort but she is still having pressure sensation in her lower abdomen.  No dysuria.  No fevers.   Last colonoscopy exactly 3 years ago.  Exam was complete with a good prep. ENDOSCOPIC IMPRESSION: 1. Sessile polyp was found in the rectum; polypectomy was performed with a cold snare 2. There was mild diverticulosis  noted in the left colon Path:  "polyp" was rectal prolapse   Review of systems:     No chest pain, no SOB, no fevers, no urinary sx   Past Medical History:  Diagnosis Date  . Anxiety   . Arthritis    general stiffness of joints  . Asthma   . Diabetes mellitus ([place])   . Diarrhea   . Heart murmur    childhood years- no mention in adult yrs  . Hypertension   . Morbid obesity ([place])   . Seasonal allergies   . Spinal stenosis     Patient's surgical history, family medical history, social history, medications and allergies were all reviewed in Epic   Serum creatinine: 0.63 mg/dL 07/27/18 1502 Estimated creatinine clearance: 114.1 mL/min  Current Outpatient Medications  Medication Sig Dispense Refill  . albuterol (PROVENTIL HFA;VENTOLIN HFA) 108 (90 Base) MCG/ACT inhaler Inhale 1-2 puffs into the lungs every 6 (six) hours as needed for wheezing or shortness of breath. 1 Inhaler 0  . ALPRAZolam (XANAX) 0.5 MG tablet Take 1 tablet (0.5 mg total) by mouth 3 (three) times daily as needed for sleep or anxiety. 90 tablet 1  . aspirin 81 MG tablet Take 81 mg by mouth at bedtime.     . Cyanocobalamin (VITAMIN B-12 CR PO) Take 1 tablet by mouth daily.    Marland Kitchen dicyclomine (BENTYL) 10 MG capsule Take 1 capsule (10 mg total) by mouth 2 (two) times daily. 60 capsule 0  . ergocalciferol (VITAMIN D2) 50000 units capsule Take 1 tablet by mouth once a week.    . escitalopram (LEXAPRO) 20 MG tablet Take 1 tablet (20 mg total) by mouth daily. 30 tablet 11  . loratadine (CLARITIN) 10 MG tablet Take 10 mg by mouth daily.    Marland Kitchen losartan (COZAAR) 50 MG tablet Take 1 tablet (50 mg total) by mouth daily. (Patient taking differently: Take 50 mg by mouth at bedtime. ) 90 tablet 3  . metFORMIN (GLUCOPHAGE) 1000 MG tablet Take 1 tablet (1,000 mg total) by mouth daily with breakfast. 90 tablet 3  . mometasone-formoterol (DULERA) 200-5 MCG/ACT AERO Inhale 2 puffs 2 (two) times daily into the lungs. 1 Inhaler 1  .  Multiple Vitamin (MULTIVITAMIN WITH MINERALS) TABS tablet Take 1 tablet by mouth every morning.    . naproxen sodium (ANAPROX) 220 MG tablet Take 440-660 mg by mouth 2 (two) times daily as needed (pain.).     Marland Kitchen SYMBICORT 160-4.5 MCG/ACT inhaler      No current facility-administered medications for this visit.     Physical Exam:     BP (!) 148/82   Pulse 84   Ht 5\' 5"  (1.651 m)   Wt (!) 306 lb 12.8 oz (139.2 kg)   LMP 08/09/2016   BMI 51.05 kg/m   GENERAL:  Pleasant female in NAD PSYCH: : Cooperative, normal affect EENT:  conjunctiva pink, mucous membranes moist, neck supple without masses CARDIAC:  RRR,  no peripheral edema PULM: Normal respiratory effort, lungs CTA bilaterally, no wheezing ABDOMEN:  Nondistended, soft, mild-moderate RLQ tenderness.  Normal bowel sounds SKIN:  turgor, no  lesions seen Musculoskeletal:  Normal muscle tone, normal strength NEURO: Alert and oriented x 3, no focal neurologic deficits   Tye Savoy , NP 08/06/2018, 11:34 AM

## 2018-08-06 NOTE — Telephone Encounter (Signed)
She has a stable fibroid and an unremarkable clinically insignificant right ovarian cyst  Her symtpoms would seem to correlate with the findings related to her colon

## 2018-08-06 NOTE — Telephone Encounter (Signed)
Spoke with patient regarding colonoscopy appointment and previsit appointment. Patient verbalized understanding.

## 2018-08-06 NOTE — Patient Instructions (Signed)
If you are age 55 or older, your body mass index should be between 23-30. Your Body mass index is 51.05 kg/m. If this is out of the aforementioned range listed, please consider follow up with your Primary Care Provider.  If you are age 71 or younger, your body mass index should be between 19-25. Your Body mass index is 51.05 kg/m. If this is out of the aformentioned range listed, please consider follow up with your Primary Care Provider.   We will contact you regarding Colonoscopy at [facility] with Dr. Hilarie Fredrickson when the schedule is available.  Please call your GYN about CT scan findings.  Thank you for choosing me and [facility].   Tye Savoy, NP

## 2018-08-07 NOTE — Telephone Encounter (Signed)
Mail box full. Unable to leave message. Encounter closed. [place]

## 2018-08-08 ENCOUNTER — Other Ambulatory Visit: Payer: Self-pay

## 2018-08-10 ENCOUNTER — Encounter: Payer: Self-pay | Admitting: Nurse Practitioner

## 2018-08-21 ENCOUNTER — Telehealth: Payer: Self-pay | Admitting: Nurse Practitioner

## 2018-08-21 ENCOUNTER — Other Ambulatory Visit: Payer: Self-pay

## 2018-08-21 NOTE — Telephone Encounter (Signed)
Tried to call the patient. No answer. No voicemail.

## 2018-08-21 NOTE — Telephone Encounter (Signed)
Message sent to the patient. 

## 2018-08-21 NOTE — Telephone Encounter (Signed)
Amy Molina the patient needs a blanket statement covering 07/17/18 until 08/27/18. It will need to state out of work for medical reasons and under our care.  Okay to do?

## 2018-08-21 NOTE — Telephone Encounter (Signed)
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 I still don't think we can cover her for over a month however. Regarding dicyclomine, if it helps but dries her out, she can continue 1 a day but stay hydrated.  If it is of no benefit then stop the medication.  Also, please thank her for calling the GYN regarding CT scan results as I asked her to do.  I saw GYNs comments and he has no major GYN concerns at this time. Thanks

## 2018-08-21 NOTE — Telephone Encounter (Signed)
Spoke with Amy Molina again. She has a disability plan through her work. The Bentyl helps but to take 2 a day makes her sleepy and "dried out." Taking one Bentyl helps but she can still "have a day." To return to work and then miss again would have been against her benefits. She was advised not to come back until she felt better. She wants to go back but has to have a note saying she can.

## 2018-08-21 NOTE — Telephone Encounter (Signed)
Beth, that is being out for over a month.  I do not know that we can justify this.  We have not found a reason for her pain.  She has had some GYN issues going on.  We can cover her for when she was seen in the office and for the days that she had testing done but I cannot cover her for every day out of work for that time..  I will forward to Dr. Hilarie Fredrickson in case he feels differently. Thanks

## 2018-09-07 ENCOUNTER — Encounter (HOSPITAL_COMMUNITY): Payer: Self-pay

## 2018-09-07 ENCOUNTER — Ambulatory Visit (AMBULATORY_SURGERY_CENTER): Payer: Self-pay

## 2018-09-07 VITALS — Ht 65.0 in | Wt 314.0 lb

## 2018-09-07 DIAGNOSIS — R1030 Lower abdominal pain, unspecified: Secondary | ICD-10-CM

## 2018-09-07 DIAGNOSIS — R935 Abnormal findings on diagnostic imaging of other abdominal regions, including retroperitoneum: Secondary | ICD-10-CM

## 2018-09-07 MED ORDER — NA SULFATE-K SULFATE-MG SULF 17.5-3.13-1.6 GM/177ML PO SOLN: 1.0000 | Freq: Once | ORAL | 0 refills | Status: AC

## 2018-09-07 NOTE — Progress Notes (Signed)
Per pt, no allergies to soy or egg products.Pt not taking any weight loss meds or using  O2 at home.  Pt refused emmi video. 

## 2018-09-10 ENCOUNTER — Ambulatory Visit: Payer: 59 | Admitting: Obstetrics & Gynecology

## 2018-09-10 ENCOUNTER — Encounter: Payer: Self-pay | Admitting: Obstetrics & Gynecology

## 2018-09-10 VITALS — BP 153/83 | HR 86 | Ht 65.0 in | Wt 312.0 lb

## 2018-09-10 DIAGNOSIS — F339 Major depressive disorder, recurrent, unspecified: Secondary | ICD-10-CM

## 2018-09-10 NOTE — Progress Notes (Signed)
Subjective:     Amy Molina is a 57 y.o. female who presents for follow up of depression. Current symptoms include resolved epression. Symptoms have been controlled since that time. Patient denies anhedonia, depressed mood, difficulty concentrating, fatigue, feelings of worthlessness/guilt, hopelessness, hypersomnia, impaired memory, insomnia, psychomotor agitation, psychomotor retardation, recurrent thoughts of death, suicidal attempt, suicidal thoughts with specific plan, suicidal thoughts without plan, weight gain and weight loss. Previous treatment includes: none. She complains of the following side effects from the treatment: none.  The following portions of the patient's history were reviewed and updated as appropriate: allergies, current medications, past family history, past medical history, past social history, past surgical history and problem list.  Review of Systems Pertinent items are noted in HPI.    Objective:    BP (!) 153/83 (BP Location: Left Arm, Patient Position: Sitting, Cuff Size: Large)   Pulse 86   Ht 5\' 5"  (1.651 m)   Wt (!) 312 lb (141.5 kg)   LMP 08/09/2016   BMI 51.92 kg/m   General:  alert, cooperative and no distress  Affect & Behavior:  full facial expressions, good grooming, good insight, normal perception, normal reasoning, normal speech pattern and content and normal thought patterns       Assessment:    Depression, controlled      Plan:    Medications: Lexapro and Xanax. Instructed patient to contact office or on-call physician promptly should condition worsen or any new symptoms appear and provided on-call telephone numbers. IF THE PATIENT HAS ANY SUICIDAL OR HOMICIDAL IDEATIONS, CALL THE OFFICE, DISCUSS WITH A SUPPORT MEMBER, OR GO TO THE ER IMMEDIATELY. Patient was agreeable with this plan. Follow up: 3 months. Spent 15 minutes (>50% of visit) discussing the risks of depression, the  pathophysiology, etiology, risks, and principles of treatment.

## 2018-09-14 ENCOUNTER — Encounter (HOSPITAL_COMMUNITY): Payer: Self-pay | Admitting: Certified Registered Nurse Anesthetist

## 2018-09-14 ENCOUNTER — Telehealth: Payer: Self-pay | Admitting: Internal Medicine

## 2018-09-14 ENCOUNTER — Other Ambulatory Visit: Payer: Self-pay

## 2018-09-14 DIAGNOSIS — Z1211 Encounter for screening for malignant neoplasm of colon: Secondary | ICD-10-CM

## 2018-09-14 NOTE — Telephone Encounter (Signed)
Pt says she has the flu and needs a CB to resched upcoming colon at [facility] 09/17/18.

## 2018-09-14 NOTE — Telephone Encounter (Signed)
Pts colon cancelled as she states she has the flu. Will call pt when next hospital appt available to reschedule procedure.

## 2018-09-17 ENCOUNTER — Encounter (HOSPITAL_COMMUNITY): Admission: RE | Payer: Self-pay | Source: Home / Self Care

## 2018-09-17 ENCOUNTER — Ambulatory Visit (HOSPITAL_COMMUNITY): Admission: RE | Admit: 2018-09-17 | Payer: 59 | Source: Home / Self Care | Admitting: Internal Medicine

## 2018-09-17 SURGERY — COLONOSCOPY WITH PROPOFOL
Anesthesia: Monitor Anesthesia Care

## 2018-10-22 ENCOUNTER — Encounter (HOSPITAL_COMMUNITY): Payer: Self-pay | Admitting: *Deleted

## 2018-10-22 ENCOUNTER — Other Ambulatory Visit: Payer: Self-pay

## 2018-10-22 ENCOUNTER — Ambulatory Visit (HOSPITAL_COMMUNITY)
Admission: RE | Admit: 2018-10-22 | Discharge: 2018-10-22 | Disposition: A | Discharge status: Home / Self Care | Payer: 59 | Attending: Internal Medicine | Admitting: Internal Medicine

## 2018-10-22 ENCOUNTER — Ambulatory Visit (HOSPITAL_COMMUNITY): Payer: 59 | Admitting: Anesthesiology

## 2018-10-22 ENCOUNTER — Encounter (HOSPITAL_COMMUNITY)
Admission: RE | Disposition: A | Discharge status: Home / Self Care | Payer: Self-pay | Source: Home / Self Care | Attending: Internal Medicine

## 2018-10-22 DIAGNOSIS — Z882 Allergy status to sulfonamides status: Secondary | ICD-10-CM | POA: Diagnosis not present

## 2018-10-22 DIAGNOSIS — Z79899 Other long term (current) drug therapy: Secondary | ICD-10-CM | POA: Insufficient documentation

## 2018-10-22 DIAGNOSIS — R1030 Lower abdominal pain, unspecified: Secondary | ICD-10-CM | POA: Diagnosis not present

## 2018-10-22 DIAGNOSIS — D122 Benign neoplasm of ascending colon: Secondary | ICD-10-CM | POA: Diagnosis not present

## 2018-10-22 DIAGNOSIS — Z7984 Long term (current) use of oral hypoglycemic drugs: Secondary | ICD-10-CM | POA: Insufficient documentation

## 2018-10-22 DIAGNOSIS — I10 Essential (primary) hypertension: Secondary | ICD-10-CM | POA: Insufficient documentation

## 2018-10-22 DIAGNOSIS — Z880 Allergy status to penicillin: Secondary | ICD-10-CM | POA: Diagnosis not present

## 2018-10-22 DIAGNOSIS — J45909 Unspecified asthma, uncomplicated: Secondary | ICD-10-CM | POA: Diagnosis not present

## 2018-10-22 DIAGNOSIS — F419 Anxiety disorder, unspecified: Secondary | ICD-10-CM | POA: Diagnosis not present

## 2018-10-22 DIAGNOSIS — Z7951 Long term (current) use of inhaled steroids: Secondary | ICD-10-CM | POA: Diagnosis not present

## 2018-10-22 DIAGNOSIS — Z888 Allergy status to other drugs, medicaments and biological substances status: Secondary | ICD-10-CM | POA: Insufficient documentation

## 2018-10-22 DIAGNOSIS — Z6841 Body Mass Index (BMI) 40.0 and over, adult: Secondary | ICD-10-CM | POA: Insufficient documentation

## 2018-10-22 DIAGNOSIS — K635 Polyp of colon: Secondary | ICD-10-CM | POA: Diagnosis not present

## 2018-10-22 DIAGNOSIS — R933 Abnormal findings on diagnostic imaging of other parts of digestive tract: Secondary | ICD-10-CM | POA: Diagnosis present

## 2018-10-22 DIAGNOSIS — Z1211 Encounter for screening for malignant neoplasm of colon: Secondary | ICD-10-CM

## 2018-10-22 DIAGNOSIS — E119 Type 2 diabetes mellitus without complications: Secondary | ICD-10-CM | POA: Diagnosis not present

## 2018-10-22 DIAGNOSIS — Z881 Allergy status to other antibiotic agents status: Secondary | ICD-10-CM | POA: Insufficient documentation

## 2018-10-22 DIAGNOSIS — K5730 Diverticulosis of large intestine without perforation or abscess without bleeding: Secondary | ICD-10-CM | POA: Diagnosis not present

## 2018-10-22 HISTORY — PX: COLONOSCOPY WITH PROPOFOL: SHX5780

## 2018-10-22 HISTORY — PX: POLYPECTOMY: SHX5525

## 2018-10-22 LAB — GLUCOSE, CAPILLARY: GLUCOSE-CAPILLARY: 126 mg/dL — AB (ref 70–99)

## 2018-10-22 SURGERY — COLONOSCOPY WITH PROPOFOL
Anesthesia: Monitor Anesthesia Care

## 2018-10-22 MED ORDER — SODIUM CHLORIDE 0.9 % IV SOLN: INTRAVENOUS | Status: DC

## 2018-10-22 MED ORDER — LIDOCAINE HCL 1 % IJ SOLN
INTRAMUSCULAR | Status: DC | PRN
  Administered 2018-10-22: 30 mg via INTRADERMAL

## 2018-10-22 MED ORDER — PROPOFOL 10 MG/ML IV BOLUS
INTRAVENOUS | Status: DC | PRN
  Administered 2018-10-22 (×5): 10 mg via INTRAVENOUS
  Administered 2018-10-22: 20 mg via INTRAVENOUS
  Administered 2018-10-22 (×2): 10 mg via INTRAVENOUS
  Administered 2018-10-22 (×2): 50 mg via INTRAVENOUS
  Administered 2018-10-22 (×2): 20 mg via INTRAVENOUS

## 2018-10-22 MED ORDER — LACTATED RINGERS IV SOLN
INTRAVENOUS | Status: DC
  Administered 2018-10-22: 1000 mL via INTRAVENOUS

## 2018-10-22 MED ORDER — PROPOFOL 10 MG/ML IV BOLUS
INTRAVENOUS | Status: AC
  Filled 2018-10-22: qty 40

## 2018-10-22 SURGICAL SUPPLY — 21 items

## 2018-10-22 NOTE — Anesthesia Postprocedure Evaluation (Signed)
Anesthesia Post Note  Patient: Amy Molina  Procedure(s) Performed: COLONOSCOPY WITH PROPOFOL (N/A ) POLYPECTOMY     Patient location during evaluation: PACU Anesthesia Type: MAC Level of consciousness: awake and alert and oriented Pain management: pain level controlled Vital Signs Assessment: post-procedure vital signs reviewed and stable Respiratory status: spontaneous breathing, nonlabored ventilation and respiratory function stable Cardiovascular status: stable and blood pressure returned to baseline Postop Assessment: no apparent nausea or vomiting Anesthetic complications: no    Last Vitals:  Vitals:   10/22/18 0947 10/22/18 1052  BP: (!) 157/82 (!) 147/79  Pulse: 76 79  Resp: 19 18  Temp: 36.4 C 36.5 C  SpO2: 95% 100%    Last Pain:  Vitals:   10/22/18 1052  TempSrc: Oral  PainSc: 0-No pain                 Radek Carnero A.

## 2018-10-22 NOTE — H&P (Signed)
HPI: Amy Molina is a 58 year old female with a past medical history of hypertension, obesity, diabetes who is seen in November 2019 by Amy Savoy, NP for diffuse abdominal cramping pain and diarrhea.  She also had an abnormal CT scan of the colon.  She presents for outpatient colonoscopy.  CT scan from late 2019 showed a mild concentric wall thickening in the sigmoid as it passes between the right ovary and uterus with adjacent soft tissue stranding.  This was overall improved over 2 comparative images.  There was a question whether this could be due to an ovarian process or related to a fibroid uterus.  It also raised the question of focal colitis or even diverticulitis.  On follow-up with Amy Molina in late November she was no longer having diarrhea.  Stools had improved but she was having a pressure sensation in her lower abdomen.]  She had previously had colonoscopy in November 2016 which was rather unremarkable.  There was mild diverticulosis in the left colon and a rectal prolapse polyp which was removed.  No adenomatous polyps removed.  Today she reports that her bowel habits have returned to normal for her.  She is eating a relatively low fiber diet and avoiding foods with seeds and nuts.  She will occasionally use a dicyclomine if she has lower abdominal pressure or cramping and this helps her significantly.  She has not needed this in almost a month.  Tolerated the bowel prep well.  No abdominal pain today.  Past Medical History:  Diagnosis Date  . Anxiety   . Arthritis    general stiffness of joints  . Asthma   . Complication of anesthesia    per pt,"hard to wake up past some sedation!  . Diabetes mellitus ([place])   . Diarrhea   . Heart murmur    childhood years- no mention in adult yrs  . History of indigestion   . Hypertension   . Morbid obesity ([place])   . Seasonal allergies   . Spinal stenosis     Past Surgical History:  Procedure Laterality Date  . COLONOSCOPY N/A  08/04/2015   Procedure: COLONOSCOPY;  Surgeon: Jerene Bears, MD;  Location: [facility] ENDOSCOPY;  Service: Gastroenterology;  Laterality: N/A;  . COLONOSCOPY    . OOPHORECTOMY  05-2000   REMOVED LEFT TUBE AND OVARY  . WISDOM TOOTH EXTRACTION      (Not in an outpatient encounter)   Allergies  Allergen Reactions  . Penicillins Anaphylaxis, Shortness Of Breath and Swelling    DID THE REACTION INVOLVE: Swelling of the face/tongue/throat, SOB, or low BP? Yes Sudden or severe rash/hives, skin peeling, or the inside of the mouth or nose? No Did it require medical treatment? Yes When did it last happen?15+ years If all above answers are "NO", may proceed with cephalosporin use.  PATIENT TOLERATED ZOSYN AND MERREM ON 07/21/17 ADMISSION   . Floxin [Ofloxacin]     MIGRAINES, INSOMNIA, PARANOIND, RINGING IN EARS  . Sulfa Antibiotics Hives  . Lipitor [Atorvastatin]     Severe myalgias    Family History  Problem Relation Age of Onset  . Heart disease Mother   . Heart attack Mother   . Hypertension Father   . Heart disease Father   . Colon polyps Father   . COPD Father   . Diabetes Sister   . Diabetes Maternal Aunt        7 mat aunts diabetes  . Hypertension Maternal Aunt   . Lung disease Maternal Aunt   .  Hypertension Paternal Aunt   . Breast cancer Paternal Aunt   . Colon cancer Neg Hx     Social History   Tobacco Use  . Smoking status: Never Smoker  . Smokeless tobacco: Never Used  Substance Use Topics  . Alcohol use: Yes    Alcohol/week: 0.0 standard drinks    Comment: RARELY  . Drug use: No    ROS: As per history of present illness, otherwise negative  BP (!) 157/82   Pulse 76   Temp 97.6 F (36.4 C) (Oral)   Resp 19   Ht 5\' 5"  (1.651 m)   Wt (!) 140.6 kg   LMP 08/09/2016   SpO2 95%   BMI 51.59 kg/m  Gen: awake, alert, NAD HEENT: anicteric, op clear CV: RRR, no mrg Pulm: CTA b/l Abd: soft, NT/ND, +BS throughout Ext: no c/c/e Neuro:  nonfocal   ASSESSMENT/PLAN:  58 year old female with a past medical history of hypertension, obesity, diabetes who for outpatient screening colonoscopy to evaluate abnormal left colon by imaging but also with lower abdominal pain.  1.  Abnormal CT scan of the sigmoid/lower abdominal pressure --symptoms have improved.  Cannot exclude that she had diverticulitis versus inflammation of the left ovary/fallopian tube given its close proximity to sigmoid.  Symptoms have improved.  Colonoscopy today to exclude ongoing colitis or sigmoid pathology.  The nature of the procedure, as well as the risks, benefits, and alternatives were carefully and thoroughly reviewed with the patient. Ample time for discussion and questions allowed. The patient understood, was satisfied, and agreed to proceed.

## 2018-10-22 NOTE — Discharge Instructions (Addendum)

## 2018-10-22 NOTE — Anesthesia Preprocedure Evaluation (Signed)
Anesthesia Evaluation  Patient identified by MRN, date of birth, ID band Patient awake    Reviewed: Allergy & Precautions, NPO status , Patient's Chart, lab work & pertinent test results  History of Anesthesia Complications (+) PROLONGED EMERGENCE and history of anesthetic complications  Airway Mallampati: III  TM Distance: >3 FB Neck ROM: Full    Dental no notable dental hx. (+) Teeth Intact, Dental Advisory Given   Pulmonary shortness of breath and with exertion, asthma ,    Pulmonary exam normal breath sounds clear to auscultation       Cardiovascular hypertension, Pt. on medications (-) angina(-) CAD and (-) Past MI Normal cardiovascular exam+ Valvular Problems/Murmurs  Rhythm:Regular Rate:Normal     Neuro/Psych PSYCHIATRIC DISORDERS Anxiety negative neurological ROS     GI/Hepatic Neg liver ROS, GERD  Medicated,Screening for Colon Ca   Endo/Other  diabetes, Well Controlled, Type 2, Oral Hypoglycemic AgentsMorbid obesity  Renal/GU negative Renal ROS     Musculoskeletal  (+) Arthritis , Osteoarthritis,    Abdominal (+) + obese,   Peds  Hematology negative hematology ROS (+)   Anesthesia Other Findings   Reproductive/Obstetrics                             Anesthesia Physical  Anesthesia Plan  ASA: III  Anesthesia Plan: MAC   Post-op Pain Management:    Induction: Intravenous  PONV Risk Score and Plan: 2 and Ondansetron, Propofol infusion and Treatment may vary due to age or medical condition  Airway Management Planned: Nasal Cannula and Natural Airway  Additional Equipment:   Intra-op Plan:   Post-operative Plan:   Informed Consent: I have reviewed the patients History and Physical, chart, labs and discussed the procedure including the risks, benefits and alternatives for the proposed anesthesia with the patient or authorized representative who has indicated his/her  understanding and acceptance.     Dental advisory given  Plan Discussed with: CRNA and Anesthesiologist  Anesthesia Plan Comments: (Discussed risks/benefits/alternatives to MAC sedation including need for ventilatory support, hypotension, need for conversion to general anesthesia.  All patient questions answered.  Patient wished to proceed.)        Anesthesia Quick Evaluation

## 2018-10-22 NOTE — Transfer of Care (Signed)
Immediate Anesthesia Transfer of Care Note  Patient: Amy Molina  Procedure(s) Performed: COLONOSCOPY WITH PROPOFOL (N/A ) POLYPECTOMY  Patient Location: Endoscopy Unit  Anesthesia Type:MAC  Level of Consciousness: awake, alert  and oriented  Airway & Oxygen Therapy: Patient Spontanous Breathing and Patient connected to face mask oxygen  Post-op Assessment: Report given to RN and Post -op Vital signs reviewed and stable  Post vital signs: Reviewed and stable  Last Vitals:  Vitals Value Taken Time  BP 147/79 10/22/2018 10:52 AM  Temp 36.5 C 10/22/2018 10:52 AM  Pulse 75 10/22/2018 10:54 AM  Resp 20 10/22/2018 10:54 AM  SpO2 100 % 10/22/2018 10:54 AM  Vitals shown include unvalidated device data.  Last Pain:  Vitals:   10/22/18 1052  TempSrc: Oral  PainSc: 0-No pain         Complications: No apparent anesthesia complications

## 2018-10-22 NOTE — Op Note (Signed)
[facility] Patient Name: Amy Molina Procedure Date: 10/22/2018 MRN: [id number] Attending MD: Jerene Bears , MD Date of Birth: November 06, 1960 CSN: [id number] Age: 58 Admit Type: Outpatient Procedure:                Colonoscopy Indications:              Abnormal CT of the GI tract/sigmoid colon                            suggesting focal colitis, now resolved lower abd                            pain and diarrhea, normal colonoscopy 2016 Providers:                Lajuan Lines. Hilarie Fredrickson, MD, Zenon Mayo, RN, Cherylynn Ridges,                            Technician, Karis Juba, CRNA Referring MD:             Luane School. [place] Medicines:                Monitored Anesthesia Care Complications:            No immediate complications. Estimated Blood Loss:     Estimated blood loss was minimal. Procedure:                Pre-Anesthesia Assessment:                           - Prior to the procedure, a History and Physical                            was performed, and patient medications and                            allergies were reviewed. The patient's tolerance of                            previous anesthesia was also reviewed. The risks                            and benefits of the procedure and the sedation                            options and risks were discussed with the patient.                            All questions were answered, and informed consent                            was obtained. Prior Anticoagulants: The patient has                            taken no previous anticoagulant or antiplatelet  agents. ASA Grade Assessment: III - A patient with                            severe systemic disease. After reviewing the risks                            and benefits, the patient was deemed in                            satisfactory condition to undergo the procedure.                           After obtaining informed consent, the  colonoscope                            was passed under direct vision. Throughout the                            procedure, the patient's blood pressure, pulse, and                            oxygen saturations were monitored continuously. The                            PCF-H190DL ([id number] Olympus pediatric colonoscope                            was introduced through the anus and advanced to the                            cecum, identified by appendiceal orifice and                            ileocecal valve. The colonoscopy was performed                            without difficulty. The patient tolerated the                            procedure well. The quality of the bowel                            preparation was good. The ileocecal valve,                            appendiceal orifice, and rectum were photographed. Scope In: 10:33:49 AM Scope Out: 10:45:32 AM Scope Withdrawal Time: 0 hours 10 minutes 25 seconds  Total Procedure Duration: 0 hours 11 minutes 43 seconds  Findings:      The digital rectal exam was normal.      A 2 mm polyp was found in the ascending colon. The polyp was sessile.       The polyp was removed with a cold snare. Resection and retrieval were       complete.  Multiple small-mouthed diverticula were found in the sigmoid colon and       descending colon. There was no evidence of peridiverticular erythema or       segmental colitis.      There is no endoscopic evidence of inflammation in the entire colon.      The exam was otherwise without abnormality on direct and retroflexion       views. Impression:               - One 2 mm polyp in the ascending colon, removed                            with a cold snare. Resected and retrieved.                           - Diverticulosis in the sigmoid colon and in the                            descending colon.                           - The examination was otherwise normal on direct                             and retroflexion views.                           - No evidence of colitis. Moderate Sedation:      N/A Recommendation:           - Patient has a contact number available for                            emergencies. The signs and symptoms of potential                            delayed complications were discussed with the                            patient. Return to normal activities tomorrow.                            Written discharge instructions were provided to the                            patient.                           - Resume previous diet.                           - Continue present medications.                           - Await pathology results.                           - Repeat colonoscopy is  recommended. The                            colonoscopy date will be determined after pathology                            results from today's exam become available for                            review. Procedure Code(s):        --- Professional ---                           [phone], Colonoscopy, flexible; with removal of                            tumor(s), polyp(s), or other lesion(s) by snare                            technique Diagnosis Code(s):        --- Professional ---                           D12.2, Benign neoplasm of ascending colon                           K64.8, Other hemorrhoids                           K57.30, Diverticulosis of large intestine without                            perforation or abscess without bleeding                           R93.3, Abnormal findings on diagnostic imaging of                            other parts of digestive tract CPT copyright 2018 [employer]. All rights reserved. The codes documented in this report are preliminary and upon coder review may  be revised to meet current compliance requirements. Jerene Bears, MD 10/22/2018 10:55:04 AM This report has been signed electronically. Number of Addenda: 0

## 2018-10-23 ENCOUNTER — Encounter: Payer: Self-pay | Admitting: Internal Medicine

## 2018-12-10 ENCOUNTER — Ambulatory Visit: Payer: Self-pay | Admitting: Obstetrics & Gynecology

## 2019-02-21 ENCOUNTER — Other Ambulatory Visit: Payer: Self-pay

## 2019-02-21 DIAGNOSIS — Z20822 Contact with and (suspected) exposure to covid-19: Secondary | ICD-10-CM

## 2019-02-26 LAB — NOVEL CORONAVIRUS, NAA: SARS-CoV-2, NAA: NOT DETECTED

## 2019-04-11 ENCOUNTER — Ambulatory Visit: Payer: Self-pay | Admitting: Obstetrics & Gynecology

## 2019-04-14 IMAGING — CT CT ANGIO CHEST
2 of 6 series · 18 of 46 positions shown · IV contrast (Isovue)
Comparison: PA and lateral chest 12/12/2016.

CLINICAL DATA: Shortness of breath for 2 days.  Elevated d-dimer.

EXAM:
CT ANGIOGRAPHY CHEST WITH CONTRAST
TECHNIQUE: Multidetector CT imaging of the chest was performed using the
standard protocol during bolus administration of intravenous
contrast. Multiplanar CT image reconstructions and MIPs were
obtained to evaluate the vascular anatomy.
CONTRAST:  100 ml [id number]-ZLW IOPAMIDOL (1X62JG-ZLW) INJECTION 76%

[Series 6: thins · axial · 0.71mm/px · z∈[+1245,+1526]mm · 15 of 309 slices shown]
[im 14/309  lung]
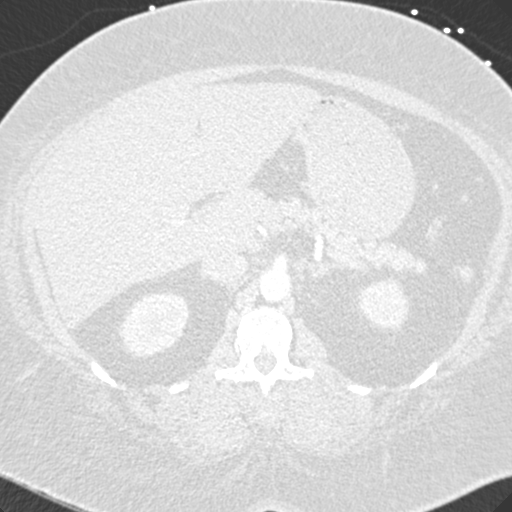
[im 41/309  soft-tissue]
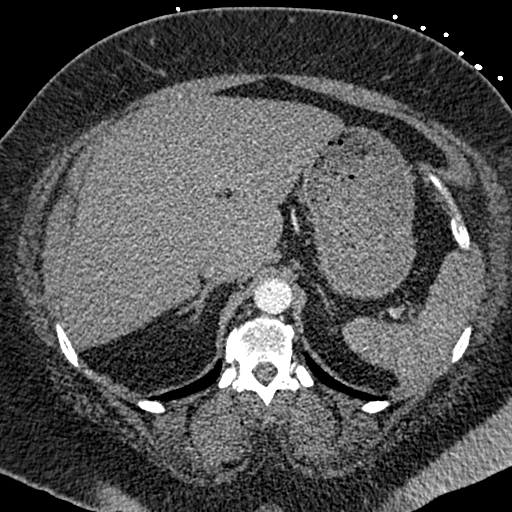
[im 54/309  lung]
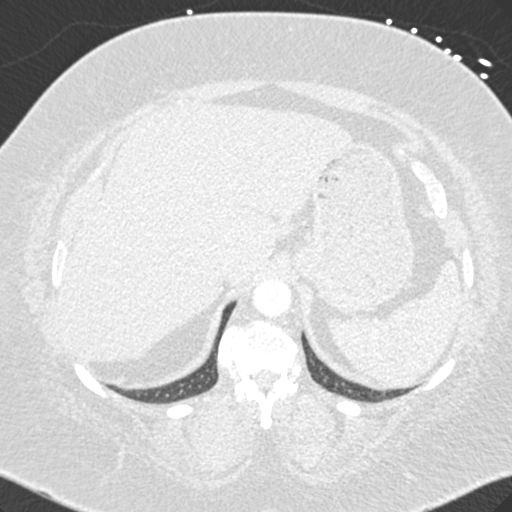
[im 81/309  soft-tissue]
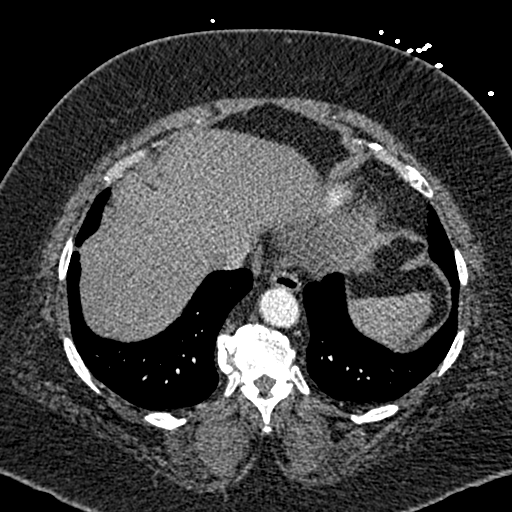
[im 94/309  lung]
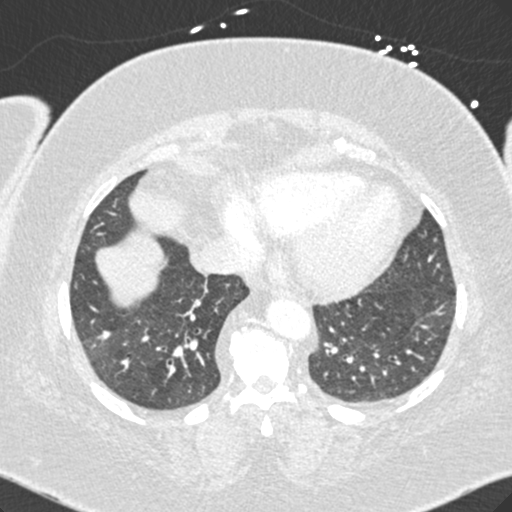
[im 121/309  soft-tissue]
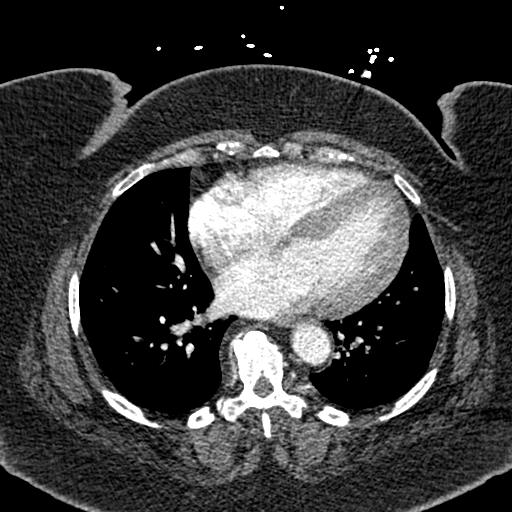
[im 134/309  lung]
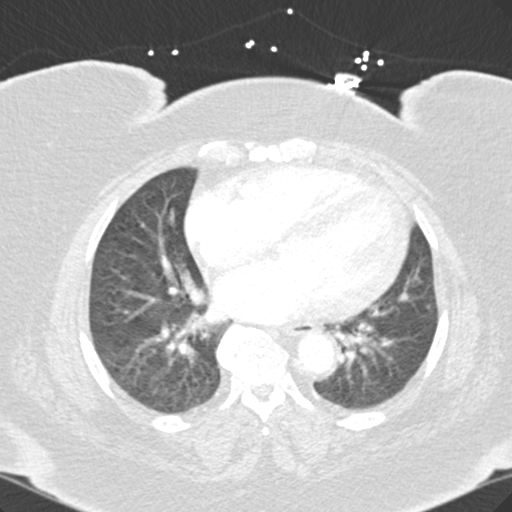
[im 161/309  soft-tissue]
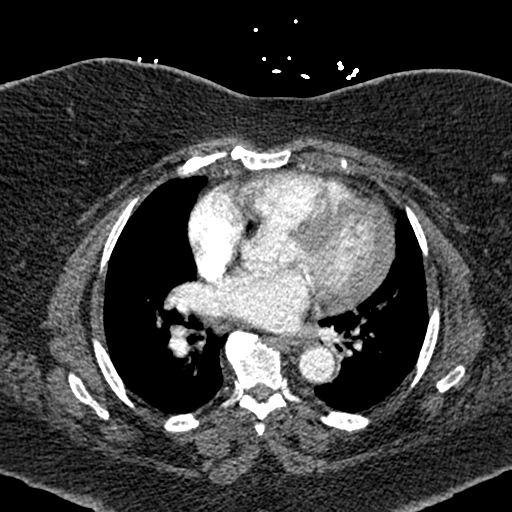
[im 175/309  lung]
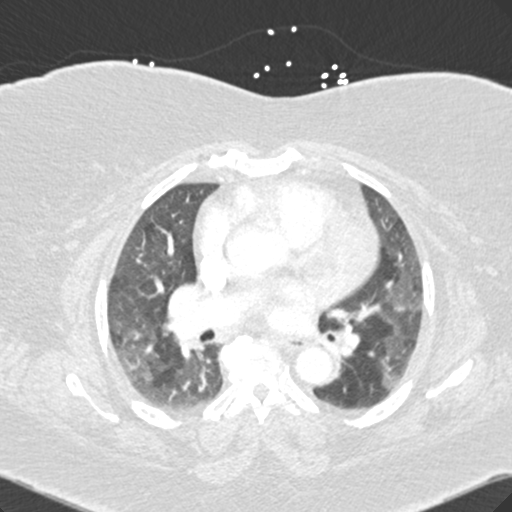
[im 188/309  soft-tissue]
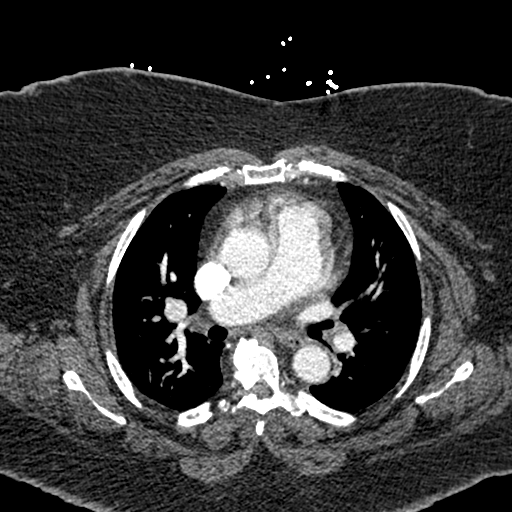
[im 215/309  lung]
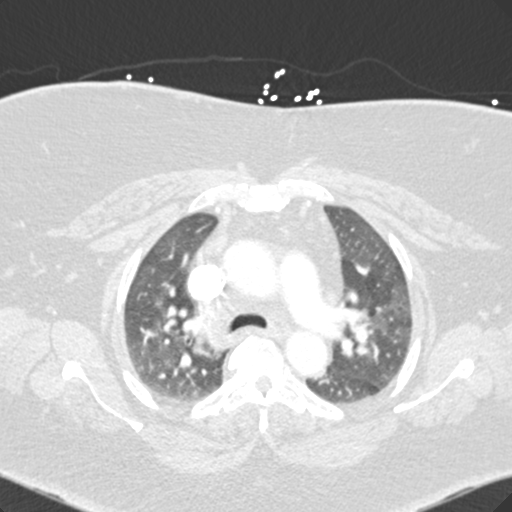
[im 228/309  soft-tissue]
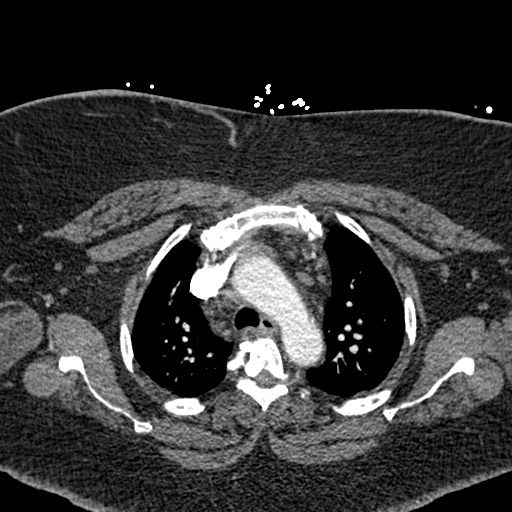
[im 255/309  lung]
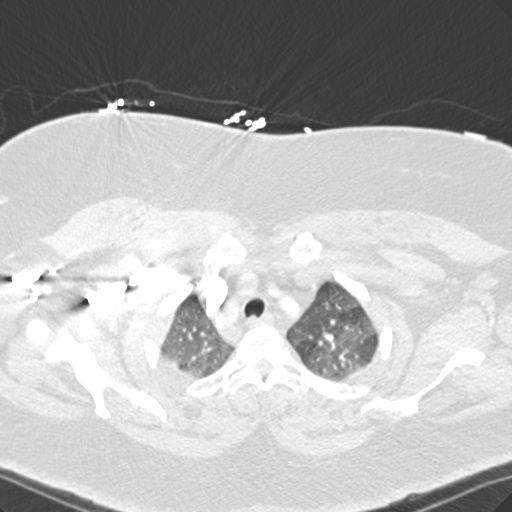
[im 268/309  soft-tissue]
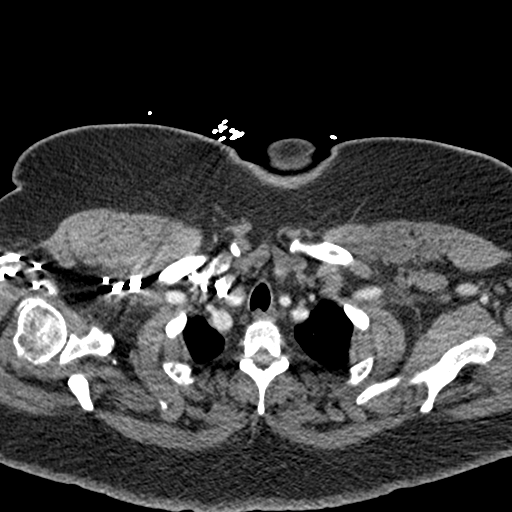
[im 295/309  lung]
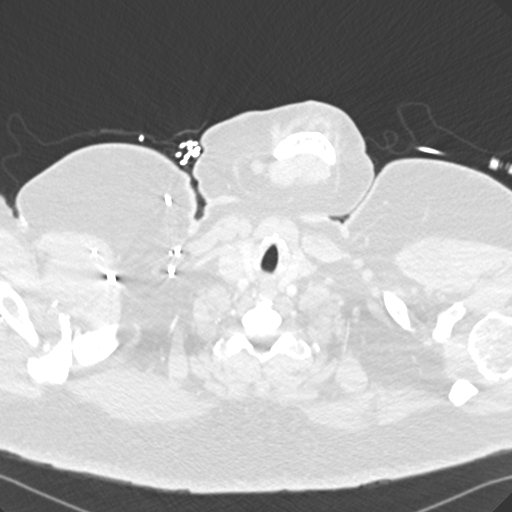

[Series 8: coronal mpr · coronal · 0.62mm/px · 3 of 165 slices shown]
[im 42/165  soft-tissue]
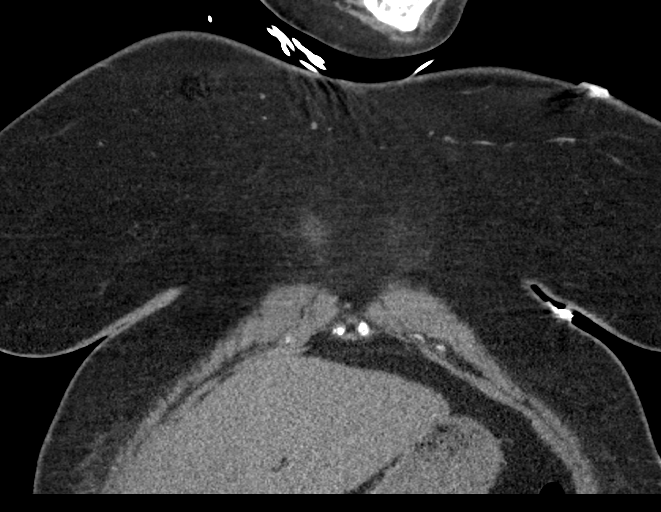
[im 83/165  soft-tissue]
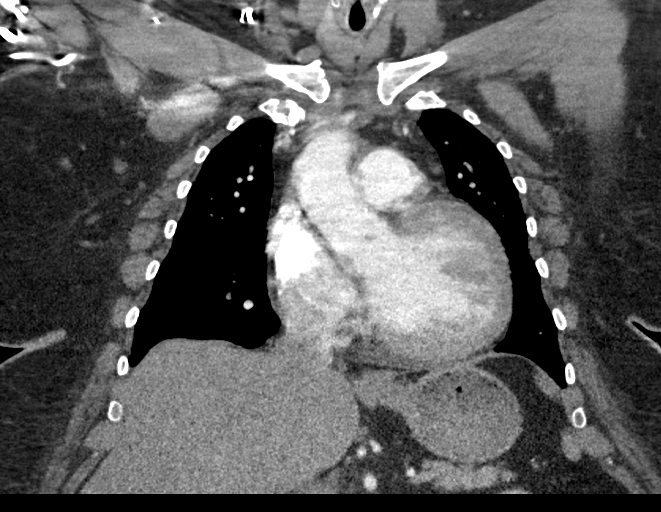
[im 124/165  soft-tissue]
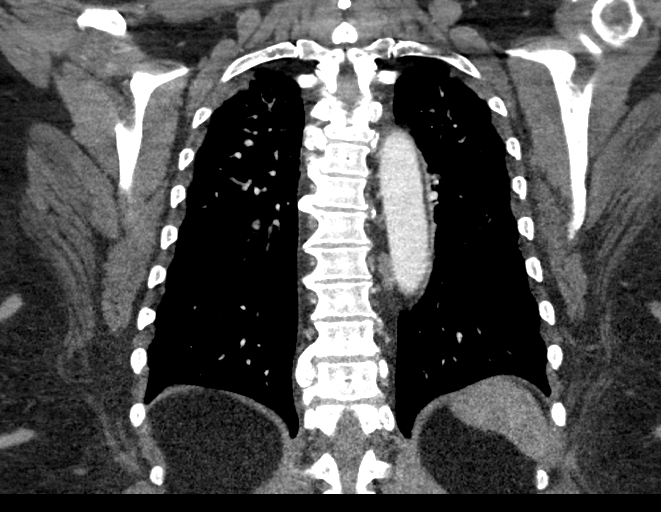

[18 of 46 positions shown; findings below may reference images not displayed]

FINDINGS: Cardiovascular: The exam is degraded by respiratory motion but no
pulmonary embolus is identified. There is cardiomegaly. No
pericardial effusion. A few scattered aortic atherosclerotic
calcifications are noted.

Mediastinum/Nodes: No enlarged mediastinal, hilar, or axillary lymph
nodes. Thyroid gland, trachea, and esophagus demonstrate no
significant findings.

Lungs/Pleura: No pleural effusion. Mild, scattered ground glass
attenuation is seen.

Upper Abdomen: Fatty infiltration of the liver is noted. Otherwise
negative.

Musculoskeletal: No acute bony abnormality.

Review of the MIP images confirms the above findings.
IMPRESSION: Negative for pulmonary embolus.

Scattered ground glass attenuation is likely due to atelectasis.

Cardiomegaly.

Aortic Atherosclerosis ([id number]-EPS.S).

## 2019-04-14 IMAGING — DX DG CHEST 2V
2 series · 2 of 2 positions shown · non-contrast
Comparison: 07/22/2017 chest radiograph

CLINICAL DATA: 56 y/o F; cough, shortness of breath, with onset 2
days ago.

EXAM:
CHEST - 2 VIEW

[chest pa]
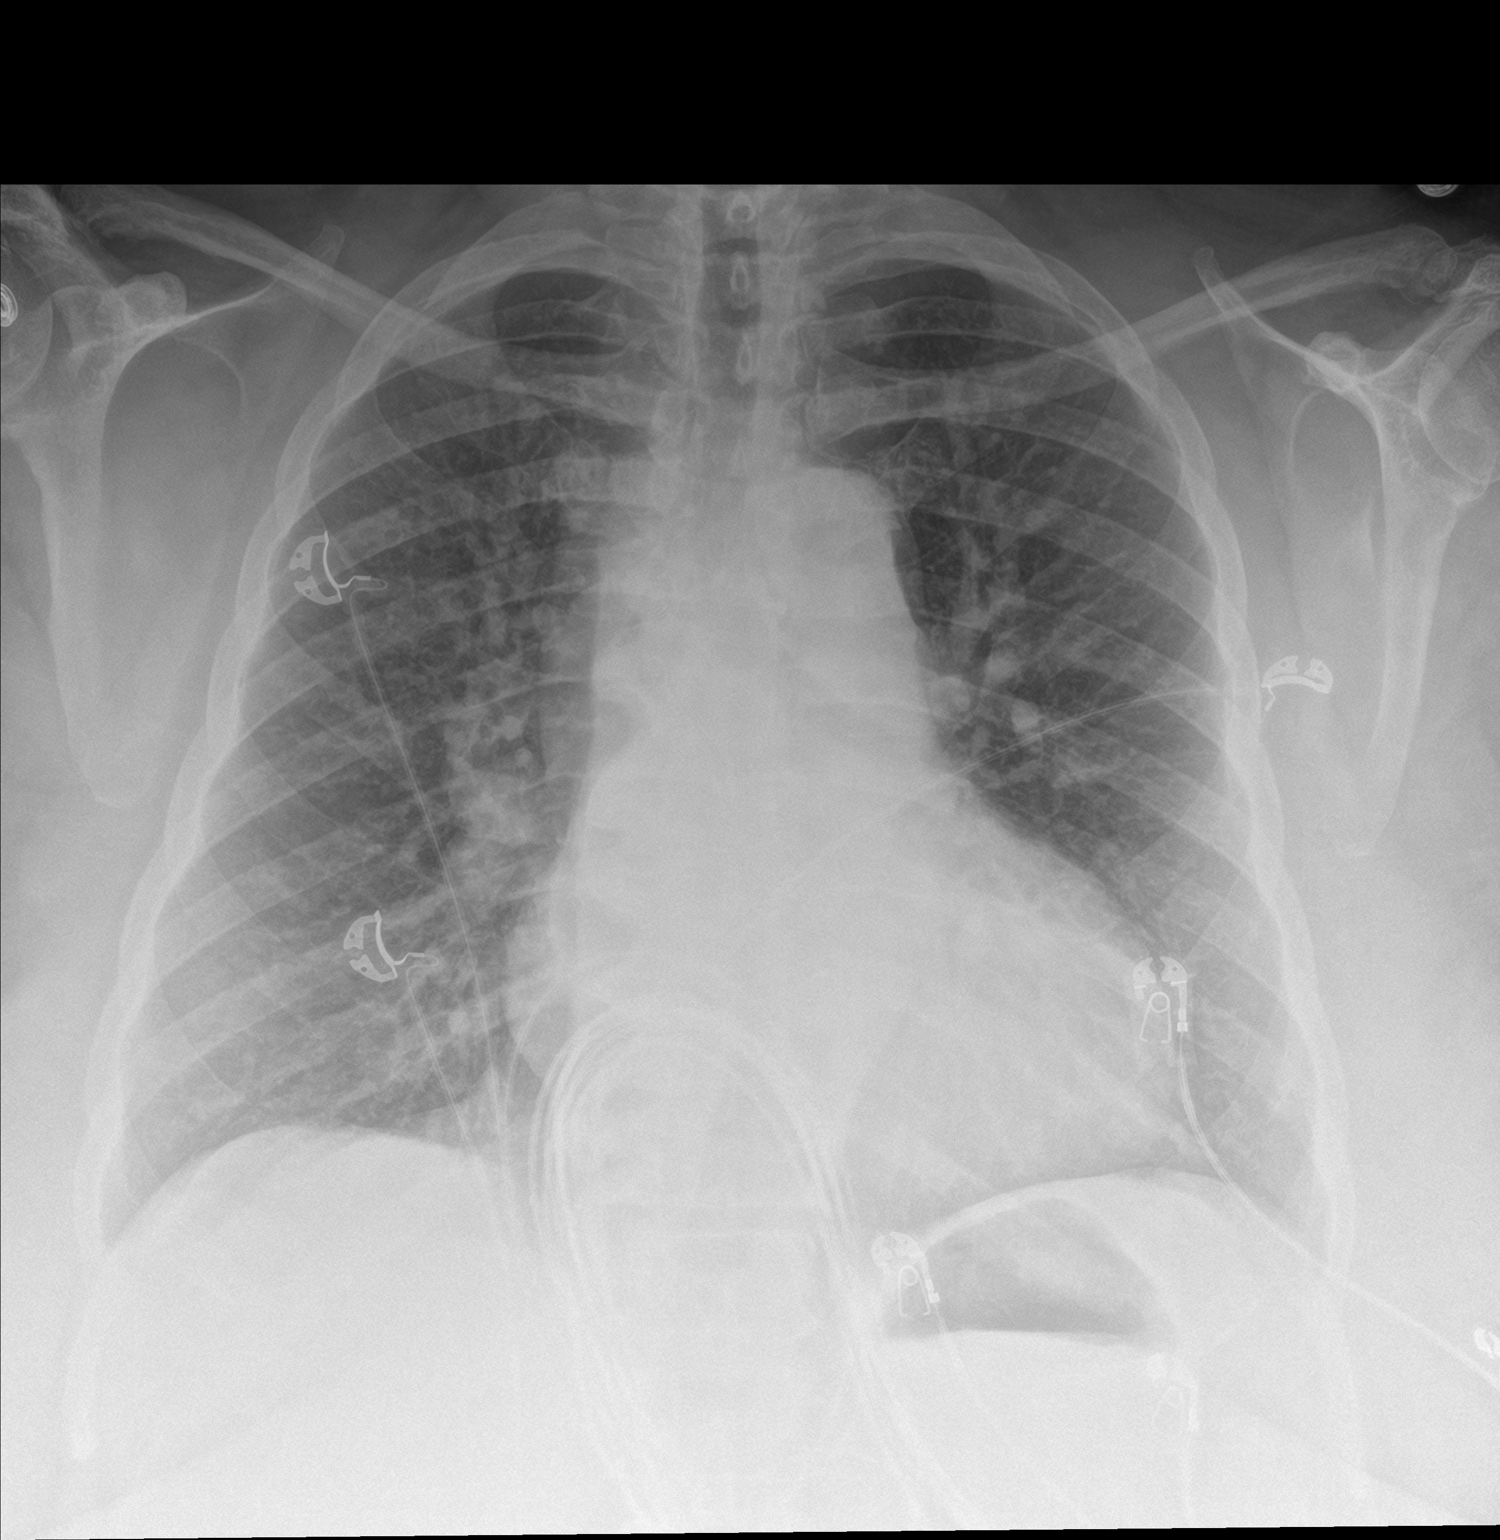

[chest lat]
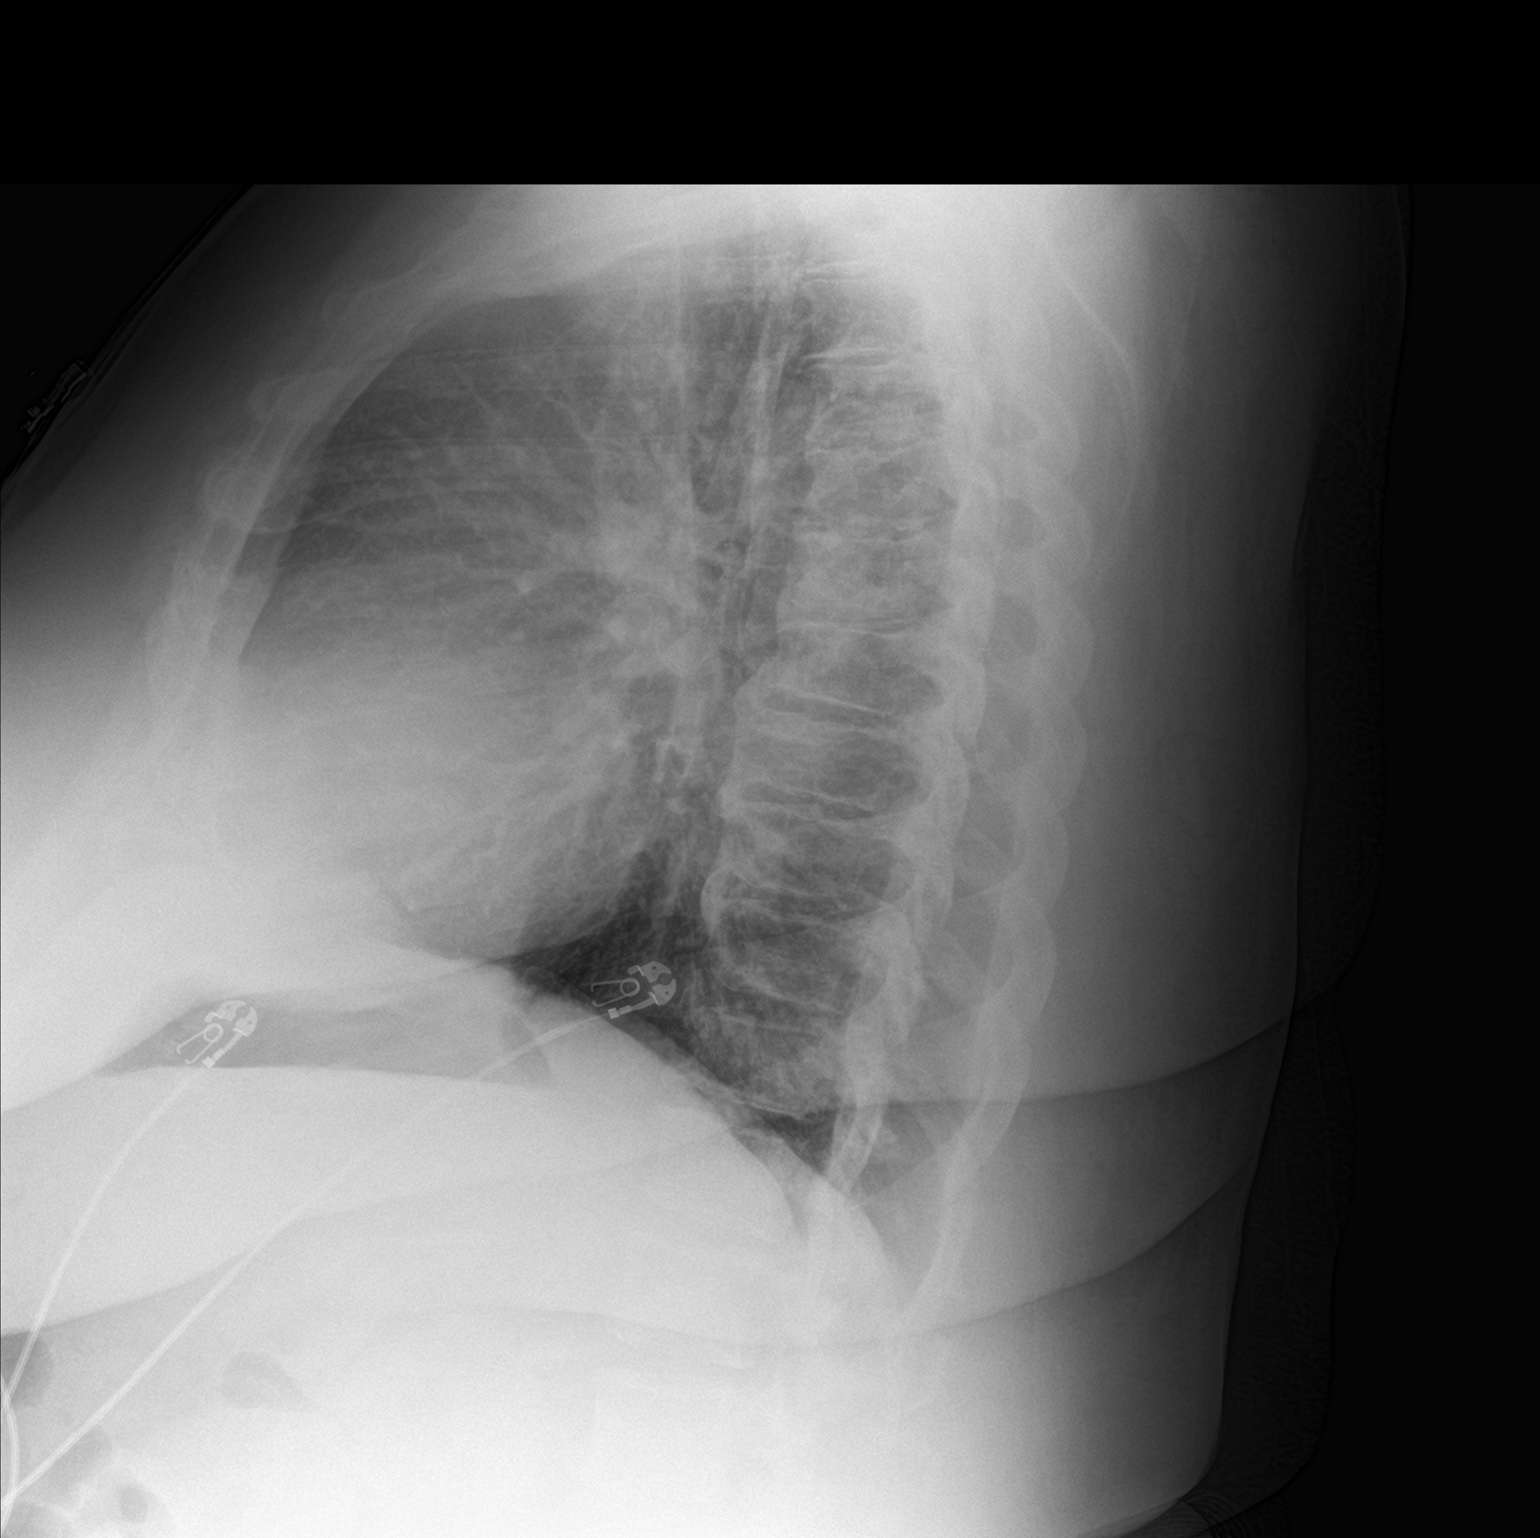

[2 of 2 positions shown; findings below may reference images not displayed]

FINDINGS: Borderline cardiomegaly given projection and technique. Pulmonary
vascular congestion. No consolidation, effusion, or pneumothorax.
Degenerative changes of thoracic spine. No acute osseous abnormality
is evident
IMPRESSION: Borderline cardiomegaly. Pulmonary vascular congestion. No
consolidation.

By: Knuth Sz M.D.

## 2019-04-16 ENCOUNTER — Ambulatory Visit: Payer: 59 | Admitting: Obstetrics & Gynecology

## 2019-05-07 ENCOUNTER — Ambulatory Visit: Payer: 59 | Admitting: Obstetrics & Gynecology

## 2019-05-09 ENCOUNTER — Ambulatory Visit: Payer: 59 | Admitting: Obstetrics & Gynecology

## 2019-07-17 ENCOUNTER — Other Ambulatory Visit: Payer: Self-pay | Admitting: Obstetrics & Gynecology

## 2020-04-29 DIAGNOSIS — K219 Gastro-esophageal reflux disease without esophagitis: Secondary | ICD-10-CM | POA: Insufficient documentation

## 2020-04-29 DIAGNOSIS — R600 Localized edema: Secondary | ICD-10-CM | POA: Insufficient documentation

## 2020-04-29 DIAGNOSIS — R609 Edema, unspecified: Secondary | ICD-10-CM | POA: Insufficient documentation

## 2020-06-16 ENCOUNTER — Ambulatory Visit: Payer: 59 | Admitting: Podiatry

## 2020-06-17 ENCOUNTER — Ambulatory Visit: Payer: 59 | Admitting: Podiatry

## 2020-06-17 ENCOUNTER — Other Ambulatory Visit: Payer: Self-pay

## 2020-06-17 DIAGNOSIS — L600 Ingrowing nail: Secondary | ICD-10-CM

## 2020-06-17 DIAGNOSIS — J449 Chronic obstructive pulmonary disease, unspecified: Secondary | ICD-10-CM | POA: Insufficient documentation

## 2020-06-17 DIAGNOSIS — F419 Anxiety disorder, unspecified: Secondary | ICD-10-CM | POA: Insufficient documentation

## 2020-06-17 MED ORDER — NEOMYCIN-POLYMYXIN-HC 3.5-10000-1 OT SOLN: OTIC | 1 refills | Status: DC

## 2020-06-17 NOTE — Progress Notes (Signed)
Subjective:   Patient ID: Amy Molina, female   DOB: 59 y.o.   MRN: [id number]   HPI Patient presents stating she has a very thickened damaged right big toenail that is painful and states that she tries to trim it and soak it without relief.  She does have to wear a safety shoe at work and this makes it harder and patient does not smoke likes to be active and is moderately obese   Review of Systems  All other systems reviewed and are negative.       Objective:  Physical Exam Vitals and nursing note reviewed.  Constitutional:      Appearance: She is well-developed.  Pulmonary:     Effort: Pulmonary effort is normal.  Musculoskeletal:        General: Normal range of motion.  Skin:    General: Skin is warm.  Neurological:     Mental Status: She is alert.     Neurovascular status intact muscle strength found to be adequate range of motion within normal limits with patient found to have a damaged thickened right hallux nail that is sore when pressed and makes walking difficult.  Patient has good digital perfusion well oriented x3 and has slight discoloration of other nails but not the pain or deformity with history of trauma to the right hallux nail     Assessment:  Damaged right hallux nail with trauma and pain     Plan:  H&P reviewed condition recommended correction.  I did explain removal of the nail and the fact it needs to be done permanently and patient wants to get this done.  Patient understands risk and signed consent form after review and today I infiltrated the right hallux 60 mg like Marcaine mixture sterile prep done and using sterile instrumentation remove the hallux nail exposed matrix applied phenol 5 applications 30 seconds followed by alcohol lavage and sterile dressing.  Gave instructions on soaks and reappoint and encouraged to call with questions and to leave dressing on 24 hours if possible if not take it off earlier if any throbbing pain were to occur

## 2020-06-17 NOTE — Patient Instructions (Signed)
Place 1/4 cup of epsom salts in a quart of warm tap water.  Submerge your foot or feet in the solution and soak for 20 minutes.  This soak should be done twice a day.  Next, remove your foot or feet from solution, blot dry the affected area. Apply ointment and cover if instructed by your doctor.   IF YOUR SKIN BECOMES IRRITATED WHILE USING THESE INSTRUCTIONS, IT IS OKAY TO SWITCH TO  WHITE VINEGAR AND WATER.  As another alternative soak, you may use antibacterial soap and water.  Monitor for any signs/symptoms of infection. Call the office immediately if any occur or go directly to the emergency room. Call with any questions/concerns.  Ingrown Toenail An ingrown toenail occurs when the corner or sides of a toenail grow into the surrounding skin. This causes discomfort and pain. The big toe is most commonly affected, but any of the toes can be affected. If an ingrown toenail is not treated, it can become infected. What are the causes? This condition may be caused by:  Wearing shoes that are too small or tight.  An injury, such as stubbing your toe or having your toe stepped on.  Improper cutting or care of your toenails.  Having nail or foot abnormalities that were present from birth (congenital abnormalities), such as having a nail that is too big for your toe. What increases the risk? The following factors may make you more likely to develop ingrown toenails:  Age. Nails tend to get thicker with age, so ingrown nails are more common among older people.  Cutting your toenails incorrectly, such as cutting them very short or cutting them unevenly. An ingrown toenail is more likely to get infected if you have:  Diabetes.  Blood flow (circulation) problems. What are the signs or symptoms? Symptoms of an ingrown toenail may include:  Pain, soreness, or tenderness.  Redness.  Swelling.  Hardening of the skin that surrounds the toenail. Signs that an ingrown toenail may be infected  include:  Fluid or pus.  Symptoms that get worse instead of better. How is this diagnosed? An ingrown toenail may be diagnosed based on your medical history, your symptoms, and a physical exam. If you have fluid or blood coming from your toenail, a sample may be collected to test for the specific type of bacteria that is causing the infection. How is this treated? Treatment depends on how severe your ingrown toenail is. You may be able to care for your toenail at home.  If you have an infection, you may be prescribed antibiotic medicines.  If you have fluid or pus draining from your toenail, your health care provider may drain it.  If you have trouble walking, you may be given crutches to use.  If you have a severe or infected ingrown toenail, you may need a procedure to remove part or all of the nail. Follow these instructions at home: Foot care   Do not pick at your toenail or try to remove it yourself.  Soak your foot in warm, soapy water. Do this for 20 minutes, 3 times a day, or as often as told by your health care provider. This helps to keep your toe clean and keep your skin soft.  Wear shoes that fit well and are not too tight. Your health care provider may recommend that you wear open-toed shoes while you heal.  Trim your toenails regularly and carefully. Cut your toenails straight across to prevent injury to the skin at the   corners of the toenail. Do not cut your nails in a curved shape.  Keep your feet clean and dry to help prevent infection. Medicines  Take over-the-counter and prescription medicines only as told by your health care provider.  If you were prescribed an antibiotic, take it as told by your health care provider. Do not stop taking the antibiotic even if you start to feel better. Activity  Return to your normal activities as told by your health care provider. Ask your health care provider what activities are safe for you.  Avoid activities that cause  pain. General instructions  If your health care provider told you to use crutches to help you move around, use them as instructed.  Keep all follow-up visits as told by your health care provider. This is important. Contact a health care provider if:  You have more redness, swelling, pain, or other symptoms that do not improve with treatment.  You have fluid, blood, or pus coming from your toenail. Get help right away if:  You have a red streak on your skin that starts at your foot and spreads up your leg.  You have a fever. Summary  An ingrown toenail occurs when the corner or sides of a toenail grow into the surrounding skin. This causes discomfort and pain. The big toe is most commonly affected, but any of the toes can be affected.  If an ingrown toenail is not treated, it can become infected.  Fluid or pus draining from your toenail is a sign of infection. Your health care provider may need to drain it. You may be given antibiotics to treat the infection.  Trimming your toenails regularly and properly can help you prevent an ingrown toenail. This information is not intended to replace advice given to you by your health care provider. Make sure you discuss any questions you have with your health care provider. Document Revised: 12/21/2018 Document Reviewed: 05/17/2017 Elsevier Patient Education  2020 [employer]  

## 2020-06-22 ENCOUNTER — Telehealth: Payer: Self-pay | Admitting: Podiatry

## 2020-06-22 NOTE — Telephone Encounter (Signed)
Patient called stating that she was having issues with soaking. She would remove the bandage and it was stuck then it would bleed.   I called the patient back and the toenail is doing better. She was leaving the bandage on too long but now it is not bleeding. Minimal soreness but no other issues. Encouraged to call back with any other issues.

## 2020-07-24 ENCOUNTER — Other Ambulatory Visit: Payer: Self-pay

## 2020-07-24 ENCOUNTER — Ambulatory Visit: Payer: 59 | Admitting: Podiatry

## 2020-07-24 DIAGNOSIS — L600 Ingrowing nail: Secondary | ICD-10-CM

## 2020-07-25 NOTE — Progress Notes (Signed)
Subjective:   Patient ID: Amy Molina, female   DOB: 59 y.o.   MRN: [id number]   HPI Patient presents concerned about crusting of the right big toenail and whether it is growing back    ROS      Objective:  Physical Exam  Neurovascular status intact with crusted material right hallux nail that is localized with no proximal edema erythema or drainage noted      Assessment:  Scab formation that is perfectly normal for 5 weeks after surgery     Plan:  Educated her on this and continue with bandage usage but this should heal uneventfully and eventually peel off.  If any changes occur she will be seen back

## 2020-11-25 ENCOUNTER — Other Ambulatory Visit: Payer: Self-pay | Admitting: Obstetrics & Gynecology

## 2021-01-04 ENCOUNTER — Other Ambulatory Visit: Payer: Self-pay

## 2021-01-04 ENCOUNTER — Emergency Department (HOSPITAL_BASED_OUTPATIENT_CLINIC_OR_DEPARTMENT_OTHER)
Admission: EM | Admit: 2021-01-04 | Discharge: 2021-01-04 | Disposition: A | Discharge status: Home / Self Care | Payer: 59 | Attending: Emergency Medicine | Admitting: Emergency Medicine

## 2021-01-04 ENCOUNTER — Emergency Department (HOSPITAL_BASED_OUTPATIENT_CLINIC_OR_DEPARTMENT_OTHER): Payer: 59 | Admitting: Radiology

## 2021-01-04 ENCOUNTER — Encounter (HOSPITAL_BASED_OUTPATIENT_CLINIC_OR_DEPARTMENT_OTHER): Payer: Self-pay | Admitting: Emergency Medicine

## 2021-01-04 DIAGNOSIS — S99921A Unspecified injury of right foot, initial encounter: Secondary | ICD-10-CM | POA: Diagnosis not present

## 2021-01-04 DIAGNOSIS — E119 Type 2 diabetes mellitus without complications: Secondary | ICD-10-CM | POA: Diagnosis not present

## 2021-01-04 DIAGNOSIS — Z7984 Long term (current) use of oral hypoglycemic drugs: Secondary | ICD-10-CM | POA: Insufficient documentation

## 2021-01-04 DIAGNOSIS — X501XXA Overexertion from prolonged static or awkward postures, initial encounter: Secondary | ICD-10-CM | POA: Diagnosis not present

## 2021-01-04 DIAGNOSIS — I10 Essential (primary) hypertension: Secondary | ICD-10-CM | POA: Insufficient documentation

## 2021-01-04 DIAGNOSIS — Z7982 Long term (current) use of aspirin: Secondary | ICD-10-CM | POA: Insufficient documentation

## 2021-01-04 DIAGNOSIS — T1490XA Injury, unspecified, initial encounter: Secondary | ICD-10-CM

## 2021-01-04 DIAGNOSIS — J45909 Unspecified asthma, uncomplicated: Secondary | ICD-10-CM | POA: Diagnosis not present

## 2021-01-04 DIAGNOSIS — Z79899 Other long term (current) drug therapy: Secondary | ICD-10-CM | POA: Insufficient documentation

## 2021-01-04 MED ORDER — OXYCODONE-ACETAMINOPHEN 5-325 MG PO TABS
1.0000 | ORAL_TABLET | Freq: Once | ORAL | Status: AC
  Administered 2021-01-04: 1 via ORAL
  Filled 2021-01-04: qty 1

## 2021-01-04 NOTE — ED Triage Notes (Signed)
Pt via pov from home with right foot injury. She states she was reaching up high and standing on her tiptoes and heard a pop and then it started hurting. She reports her ankle is fine, but the outside edge of the foot is painful. She is not able to put weight on the foot without intense pain. Pt alert & oriented, nad noted.

## 2021-01-04 NOTE — Discharge Instructions (Addendum)
Recommend follow-up with your primary doctor or orthopedic doctor.  Take Tylenol or Motrin for pain control.  Recommend bearing weight as tolerated.  If you develop any other new concerning symptoms or your pain is uncontrolled, come back to ER for reassessment.

## 2021-01-04 NOTE — ED Provider Notes (Signed)
[facility] Provider Note   CSN: [id number] Arrival date & time: 01/04/21  1521     History Chief Complaint  Patient presents with  . Foot Injury    Amy Molina is a 60 y.o. female.  Presents to ER with right foot injury.  States that she was reaching up high setting on her tippy toes when she felt a pop in her right foot and sudden pain.  States that pain is in the middle of her foot, no pain in ankle.  Pain is minimal at rest, increased with bearing weight.  Has not taken any pain medicine yet.  No numbness or weakness.  Denies prior injury to foot.  HPI     Past Medical History:  Diagnosis Date  . Anxiety   . Arthritis    general stiffness of joints  . Asthma   . Complication of anesthesia    per pt,"hard to wake up past some sedation!  . Diabetes mellitus ([place])   . Diarrhea   . Heart murmur    childhood years- no mention in adult yrs  . History of indigestion   . Hypertension   . Morbid obesity ([place])   . Seasonal allergies   . Spinal stenosis     Patient Active Problem List   Diagnosis Date Noted  . Anxiety 06/17/2020  . Asthma 06/17/2020  . Gastroesophageal reflux disease 04/29/2020  . Peripheral edema 04/29/2020  . Abnormal CT scan, sigmoid colon   . Benign neoplasm of ascending colon   . Abdominal pain   . [name]   . Tubo-ovarian abscess 07/21/2017  . Uncontrolled type 2 diabetes mellitus with hyperglycemia ([place]) 07/21/2017  . RLQ abdominal pain   . Screening for colon cancer   . Benign neoplasm of rectum   . Fibroid uterus 10/22/2014  . Shortness of breath 11/26/2013  . Hypertension 03/19/2012  . Diabetes mellitus, type 2 ([place]) 03/19/2012  . Obesity, morbid ([place]) 03/19/2012    Past Surgical History:  Procedure Laterality Date  . COLONOSCOPY N/A 08/04/2015   Procedure: COLONOSCOPY;  Surgeon: Jerene Bears, MD;  Location: [facility];  Service: Gastroenterology;  Laterality: N/A;  . COLONOSCOPY     . COLONOSCOPY WITH PROPOFOL N/A 10/22/2018   Procedure: COLONOSCOPY WITH PROPOFOL;  Surgeon: Jerene Bears, MD;  Location: [facility];  Service: Gastroenterology;  Laterality: N/A;  . OOPHORECTOMY  05-2000   REMOVED LEFT TUBE AND OVARY  . POLYPECTOMY  10/22/2018   Procedure: POLYPECTOMY;  Surgeon: Jerene Bears, MD;  Location: [facility];  Service: Gastroenterology;;  . Arnetha Courser TOOTH EXTRACTION       OB History    Gravida  1   Para      Term      Preterm      AB  1   Living  0     SAB  1   IAB      Ectopic      Multiple      Live Births              Family History  Problem Relation Age of Onset  . Heart disease Mother   . Heart attack Mother   . Hypertension Father   . Heart disease Father   . Colon polyps Father   . COPD Father   . Diabetes Sister   . Diabetes Maternal Aunt        7 mat aunts diabetes  . Hypertension  Maternal Aunt   . Lung disease Maternal Aunt   . Hypertension Paternal Aunt   . Breast cancer Paternal Aunt   . Colon cancer Neg Hx     Social History   Tobacco Use  . Smoking status: Never Smoker  . Smokeless tobacco: Never Used  Vaping Use  . Vaping Use: Never used  Substance Use Topics  . Alcohol use: Yes    Alcohol/week: 0.0 standard drinks    Comment: RARELY  . Drug use: No    Home Medications Prior to Admission medications   Medication Sig Start Date End Date Taking? Authorizing Provider  acetaminophen (TYLENOL) 500 MG tablet Take 1,000 mg by mouth 2 (two) times daily as needed for moderate pain.    [provider]  albuterol (PROVENTIL HFA;VENTOLIN HFA) 108 (90 Base) MCG/ACT inhaler Inhale 1-2 puffs into the lungs every 6 (six) hours as needed for wheezing or shortness of breath. Patient taking differently: Inhale 2 puffs into the lungs every 6 (six) hours as needed for wheezing or shortness of breath.  12/12/17   Varney Biles, MD  albuterol (PROVENTIL) (2.5 MG/3ML) 0.083% nebulizer solution Take 2.5 mg by  nebulization every 6 (six) hours as needed for wheezing or shortness of breath.    [provider]  ALPRAZolam Duanne Moron) 0.5 MG tablet Take 1 tablet (0.5 mg total) by mouth 3 (three) times daily as needed for sleep or anxiety. Patient taking differently: Take 0.5 mg by mouth at bedtime as needed for anxiety or sleep.  12/26/17   Florian Buff, MD  ARIPiprazole (ABILIFY) 5 MG tablet aripiprazole 5 mg tablet  TAKE 1 TABLET BY MOUTH ONCE DAILY    [provider]  aspirin 81 MG tablet Take 81 mg by mouth at bedtime.     [provider]  clindamycin (CLEOCIN) 150 MG capsule Take 150 mg by mouth 3 (three) times daily. 06/25/20   [provider]  dicyclomine (BENTYL) 10 MG capsule Take 1 capsule (10 mg total) by mouth 2 (two) times daily. 07/18/18   Willia Craze, NP  escitalopram (LEXAPRO) 20 MG tablet Take 1 tablet by mouth once daily 11/29/20   Florian Buff, MD  furosemide (LASIX) 20 MG tablet Take 20 mg by mouth daily. 04/29/20   [provider]  loperamide (IMODIUM A-D) 2 MG tablet Take 2 mg by mouth as needed for diarrhea or loose stools.    [provider]  loratadine (CLARITIN) 10 MG tablet Take 10 mg by mouth daily.    [provider]  losartan (COZAAR) 50 MG tablet Take 1 tablet (50 mg total) by mouth daily. Patient taking differently: Take 25 mg by mouth daily.  11/27/13   Susy Frizzle, MD  metFORMIN (GLUCOPHAGE) 1000 MG tablet Take 1 tablet (1,000 mg total) by mouth daily with breakfast. 11/27/13   Susy Frizzle, MD  mometasone-formoterol (DULERA) 200-5 MCG/ACT AERO Inhale 2 puffs 2 (two) times daily into the lungs. 07/26/17   Florian Buff, MD  Multiple Vitamin (MULTIVITAMIN WITH MINERALS) TABS tablet Take 1 tablet by mouth every morning.    [provider]  neomycin-polymyxin-hydrocortisone (CORTISPORIN) OTIC solution Apply 1-2 drops to toe after soaking BID 06/17/20   Wallene Huh, DPM  omeprazole (PRILOSEC)  20 MG capsule Take 20 mg by mouth daily.    [provider]  SYMBICORT 160-4.5 MCG/ACT inhaler Inhale 2 puffs into the lungs daily.  12/15/17   [provider]  Tetrahydrozoline HCl (VISINE  OP) Place 1 drop into both eyes daily as needed (dry eyes).    [provider]  vitamin B-12 (CYANOCOBALAMIN) 500 MCG tablet Take 500 mcg by mouth daily.    [provider]    Allergies    Penicillins, Floxin [ofloxacin], Sulfa antibiotics, and Lipitor [atorvastatin]  Review of Systems   Review of Systems  All other systems reviewed and are negative.   Physical Exam Updated Vital Signs BP (!) 156/89 (BP Location: Left Arm)   Pulse 89   Temp 98 F (36.7 C) (Oral)   Resp 20   Ht 5\' 5"  (1.651 m)   Wt (!) 145.2 kg   LMP 08/09/2016   SpO2 100%   BMI 53.25 kg/m   Physical Exam Vitals and nursing note reviewed.  Constitutional:      General: She is not in acute distress.    Appearance: She is well-developed.  HENT:     Head: Normocephalic and atraumatic.  Eyes:     Conjunctiva/sclera: Conjunctivae normal.  Cardiovascular:     Rate and Rhythm: Normal rate.     Pulses: Normal pulses.  Pulmonary:     Effort: Pulmonary effort is normal. No respiratory distress.  Musculoskeletal:     Cervical back: Neck supple.     Comments: Right lower extremity: There is some tenderness palpation over the midfoot, no deformity or bruising noted, normal joint range of motion, normal sensation, pulses  Skin:    General: Skin is warm and dry.  Neurological:     Mental Status: She is alert.     ED Results / Procedures / Treatments   Labs (all labs ordered are listed, but only abnormal results are displayed) Labs Reviewed - No data to display  EKG None  Radiology DG Foot Complete Right  Result Date: 01/04/2021 CLINICAL DATA:  60 year old female with right foot pain. EXAM: RIGHT FOOT COMPLETE - 3+ VIEW COMPARISON:  None. FINDINGS: There is no acute fracture or  dislocation. Mild osteopenia. There is mild degenerative changes and spurring of the dorsum of tarsometatarsal bones. There is diffuse subcutaneous edema. No radiopaque foreign object or soft tissue gas. IMPRESSION: No acute fracture or dislocation. Electronically Signed   By: Anner Crete M.D.   On: 01/04/2021 16:11    Procedures Procedures   Medications Ordered in ED Medications  oxyCODONE-acetaminophen (PERCOCET/ROXICET) 5-325 MG per tablet 1 tablet (has no administration in time range)    ED Course  I have reviewed the triage vital signs and the nursing notes.  Pertinent labs & imaging results that were available during my care of the patient were reviewed by me and considered in my medical decision making (see chart for details).    MDM Rules/Calculators/A&P                         86 related presenting to ER with concern for right foot injury.  Neurovascular intact, no obvious deformity on my exam.  Plain films negative.  Recommend WBAT and follow-up with primary or Ortho.   After the discussed management above, the patient was determined to be safe for discharge.  The patient was in agreement with this plan and all questions regarding their care were answered.  ED return precautions were discussed and the patient will return to the ED with any significant worsening of condition.   Final Clinical Impression(s) / ED Diagnoses Final diagnoses:  Injury  Injury of right foot, initial encounter    Rx /  DC Orders ED Discharge Orders    None       Lucrezia Starch, MD 01/04/21 1739

## 2021-01-04 NOTE — ED Notes (Signed)
Pt discharged home after verbalizing understanding of discharge instructions; nad noted. 

## 2021-01-08 ENCOUNTER — Emergency Department (HOSPITAL_BASED_OUTPATIENT_CLINIC_OR_DEPARTMENT_OTHER): Payer: 59

## 2021-01-08 ENCOUNTER — Emergency Department (HOSPITAL_BASED_OUTPATIENT_CLINIC_OR_DEPARTMENT_OTHER)
Admission: EM | Admit: 2021-01-08 | Discharge: 2021-01-08 | Disposition: A | Discharge status: Home / Self Care | Payer: 59 | Attending: Emergency Medicine | Admitting: Emergency Medicine

## 2021-01-08 ENCOUNTER — Other Ambulatory Visit: Payer: Self-pay

## 2021-01-08 ENCOUNTER — Encounter (HOSPITAL_BASED_OUTPATIENT_CLINIC_OR_DEPARTMENT_OTHER): Payer: Self-pay

## 2021-01-08 DIAGNOSIS — Z7982 Long term (current) use of aspirin: Secondary | ICD-10-CM | POA: Insufficient documentation

## 2021-01-08 DIAGNOSIS — I10 Essential (primary) hypertension: Secondary | ICD-10-CM | POA: Insufficient documentation

## 2021-01-08 DIAGNOSIS — Z7951 Long term (current) use of inhaled steroids: Secondary | ICD-10-CM | POA: Diagnosis not present

## 2021-01-08 DIAGNOSIS — Z79899 Other long term (current) drug therapy: Secondary | ICD-10-CM | POA: Diagnosis not present

## 2021-01-08 DIAGNOSIS — Z7984 Long term (current) use of oral hypoglycemic drugs: Secondary | ICD-10-CM | POA: Diagnosis not present

## 2021-01-08 DIAGNOSIS — J069 Acute upper respiratory infection, unspecified: Secondary | ICD-10-CM | POA: Insufficient documentation

## 2021-01-08 DIAGNOSIS — E119 Type 2 diabetes mellitus without complications: Secondary | ICD-10-CM | POA: Insufficient documentation

## 2021-01-08 DIAGNOSIS — Z20822 Contact with and (suspected) exposure to covid-19: Secondary | ICD-10-CM | POA: Diagnosis not present

## 2021-01-08 DIAGNOSIS — R600 Localized edema: Secondary | ICD-10-CM | POA: Insufficient documentation

## 2021-01-08 DIAGNOSIS — R0789 Other chest pain: Secondary | ICD-10-CM

## 2021-01-08 DIAGNOSIS — J45909 Unspecified asthma, uncomplicated: Secondary | ICD-10-CM | POA: Insufficient documentation

## 2021-01-08 DIAGNOSIS — R0602 Shortness of breath: Secondary | ICD-10-CM | POA: Diagnosis present

## 2021-01-08 LAB — CBC WITH DIFFERENTIAL/PLATELET
Abs Immature Granulocytes: 0.09 10*3/uL — ABNORMAL HIGH (ref 0.00–0.07)
Basophils Absolute: 0.1 10*3/uL (ref 0.0–0.1)
Basophils Relative: 0 %
Eosinophils Absolute: 0.8 10*3/uL — ABNORMAL HIGH (ref 0.0–0.5)
Eosinophils Relative: 5 %
HCT: 41.8 % (ref 36.0–46.0)
Hemoglobin: 13.4 g/dL (ref 12.0–15.0)
Immature Granulocytes: 1 %
Lymphocytes Relative: 22 %
Lymphs Abs: 3.3 10*3/uL (ref 0.7–4.0)
MCH: 30.9 pg (ref 26.0–34.0)
MCHC: 32.1 g/dL (ref 30.0–36.0)
MCV: 96.3 fL (ref 80.0–100.0)
Monocytes Absolute: 1.3 10*3/uL — ABNORMAL HIGH (ref 0.1–1.0)
Monocytes Relative: 9 %
Neutro Abs: 9.4 10*3/uL — ABNORMAL HIGH (ref 1.7–7.7)
Neutrophils Relative %: 63 %
Platelets: 289 10*3/uL (ref 150–400)
RBC: 4.34 MIL/uL (ref 3.87–5.11)
RDW: 13.4 % (ref 11.5–15.5)
WBC: 14.9 10*3/uL — ABNORMAL HIGH (ref 4.0–10.5)
nRBC: 0 % (ref 0.0–0.2)

## 2021-01-08 LAB — COMPREHENSIVE METABOLIC PANEL
ALT: 12 U/L (ref 0–44)
AST: 14 U/L — ABNORMAL LOW (ref 15–41)
Albumin: 3.8 g/dL (ref 3.5–5.0)
Alkaline Phosphatase: 80 U/L (ref 38–126)
Anion gap: 8 (ref 5–15)
BUN: 7 mg/dL (ref 6–20)
CO2: 29 mmol/L (ref 22–32)
Calcium: 9.1 mg/dL (ref 8.9–10.3)
Chloride: 101 mmol/L (ref 98–111)
Creatinine, Ser: 0.66 mg/dL (ref 0.44–1.00)
GFR, Estimated: >60 mL/min (ref >60)
Glucose, Bld: 164 mg/dL — ABNORMAL HIGH (ref 70–99)
Potassium: 4 mmol/L (ref 3.5–5.1)
Sodium: 138 mmol/L (ref 135–145)
Total Bilirubin: 0.7 mg/dL (ref 0.3–1.2)
Total Protein: 7.2 g/dL (ref 6.5–8.1)

## 2021-01-08 LAB — TROPONIN I (HIGH SENSITIVITY): Troponin I (High Sensitivity): 11 ng/L (ref <18)

## 2021-01-08 LAB — RESP PANEL BY RT-PCR (FLU A&B, COVID) ARPGX2
Influenza A by PCR: NEGATIVE
Influenza B by PCR: NEGATIVE
SARS Coronavirus 2 by RT PCR: NEGATIVE

## 2021-01-08 LAB — BRAIN NATRIURETIC PEPTIDE: B Natriuretic Peptide: 70.4 pg/mL (ref 0.0–100.0)

## 2021-01-08 MED ORDER — IPRATROPIUM-ALBUTEROL 0.5-2.5 (3) MG/3ML IN SOLN
3.0000 mL | Freq: Once | RESPIRATORY_TRACT | Status: AC
  Administered 2021-01-08: 3 mL via RESPIRATORY_TRACT
  Filled 2021-01-08: qty 3

## 2021-01-08 NOTE — ED Provider Notes (Signed)
[facility]ENC[facility] DEPT Provider Note   CSN: [id number] Arrival date & time: 01/08/21  [id number]     History Chief Complaint  Patient presents with  . Shortness of Breath    Amy Molina is a 60 y.o. female.  60yo F w/ PMH below including HTN, T2DM, asthma, morbid obesity who p/w cough and SOB.  Patient reports 4 days of cough productive of green phlegm associated with nasal congestion, body aches, sweats and chills, and diarrhea.  No measured fevers and no vomiting or sore throat.  Sister currently has similar symptoms and was evaluated in the ED yesterday, told she was negative for COVID but was diagnosed with pneumonia.  Patient states that she has had progressively worsening shortness of breath.  She also notes a feeling of a knot in her chest that is central to right sided, nonradiating, and has been constant since yesterday.  She has intermittent lower extremity edema which is chronic.  Of note, patient admits that she is not consistently compliant with Symbicort at home. She states she had a viral URI a few weeks ago and was given medications including steroid taper. Symptoms had resolved until this week.  The history is provided by the patient.  Shortness of Breath      Past Medical History:  Diagnosis Date  . Anxiety   . Arthritis    general stiffness of joints  . Asthma   . Complication of anesthesia    per pt,"hard to wake up past some sedation!  . Diabetes mellitus ([place])   . Diarrhea   . Heart murmur    childhood years- no mention in adult yrs  . History of indigestion   . Hypertension   . Morbid obesity ([place])   . Seasonal allergies   . Spinal stenosis     Patient Active Problem List   Diagnosis Date Noted  . Anxiety 06/17/2020  . Asthma 06/17/2020  . Gastroesophageal reflux disease 04/29/2020  . Peripheral edema 04/29/2020  . Abnormal CT scan, sigmoid colon   . Benign neoplasm of ascending colon   . Abdominal pain   . Leiomyoma of body of  uterus   . Tubo-ovarian abscess 07/21/2017  . Uncontrolled type 2 diabetes mellitus with hyperglycemia ([place]) 07/21/2017  . RLQ abdominal pain   . Screening for colon cancer   . Benign neoplasm of rectum   . Fibroid uterus 10/22/2014  . Shortness of breath 11/26/2013  . Hypertension 03/19/2012  . Diabetes mellitus, type 2 ([place]) 03/19/2012  . Obesity, morbid ([place]) 03/19/2012    Past Surgical History:  Procedure Laterality Date  . COLONOSCOPY N/A 08/04/2015   Procedure: COLONOSCOPY;  Surgeon: Jerene Bears, MD;  Location: [facility];  Service: Gastroenterology;  Laterality: N/A;  . COLONOSCOPY    . COLONOSCOPY WITH PROPOFOL N/A 10/22/2018   Procedure: COLONOSCOPY WITH PROPOFOL;  Surgeon: Jerene Bears, MD;  Location: [facility];  Service: Gastroenterology;  Laterality: N/A;  . OOPHORECTOMY  05-2000   REMOVED LEFT TUBE AND OVARY  . POLYPECTOMY  10/22/2018   Procedure: POLYPECTOMY;  Surgeon: Jerene Bears, MD;  Location: [facility];  Service: Gastroenterology;;  . Arnetha Courser TOOTH EXTRACTION       OB History    Gravida  1   Para      Term      Preterm      AB  1   Living  0     SAB  1   IAB  Ectopic      Multiple      Live Births              Family History  Problem Relation Age of Onset  . Heart disease Mother   . Heart attack Mother   . Hypertension Father   . Heart disease Father   . Colon polyps Father   . COPD Father   . Diabetes Sister   . Diabetes Maternal Aunt        7 mat aunts diabetes  . Hypertension Maternal Aunt   . Lung disease Maternal Aunt   . Hypertension Paternal Aunt   . Breast cancer Paternal Aunt   . Colon cancer Neg Hx     Social History   Tobacco Use  . Smoking status: Never Smoker  . Smokeless tobacco: Never Used  Vaping Use  . Vaping Use: Never used  Substance Use Topics  . Alcohol use: Yes    Alcohol/week: 0.0 standard drinks    Comment: RARELY  . Drug use: No    Home Medications Prior to Admission  medications   Medication Sig Start Date End Date Taking? Authorizing Provider  acetaminophen (TYLENOL) 500 MG tablet Take 1,000 mg by mouth 2 (two) times daily as needed for moderate pain.    [provider]  albuterol (PROVENTIL HFA;VENTOLIN HFA) 108 (90 Base) MCG/ACT inhaler Inhale 1-2 puffs into the lungs every 6 (six) hours as needed for wheezing or shortness of breath. Patient taking differently: Inhale 2 puffs into the lungs every 6 (six) hours as needed for wheezing or shortness of breath.  12/12/17   Varney Biles, MD  albuterol (PROVENTIL) (2.5 MG/3ML) 0.083% nebulizer solution Take 2.5 mg by nebulization every 6 (six) hours as needed for wheezing or shortness of breath.    [provider]  ALPRAZolam Duanne Moron) 0.5 MG tablet Take 1 tablet (0.5 mg total) by mouth 3 (three) times daily as needed for sleep or anxiety. Patient taking differently: Take 0.5 mg by mouth at bedtime as needed for anxiety or sleep.  12/26/17   Florian Buff, MD  ARIPiprazole (ABILIFY) 5 MG tablet aripiprazole 5 mg tablet  TAKE 1 TABLET BY MOUTH ONCE DAILY    [provider]  aspirin 81 MG tablet Take 81 mg by mouth at bedtime.     [provider]  clindamycin (CLEOCIN) 150 MG capsule Take 150 mg by mouth 3 (three) times daily. 06/25/20   [provider]  dicyclomine (BENTYL) 10 MG capsule Take 1 capsule (10 mg total) by mouth 2 (two) times daily. 07/18/18   Willia Craze, NP  escitalopram (LEXAPRO) 20 MG tablet Take 1 tablet by mouth once daily 11/29/20   Florian Buff, MD  furosemide (LASIX) 20 MG tablet Take 20 mg by mouth daily. 04/29/20   [provider]  loperamide (IMODIUM A-D) 2 MG tablet Take 2 mg by mouth as needed for diarrhea or loose stools.    [provider]  loratadine (CLARITIN) 10 MG tablet Take 10 mg by mouth daily.    [provider]  losartan (COZAAR) 50 MG tablet Take 1 tablet (50 mg total) by mouth daily. Patient taking  differently: Take 25 mg by mouth daily.  11/27/13   Susy Frizzle, MD  metFORMIN (GLUCOPHAGE) 1000 MG tablet Take 1 tablet (1,000 mg total) by mouth daily with breakfast. 11/27/13   Susy Frizzle, MD  mometasone-formoterol (DULERA) 200-5 MCG/ACT AERO Inhale 2 puffs 2 (two) times  daily into the lungs. 07/26/17   Florian Buff, MD  Multiple Vitamin (MULTIVITAMIN WITH MINERALS) TABS tablet Take 1 tablet by mouth every morning.    [provider]  neomycin-polymyxin-hydrocortisone (CORTISPORIN) OTIC solution Apply 1-2 drops to toe after soaking BID 06/17/20   Wallene Huh, DPM  omeprazole (PRILOSEC) 20 MG capsule Take 20 mg by mouth daily.    [provider]  SYMBICORT 160-4.5 MCG/ACT inhaler Inhale 2 puffs into the lungs daily.  12/15/17   [provider]  Tetrahydrozoline HCl (VISINE OP) Place 1 drop into both eyes daily as needed (dry eyes).    [provider]  vitamin B-12 (CYANOCOBALAMIN) 500 MCG tablet Take 500 mcg by mouth daily.    [provider]    Allergies    Penicillins, Floxin [ofloxacin], Sulfa antibiotics, Lipitor [atorvastatin], and Penicillin g benzathine  Review of Systems   Review of Systems  Respiratory: Positive for shortness of breath.    All other systems reviewed and are negative except that which was mentioned in HPI  Physical Exam Updated Vital Signs BP (!) 146/77   Pulse 86   Temp 98.3 F (36.8 C) (Oral)   Resp 14   Ht 5\' 5"  (1.651 m)   Wt 136.1 kg   LMP 08/09/2016   SpO2 99%   BMI 49.92 kg/m   Physical Exam Vitals and nursing note reviewed.  Constitutional:      General: She is not in acute distress.    Appearance: Normal appearance. She is well-developed. She is obese.  HENT:     Head: Normocephalic and atraumatic.  Eyes:     Conjunctiva/sclera: Conjunctivae normal.  Cardiovascular:     Rate and Rhythm: Normal rate and regular rhythm.     Heart sounds: Normal heart sounds. No murmur  heard.   Pulmonary:     Effort: Pulmonary effort is normal.     Breath sounds: Normal breath sounds. No wheezing.  Abdominal:     General: Abdomen is flat. Bowel sounds are normal. There is no distension.     Palpations: Abdomen is soft.     Tenderness: There is no abdominal tenderness.  Musculoskeletal:     Right lower leg: Edema present.     Left lower leg: Edema present.  Skin:    General: Skin is warm and dry.  Neurological:     Mental Status: She is alert and oriented to person, place, and time.     Comments: fluent  Psychiatric:        Mood and Affect: Mood normal.        Behavior: Behavior normal.     ED Results / Procedures / Treatments   Labs (all labs ordered are listed, but only abnormal results are displayed) Labs Reviewed  COMPREHENSIVE METABOLIC PANEL - Abnormal; Notable for the following components:      Result Value   Glucose, Bld 164 (*)    AST 14 (*)    All other components within normal limits  CBC WITH DIFFERENTIAL/PLATELET - Abnormal; Notable for the following components:   WBC 14.9 (*)    Neutro Abs 9.4 (*)    Monocytes Absolute 1.3 (*)    Eosinophils Absolute 0.8 (*)    Abs Immature Granulocytes 0.09 (*)    All other components within normal limits  RESP PANEL BY RT-PCR (FLU A&B, COVID) ARPGX2  BRAIN NATRIURETIC PEPTIDE  TROPONIN I (HIGH SENSITIVITY)    EKG EKG Interpretation  Date/Time:  Friday January 08 2021  08:29:14 EDT Ventricular Rate:  91 PR Interval:  181 QRS Duration: 89 QT Interval:  352 QTC Calculation: 433 R Axis:   -53 Text Interpretation: Sinus rhythm LAD, consider left anterior fascicular block Abnormal R-wave progression, late transition Left ventricular hypertrophy since previous tracing, PVCs have resolved Confirmed by Theotis Burrow [phone]) on 01/08/2021 8:39:42 AM   Radiology DG Chest Port 1 View  Result Date: 01/08/2021 CLINICAL DATA:  Cough and congestion with shortness of breath EXAM: PORTABLE CHEST 1 VIEW  COMPARISON:  December 12, 2017 chest radiograph and chest CT FINDINGS: No edema or airspace opacity. Heart is borderline enlarged with pulmonary vascularity normal. No adenopathy. No bone lesions. IMPRESSION: Lungs clear. Heart borderline enlarged, stable, with pulmonary vascularity within normal limits. Electronically Signed   By: Lowella Grip III M.D.   On: 01/08/2021 09:59    Procedures Procedures   Medications Ordered in ED Medications  ipratropium-albuterol (DUONEB) 0.5-2.5 (3) MG/3ML nebulizer solution 3 mL (3 mLs Nebulization Given 01/08/21 0940)    ED Course  I have reviewed the triage vital signs and the nursing notes.  Pertinent labs & imaging results that were available during my care of the patient were reviewed by me and considered in my medical decision making (see chart for details).    MDM Rules/Calculators/A&P                          Breathing comfortably on exam with reassuring vital signs.  No wheezing or crackles.  Mild lower extremity edema. EKG reassuring with no ischemic changes. CXR clear w/ no evidence of infiltrate or pulmonary edema.  Labs overall reassuring with normal CMP, WBC 14.9 which is similar to multiple previous values; appears that she has chronic leukocytosis.  BNP and troponin normal.  COVID and influenza negative.  She does not have any wheezing on exam but does report subjective improvement after a DuoNeb here.  Her description of chest pain is very atypical for ACS or PE and given that it has been constant since yesterday with normal work-up here, I do not feel she needs further work-up at this time.  Have encouraged her to be compliant with daily Symbicort and to use albuterol as needed at home.  I have discussed supportive measures for her viral symptoms and instructed to follow-up with PCP for reassessment.  No indication for antibiotics currently.  I have reviewed return precautions and she voiced understanding. Final Clinical Impression(s) / ED  Diagnoses Final diagnoses:  Viral URI with cough  Atypical chest pain    Rx / DC Orders ED Discharge Orders    None       Malinda Mayden, Wenda Overland, MD 01/08/21 1013

## 2021-01-08 NOTE — ED Notes (Signed)
Patient arrived with ShOB. Patient placed on monitor at arrival to room. Vital signs taken and EKG obtained. Patient has a hx of asthma and is on Symbicort for maintenance and albuterol PRN. Patient states she does not take symbicort everyday. Spoke with patient and encouraged her to take her maintenance medications and she agreed to be more consistent with use. Patient BBS diminished on assessment and O2 97% on RA. Patient endorses a productive cough with green sputum. RT assessment completed; RT will continue to monitor patient.

## 2021-01-08 NOTE — ED Triage Notes (Signed)
Intermittent productive cough with SOB x 4 days that is progressively worse this am.  Pt also c/o chest pressure.

## 2021-01-20 ENCOUNTER — Ambulatory Visit: Payer: 59 | Admitting: Podiatry

## 2021-01-20 ENCOUNTER — Ambulatory Visit (INDEPENDENT_AMBULATORY_CARE_PROVIDER_SITE_OTHER): Payer: 59

## 2021-01-20 ENCOUNTER — Encounter: Payer: Self-pay | Admitting: Podiatry

## 2021-01-20 ENCOUNTER — Other Ambulatory Visit: Payer: Self-pay

## 2021-01-20 DIAGNOSIS — M7671 Peroneal tendinitis, right leg: Secondary | ICD-10-CM | POA: Diagnosis not present

## 2021-01-20 DIAGNOSIS — M79671 Pain in right foot: Secondary | ICD-10-CM

## 2021-01-20 DIAGNOSIS — T148XXA Other injury of unspecified body region, initial encounter: Secondary | ICD-10-CM

## 2021-01-20 MED ORDER — TRIAMCINOLONE ACETONIDE 10 MG/ML IJ SUSP
10.0000 mg | Freq: Once | INTRAMUSCULAR | Status: AC
  Administered 2021-01-20: 10 mg

## 2021-01-20 NOTE — Progress Notes (Signed)
Subjective:   Patient ID: Amy Molina, female   DOB: 60 y.o.   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 walk and she is obese which is complicating factor   ROS      Objective:  Physical Exam  Neurovascular status intact with patient's right lateral foot showing inflammation around the peroneal tendon as it comes underneath the lateral malleolus localized and I was unable to ascertain as to whether or not there may be a tear even though it does not appear to be present based on testing     Assessment:  Probability for inflammation of the right peroneal tendon as it comes underneath the lateral malleolus with possibility of tendon tear     Plan:  H&P x-ray reviewed and today I did go ahead and I did a sheath injection explaining risk of this 3 mg Dexasone Kenalog 5 mg Xylocaine and I applied air fracture walker to completely immobilize we will keep her off work for 1 week.  Reappoint for [place] to recheck  X-rays indicate no signs of bony injury fracture associated with this trauma to this area

## 2021-01-28 ENCOUNTER — Telehealth: Payer: Self-pay | Admitting: *Deleted

## 2021-01-28 NOTE — Telephone Encounter (Signed)
Patient is calling to give update on she is doing 1 week after wearing the boot.She is having minimal pain wearing the boot but in a lot of pain without the boot,can also walk without the cane.

## 2021-01-29 DIAGNOSIS — M79676 Pain in unspecified toe(s): Secondary | ICD-10-CM

## 2021-02-03 ENCOUNTER — Other Ambulatory Visit: Payer: Self-pay

## 2021-02-03 ENCOUNTER — Encounter: Payer: Self-pay | Admitting: Podiatry

## 2021-02-03 ENCOUNTER — Ambulatory Visit: Payer: 59 | Admitting: Podiatry

## 2021-02-03 DIAGNOSIS — M7671 Peroneal tendinitis, right leg: Secondary | ICD-10-CM | POA: Diagnosis not present

## 2021-02-03 NOTE — Progress Notes (Signed)
Subjective:   Patient ID: Amy Molina, female   DOB: 60 y.o.   MRN: [id number]   HPI Patient states she has had some improvement but still having pain especially if she goes without her boot   ROS      Objective:  Physical Exam  Neurovascular status intact negative Bevelyn Buckles' sign noted right foot showing improvement with discomfort only upon deep palpation to the peroneal tendon as it comes underneath the lateral malleolus with no other indication of pathology with obesity is complicating factor     Assessment:  Probable peroneal tendinitis right which appears to be improving with immobilization     Plan:  Recommended full immobilization for 2 more weeks and partial immobilization for 4 weeks and will get MRI if symptoms were to get bad again or if it were not to improve

## 2021-02-05 ENCOUNTER — Institutional Professional Consult (permissible substitution): Payer: 59 | Admitting: Internal Medicine

## 2021-02-11 ENCOUNTER — Other Ambulatory Visit: Payer: Self-pay

## 2021-02-11 ENCOUNTER — Encounter: Payer: Self-pay | Admitting: Internal Medicine

## 2021-02-11 ENCOUNTER — Ambulatory Visit: Payer: 59 | Admitting: Internal Medicine

## 2021-02-11 DIAGNOSIS — J449 Chronic obstructive pulmonary disease, unspecified: Secondary | ICD-10-CM

## 2021-02-11 NOTE — Progress Notes (Signed)
Amy Molina, female    DOB: 1960/12/18, 60 y.o.   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 Consult    Shortness of breath with activity  Dyspnea:  Walks a mile twice daily at work but limited by R foot now  Cough: none now  Sleep: recliner low level  SABA use: none now  No obvious day to day or daytime variability or assoc excess/ purulent sputum or mucus plugs or hemoptysis or cp or chest tightness, subjective wheeze or overt sinus or hb symptoms.   Sleeping  without nocturnal  or early am exacerbation  of respiratory  c/o's or need for noct saba. Also denies any obvious fluctuation of symptoms with weather or environmental changes or other aggravating or alleviating factors except as outlined above   No unusual exposure hx or h/o childhood pna/ asthma or knowledge of premature birth.  Current Allergies, Complete Past Medical History, Past Surgical History, Family History, and Social History were reviewed in [employer] record.  ROS  The following are not active complaints unless bolded Hoarseness, sore throat, dysphagia, dental problems, itching, sneezing,  nasal congestion or discharge of excess mucus or purulent secretions, ear ache,   fever, chills, sweats, unintended wt loss or wt gain, classically pleuritic or exertional cp,  orthopnea pnd or arm/hand swelling  or leg  swelling, presyncope, palpitations, abdominal pain, anorexia, nausea, vomiting, diarrhea  or change in bowel habits or change in bladder habits, change in stools or change in urine, dysuria, hematuria,  rash, arthralgias, visual complaints, headache, numbness, weakness or ataxia or problems with walking or coordination,  change in mood or  memory.           Past Medical History:  Diagnosis Date  . Anxiety   . Arthritis    general stiffness of joints  . Asthma   . Complication of anesthesia    per pt,"hard to wake up past some sedation!  . Diabetes mellitus ([place])   . Diarrhea   . Heart murmur    childhood years- no mention in adult yrs  . History of indigestion   . Hypertension   . Morbid obesity ([place])   . Seasonal allergies   . Spinal stenosis     Outpatient Medications Prior to Visit  Medication Sig Dispense Refill  . acetaminophen (TYLENOL) 500 MG tablet Take 1,000 mg by mouth 2 (two) times daily as needed for moderate pain.    Marland Kitchen albuterol (PROVENTIL HFA;VENTOLIN HFA) 108 (90 Base) MCG/ACT inhaler Inhale 1-2 puffs into the lungs every 6 (six) hours as needed for wheezing or shortness of breath. (Patient taking differently: Inhale 2 puffs into the lungs every 6 (six) hours as needed for wheezing or shortness of breath.) 1 Inhaler 0  . albuterol (PROVENTIL) (2.5 MG/3ML) 0.083% nebulizer  solution Take 2.5 mg by nebulization every 6 (six) hours as needed for wheezing or shortness of breath.    . ALPRAZolam (XANAX) 0.5 MG tablet Take 1 tablet (0.5 mg total) by mouth 3 (three) times daily as needed for sleep or anxiety. (Patient taking differently: Take 0.5 mg by mouth at bedtime as needed for anxiety or sleep.) 90 tablet 1  . dicyclomine (BENTYL) 10 MG capsule Take 1 capsule (10 mg total) by mouth 2 (two) times daily. 60 capsule 0  . escitalopram (LEXAPRO) 20 MG tablet Take 1 tablet by mouth once daily 30 tablet 5  . furosemide (LASIX) 20 MG tablet Take 20 mg by mouth daily.    Marland Kitchen  loperamide (IMODIUM A-D) 2 MG tablet Take 2 mg by mouth as needed for diarrhea or loose stools.    Marland Kitchen loratadine (CLARITIN) 10 MG tablet Take 10 mg by mouth daily.    Marland Kitchen losartan (COZAAR) 50 MG tablet Take 1 tablet (50 mg total) by mouth daily. (Patient taking differently: Take 25 mg by mouth daily.) 90 tablet 3  . metFORMIN (GLUCOPHAGE) 1000 MG tablet Take 1 tablet (1,000 mg total) by mouth daily with breakfast. 90 tablet 3  . Multiple Vitamin (MULTIVITAMIN WITH MINERALS) TABS tablet Take 1 tablet by mouth every morning.    Marland Kitchen omeprazole (PRILOSEC) 20 MG capsule Take 20 mg by mouth daily.    . SYMBICORT 160-4.5 MCG/ACT inhaler Inhale 2 puffs into the lungs daily.     . Tetrahydrozoline HCl (VISINE OP) Place 1 drop into both eyes daily as needed (dry eyes).    . vitamin B-12 (CYANOCOBALAMIN) 500 MCG tablet Take 500 mcg by mouth daily.    Marland Kitchen aspirin 81 MG tablet Take 81 mg by mouth at bedtime.  (Patient not taking: Reported on 02/11/2021)    . ARIPiprazole (ABILIFY) 5 MG tablet aripiprazole 5 mg tablet  TAKE 1 TABLET BY MOUTH ONCE DAILY    . clindamycin (CLEOCIN) 150 MG capsule Take 150 mg by mouth 3 (three) times daily.    .     1  . neomycin-polymyxin-hydrocortisone (CORTISPORIN) OTIC solution Apply 1-2 drops to toe after soaking BID 10 mL 1   No facility-administered medications prior to visit.     Objective:     BP (!) 154/96 (BP Location: Left Arm, Cuff Size: Normal)   Pulse 85   Temp 97.6 F (36.4 C) (Temporal)   Ht 5\' 5"  (3.244 m)   Wt (!) 321 lb (145.6 kg)   LMP 08/09/2016   SpO2 96% Comment: Room air  BMI 53.42 kg/m   SpO2: 96 % (Room air)    HEENT : pt wearing mask not removed for exam due to covid -19 concerns.    NECK :  without JVD/Nodes/TM/ nl carotid upstrokes bilaterally   LUNGS: no acc muscle use,  Nl contour chest which is clear to A and P bilaterally without cough on insp or exp maneuvers   CV:  RRR  no s3 or murmur or increase in P2, and no edema   ABD:   Obese soft and nontender with nl inspiratory excursion in the supine position. No bruits or organomegaly appreciated, bowel sounds nl  MS:  Nl gait/ ext warm without deformities, calf tenderness, cyanosis or clubbing R foot boot    SKIN: warm and dry without lesions    NEURO:  alert, approp, nl sensorium with  no motor or cerebellar deficits apparent.    I personally reviewed images and agree with  radiology impression as follows:  CXR:   12/19/20  Lungs clear. Heart borderline enlarged, stable, with pulmonary vascularity within normal limits.  Labs ordered 02/11/2021  :  allergy profile         Assessment   Asthmatic bronchitis , chronic ([place]) Onset 2010 - Allergy profile 02/11/2021 >  Eos 0. /  IgE   - 02/11/2021  After extensive coaching inhaler device,  effectiveness =    75% from a baselin of 25 % > changed symb 160 from 2 pffs daily  to Take 2 puffs first thing in am and then another 2 puffs about 12 hours later.   Having freq flares using saba too much with freq prednisone with wt gain but note always responds to pred well   DDX of  difficult airways management almost all start with A and  include Adherence, Ace Inhibitors, Acid Reflux, Active Sinus Disease, Alpha 1 Antitripsin deficiency, Anxiety masquerading as Airways dz,  ABPA,  Allergy(esp in young), Aspiration (esp in elderly), Adverse effects of meds,  Active smoking or vaping, A bunch of PE's (a small clot burden can't cause this syndrome unless there is already severe underlying pulm or vascular dz with poor reserve) plus two Bs  = Bronchiectasis and Beta blocker use..and one C= CHF   Adherence is always the initial "prime suspect" and is a multilayered concern that requires a "trust but verify" approach in every patient - starting with knowing how to use medications, especially inhalers, correctly, keeping up with refills and understanding the fundamental difference between maintenance and prns vs those medications only taken for  a very short course and then stopped and not refilled.  - see hfa teaching - return with all meds in hand using a trust but verify approach to confirm accurate Medication  Reconciliation The principal here is that until we are certain that the  patients are doing what we've asked, it makes no sense to ask them to do more.  ? Allergy/ abpa > check igE/ Eos and continue high dose ics, consider adding singulair if not controlling   ? Acid (or non-acid) GERD > always difficult to exclude as up to 75% of pts in some series report no assoc GI/ Heartburn symptoms> rec continue  ppi qd ac     ? Anxiety related to wt gain/ conditioning > advised on how to tol the difference between this and asthma based on:  I spent extra time with pt today reviewing appropriate use of albuterol for prn use on exertion with the following points: 1) saba is for relief of sob that does not improve by walking a slower pace or resting but rather if the pt does not improve after trying this first. 2) If the pt is convinced, as many are, that saba helps recover from activity faster then it's easy to tell if this is the case by re-challenging : ie stop, take the inhaler, then p 5 minutes try the exact same activity (intensity of workload) that just caused the symptoms and see if they are substantially diminished or not after saba 3) if there is an activity that reproducibly causes the symptoms, try the saba 15 min before the activity on alternate days   If in fact the saba really does help, then fine to continue to use it prn but advised may need to look closer at the maintenance regimen being used to achieve better control of airways disease with exertion.    ? A bunch of pe's >  pos risk with R foot in boot and obesity > check d dimer:  D dimer nl - while a normal  or high normal value (seen commonly in the elderly or chronically ill)  may miss small peripheral pe, the clot burden with sob is moderately high and the d dimer  has a  very high neg pred value if used in this setting.      Obesity, morbid ([place]) Body mass index is 53.42 kg/m.    No results found for: TSH   Contributing to gerd risk/ doe/reviewed the need and the process to achieve and maintain neg calorie balance > defer f/u primary care including intermittently monitoring thyroid status           Each maintenance medication was reviewed in detail including emphasizing most importantly the difference between maintenance and prns and under what circumstances the prns are to be triggered using an action plan format where appropriate.  Total time for H and P, chart review, counseling, reviewing hfa device(s) and generating customized AVS unique to this office visit / same day charting = 45 min           Christinia Gully, MD 02/11/2021

## 2021-02-11 NOTE — Assessment & Plan Note (Addendum)
Onset 2010 - Allergy profile 02/11/2021 >  Eos 0. /  IgE   - 02/11/2021  After extensive coaching inhaler device,  effectiveness =    75% from a baselin of 25 % > changed symb 160 from 2 pffs daily  to Take 2 puffs first thing in am and then another 2 puffs about 12 hours later.   Having freq flares using saba too much with freq prednisone with wt gain but note always responds to pred well   DDX of  difficult airways management almost all start with A and  include Adherence, Ace Inhibitors, Acid Reflux, Active Sinus Disease, Alpha 1 Antitripsin deficiency, Anxiety masquerading as Airways dz,  ABPA,  Allergy(esp in young), Aspiration (esp in elderly), Adverse effects of meds,  Active smoking or vaping, A bunch of PE's (a small clot burden can't cause this syndrome unless there is already severe underlying pulm or vascular dz with poor reserve) plus two Bs  = Bronchiectasis and Beta blocker use..and one C= CHF   Adherence is always the initial "prime suspect" and is a multilayered concern that requires a "trust but verify" approach in every patient - starting with knowing how to use medications, especially inhalers, correctly, keeping up with refills and understanding the fundamental difference between maintenance and prns vs those medications only taken for a very short course and then stopped and not refilled.  - see hfa teaching - return with all meds in hand using a trust but verify approach to confirm accurate Medication  Reconciliation The principal here is that until we are certain that the  patients are doing what we've asked, it makes no sense to ask them to do more.  ? Allergy/ abpa > check igE/ Eos and continue high dose ics, consider adding singulair if not controlling   ? Acid (or non-acid) GERD > always difficult to exclude as up to 75% of pts in some series report no assoc GI/ Heartburn symptoms> rec continue  ppi qd ac     ? Anxiety related to wt gain/ conditioning > advised on how to tol  the difference between this and asthma based on:  I spent extra time with pt today reviewing appropriate use of albuterol for prn use on exertion with the following points: 1) saba is for relief of sob that does not improve by walking a slower pace or resting but rather if the pt does not improve after trying this first. 2) If the pt is convinced, as many are, that saba helps recover from activity faster then it's easy to tell if this is the case by re-challenging : ie stop, take the inhaler, then p 5 minutes try the exact same activity (intensity of workload) that just caused the symptoms and see if they are substantially diminished or not after saba 3) if there is an activity that reproducibly causes the symptoms, try the saba 15 min before the activity on alternate days   If in fact the saba really does help, then fine to continue to use it prn but advised may need to look closer at the maintenance regimen being used to achieve better control of airways disease with exertion.    ? A bunch of pe's > pos risk with R foot in boot and obesity > check d dimer:  D dimer nl - while a normal  or high normal value (seen commonly in the elderly or chronically ill)  may miss small peripheral pe, the clot burden with sob is moderately  high and the d dimer  has a very high neg pred value if used in this setting.

## 2021-02-11 NOTE — Patient Instructions (Addendum)
Plan A = Automatic = Always=    Symbicort 160 Take 2 puffs first thing in am and then another 2 puffs about 12 hours later.   Work on inhaler technique:  relax and gently blow all the way out then take a nice smooth full deep breath back in, triggering the inhaler at same time you start breathing in.  Hold for up to 5 seconds if you can. Blow out thru nose. Rinse and gargle with water when done.  If mouth or throat bother you at all,  try brushing teeth/gums/tongue with arm and hammer toothpaste/ make a slurry and gargle and spit out.    Plan B = Backup (to supplement plan A, not to replace it) Only use your albuterol inhaler as a rescue medication to be used if you can't catch your breath by resting or doing a relaxed purse lip breathing pattern.  - The less you use it, the better it will work when you need it. - Ok to use the inhaler up to 2 puffs  every 4 hours if you must but call for appointment if use goes up over your usual need - Don't leave home without it !!  (think of it like the spare tire for your car)   Plan C = Crisis (instead of Plan B but only if Plan B stops working) - only use your albuterol nebulizer if you first try Plan B and it fails to help > ok to use the nebulizer up to every 4 hours but if start needing it regularly call for immediate appointment  Try albuterol 15 min before an activity (on alternating days)  that you know would make you short of breath and see if it makes any difference and if makes none then don't take albuterol after activity unless you can't catch your breath as this means it's the resting that helps, not the albuterol.       Please remember to go to the lab department @ [facility] for your tests - we will call you with the results when they are available.  Please schedule a follow up office visit in 6 weeks, call sooner if needed with PFTs first if possible

## 2021-02-11 NOTE — Assessment & Plan Note (Addendum)
Body mass index is 53.42 kg/m.    No results found for: TSH   Contributing to gerd risk/ doe/reviewed the need and the process to achieve and maintain neg calorie balance > defer f/u primary care including intermittently monitoring thyroid status           Each maintenance medication was reviewed in detail including emphasizing most importantly the difference between maintenance and prns and under what circumstances the prns are to be triggered using an action plan format where appropriate.  Total time for H and P, chart review, counseling, reviewing hfa device(s) and generating customized AVS unique to this office visit / same day charting = 48 min

## 2021-02-18 ENCOUNTER — Other Ambulatory Visit: Payer: Self-pay

## 2021-02-18 ENCOUNTER — Other Ambulatory Visit (HOSPITAL_COMMUNITY)
Admission: RE | Admit: 2021-02-18 | Discharge: 2021-02-18 | Disposition: A | Discharge status: Home / Self Care | Payer: 59 | Source: Ambulatory Visit | Attending: Internal Medicine | Admitting: Internal Medicine

## 2021-02-18 DIAGNOSIS — J449 Chronic obstructive pulmonary disease, unspecified: Secondary | ICD-10-CM | POA: Insufficient documentation

## 2021-02-18 LAB — CBC WITH DIFFERENTIAL/PLATELET
Abs Immature Granulocytes: 0.06 10*3/uL (ref 0.00–0.07)
Basophils Absolute: 0.1 10*3/uL (ref 0.0–0.1)
Basophils Relative: 0 %
Eosinophils Absolute: 0.4 10*3/uL (ref 0.0–0.5)
Eosinophils Relative: 3 %
HCT: 43 % (ref 36.0–46.0)
Hemoglobin: 13.6 g/dL (ref 12.0–15.0)
Immature Granulocytes: 1 %
Lymphocytes Relative: 33 %
Lymphs Abs: 3.9 10*3/uL (ref 0.7–4.0)
MCH: 31.5 pg (ref 26.0–34.0)
MCHC: 31.6 g/dL (ref 30.0–36.0)
MCV: 99.5 fL (ref 80.0–100.0)
Monocytes Absolute: 1.1 10*3/uL — ABNORMAL HIGH (ref 0.1–1.0)
Monocytes Relative: 9 %
Neutro Abs: 6.4 10*3/uL (ref 1.7–7.7)
Neutrophils Relative %: 54 %
Platelets: 254 10*3/uL (ref 150–400)
RBC: 4.32 MIL/uL (ref 3.87–5.11)
RDW: 13.4 % (ref 11.5–15.5)
WBC: 11.9 10*3/uL — ABNORMAL HIGH (ref 4.0–10.5)
nRBC: 0 % (ref 0.0–0.2)

## 2021-02-18 LAB — D-DIMER, QUANTITATIVE: D-Dimer, Quant: 1.42 ug/mL-FEU — ABNORMAL HIGH (ref 0.00–0.50)

## 2021-02-19 NOTE — Progress Notes (Signed)
Tried calling the pt and there was no answer and no option to leave a msg due to vm being full. Will call back.

## 2021-02-22 ENCOUNTER — Other Ambulatory Visit: Payer: Self-pay | Admitting: Internal Medicine

## 2021-02-22 ENCOUNTER — Ambulatory Visit: Payer: 59

## 2021-02-22 DIAGNOSIS — R7989 Other specified abnormal findings of blood chemistry: Secondary | ICD-10-CM

## 2021-02-22 DIAGNOSIS — R0602 Shortness of breath: Secondary | ICD-10-CM

## 2021-02-23 ENCOUNTER — Encounter (HOSPITAL_COMMUNITY)
Admission: RE | Admit: 2021-02-23 | Discharge: 2021-02-23 | Disposition: A | Discharge status: Home / Self Care | Payer: 59 | Source: Ambulatory Visit | Attending: Internal Medicine | Admitting: Internal Medicine

## 2021-02-23 ENCOUNTER — Other Ambulatory Visit: Payer: Self-pay

## 2021-02-23 ENCOUNTER — Ambulatory Visit (HOSPITAL_COMMUNITY)
Admission: RE | Admit: 2021-02-23 | Discharge: 2021-02-23 | Disposition: A | Discharge status: Home / Self Care | Payer: 59 | Source: Ambulatory Visit | Attending: Internal Medicine | Admitting: Internal Medicine

## 2021-02-23 ENCOUNTER — Ambulatory Visit (INDEPENDENT_AMBULATORY_CARE_PROVIDER_SITE_OTHER): Payer: 59 | Admitting: Primary Care

## 2021-02-23 DIAGNOSIS — R7989 Other specified abnormal findings of blood chemistry: Secondary | ICD-10-CM

## 2021-02-23 DIAGNOSIS — R0602 Shortness of breath: Secondary | ICD-10-CM | POA: Insufficient documentation

## 2021-02-23 MED ORDER — TECHNETIUM TO 99M ALBUMIN AGGREGATED
4.0000 | Freq: Once | INTRAVENOUS | Status: AC | PRN
  Administered 2021-02-23: 4 via INTRAVENOUS

## 2021-02-23 NOTE — Progress Notes (Signed)
D-dimer elevated @ 1.42, concern for occult DVT and PE. Dr. Melvyn Novas ordered for VQ scan and venous dopplers.  Kidney function normal in April 2022. Not pregnant. Acute ankle injury.   Peer-to-peer to review insurance denial of VQ scan.   Two perfusion studies were ordered VQ scan with perfusion ([id number]) was APPROVED - [id number]

## 2021-02-24 ENCOUNTER — Ambulatory Visit (HOSPITAL_COMMUNITY)
Admission: RE | Admit: 2021-02-24 | Discharge: 2021-02-24 | Disposition: A | Discharge status: Home / Self Care | Payer: 59 | Source: Ambulatory Visit | Attending: Internal Medicine | Admitting: Internal Medicine

## 2021-02-24 DIAGNOSIS — R0602 Shortness of breath: Secondary | ICD-10-CM | POA: Diagnosis present

## 2021-02-24 DIAGNOSIS — R7989 Other specified abnormal findings of blood chemistry: Secondary | ICD-10-CM

## 2021-02-25 LAB — IGE: IgE (Immunoglobulin E), Serum: 2130 IU/mL — ABNORMAL HIGH (ref 6–495)

## 2021-02-26 ENCOUNTER — Other Ambulatory Visit: Payer: Self-pay

## 2021-02-26 ENCOUNTER — Other Ambulatory Visit (HOSPITAL_COMMUNITY)
Admission: RE | Admit: 2021-02-26 | Discharge: 2021-02-26 | Disposition: A | Discharge status: Home / Self Care | Payer: 59 | Source: Ambulatory Visit | Attending: Internal Medicine | Admitting: Internal Medicine

## 2021-02-26 DIAGNOSIS — Z20822 Contact with and (suspected) exposure to covid-19: Secondary | ICD-10-CM | POA: Diagnosis not present

## 2021-02-26 DIAGNOSIS — Z01812 Encounter for preprocedural laboratory examination: Secondary | ICD-10-CM | POA: Insufficient documentation

## 2021-02-27 LAB — SARS CORONAVIRUS 2 (TAT 6-24 HRS): SARS Coronavirus 2: NEGATIVE

## 2021-03-01 ENCOUNTER — Other Ambulatory Visit: Payer: Self-pay

## 2021-03-01 ENCOUNTER — Ambulatory Visit (HOSPITAL_COMMUNITY)
Admission: RE | Admit: 2021-03-01 | Discharge: 2021-03-01 | Disposition: A | Discharge status: Home / Self Care | Payer: 59 | Source: Ambulatory Visit | Attending: Internal Medicine | Admitting: Internal Medicine

## 2021-03-01 DIAGNOSIS — J449 Chronic obstructive pulmonary disease, unspecified: Secondary | ICD-10-CM | POA: Insufficient documentation

## 2021-03-01 LAB — PULMONARY FUNCTION TEST
DL/VA % pred: 138 %
DL/VA: 5.86 ml/min/mmHg/L
DLCO cor % pred: 95 %
DLCO cor: 19.54 ml/min/mmHg
DLCO unc % pred: 90 %
DLCO unc: 18.57 ml/min/mmHg
FEF 25-75 Post: 1.37 L/sec
FEF 25-75 Pre: 1.97 L/sec
FEF2575-%Change-Post: -30 %
FEF2575-%Pred-Post: 62 %
FEF2575-%Pred-Pre: 89 %
FEV1-%Change-Post: -3 %
FEV1-%Pred-Post: 72 %
FEV1-%Pred-Pre: 75 %
FEV1-Post: 1.57 L
FEV1-Pre: 1.63 L
FEV1FVC-%Change-Post: -6 %
FEV1FVC-%Pred-Pre: 109 %
FEV6-%Change-Post: 3 %
FEV6-%Pred-Post: 72 %
FEV6-%Pred-Pre: 70 %
FEV6-Post: 1.93 L
FEV6-Pre: 1.87 L
FEV6FVC-%Pred-Post: 103 %
FEV6FVC-%Pred-Pre: 103 %
FVC-%Change-Post: 3 %
FVC-%Pred-Post: 70 %
FVC-%Pred-Pre: 68 %
FVC-Post: 1.93 L
FVC-Pre: 1.87 L
Post FEV1/FVC ratio: 81 %
Post FEV6/FVC ratio: 100 %
Pre FEV1/FVC ratio: 87 %
Pre FEV6/FVC Ratio: 100 %
RV % pred: 72 %
RV: 1.4 L
TLC % pred: 71 %
TLC: 3.62 L

## 2021-03-01 MED ORDER — ALBUTEROL SULFATE (2.5 MG/3ML) 0.083% IN NEBU
2.5000 mg | INHALATION_SOLUTION | Freq: Once | RESPIRATORY_TRACT | Status: AC
  Administered 2021-03-01: 2.5 mg via RESPIRATORY_TRACT

## 2021-03-01 NOTE — Progress Notes (Signed)
LMTCB

## 2021-03-03 ENCOUNTER — Other Ambulatory Visit: Payer: Self-pay | Admitting: Internal Medicine

## 2021-03-03 DIAGNOSIS — R768 Other specified abnormal immunological findings in serum: Secondary | ICD-10-CM

## 2021-03-03 DIAGNOSIS — J449 Chronic obstructive pulmonary disease, unspecified: Secondary | ICD-10-CM

## 2021-03-03 NOTE — Progress Notes (Signed)
Spoke with pt and notified of results per Dr. Wert. Pt verbalized understanding and denied any questions. 

## 2021-03-03 NOTE — Progress Notes (Signed)
Spoke with pt and notified of results per Dr. Melvyn Novas. Pt verbalized understanding and denied any questions. She was agreeable to allergy referral and this was placed.

## 2021-03-10 ENCOUNTER — Encounter: Payer: Self-pay | Admitting: Podiatry

## 2021-03-10 ENCOUNTER — Ambulatory Visit: Payer: 59 | Admitting: Podiatry

## 2021-03-10 ENCOUNTER — Other Ambulatory Visit: Payer: Self-pay

## 2021-03-10 DIAGNOSIS — M79671 Pain in right foot: Secondary | ICD-10-CM | POA: Diagnosis not present

## 2021-03-10 DIAGNOSIS — M7671 Peroneal tendinitis, right leg: Secondary | ICD-10-CM

## 2021-03-10 NOTE — Progress Notes (Signed)
Subjective:   Patient ID: Amy Molina, female   DOB: 60 y.o.   MRN: [id number]   HPI Patient presents stating that she is improving while still having pain but better and swelling has gone down   ROS      Objective:  Physical Exam  Neuro vascular status intact with patient's right lateral foot improved with pain still noted upon deep palpation but better with no indication of tendon dysfunction     Assessment:  Improving peroneal tendinitis with immobilization shoe gear support ice and anti-inflammatories     Plan:  H&P continue to reduce the utilization of the boot continue ice compression and if symptoms remain low we will not need to be seen back but will be seen back as needed

## 2021-03-26 ENCOUNTER — Ambulatory Visit: Payer: 59 | Admitting: Internal Medicine

## 2021-03-29 NOTE — Progress Notes (Signed)
Spoke with pt and notified of results per Dr. Wert. Pt verbalized understanding and denied any questions. 

## 2021-04-23 ENCOUNTER — Ambulatory Visit: Payer: 59 | Admitting: Allergy & Immunology

## 2021-04-23 ENCOUNTER — Ambulatory Visit: Payer: 59 | Admitting: Internal Medicine

## 2021-06-02 ENCOUNTER — Ambulatory Visit: Payer: 59 | Admitting: Allergy & Immunology

## 2021-06-02 ENCOUNTER — Other Ambulatory Visit: Payer: Self-pay

## 2021-06-02 ENCOUNTER — Encounter: Payer: Self-pay | Admitting: Allergy & Immunology

## 2021-06-02 VITALS — BP 148/76 | HR 75 | Temp 97.6°F | Resp 18 | Ht 65.0 in | Wt 322.6 lb

## 2021-06-02 DIAGNOSIS — R21 Rash and other nonspecific skin eruption: Secondary | ICD-10-CM | POA: Insufficient documentation

## 2021-06-02 DIAGNOSIS — J4540 Moderate persistent asthma, uncomplicated: Secondary | ICD-10-CM

## 2021-06-02 DIAGNOSIS — J310 Chronic rhinitis: Secondary | ICD-10-CM | POA: Diagnosis not present

## 2021-06-02 MED ORDER — TRIAMCINOLONE ACETONIDE 0.5 % EX OINT: 1.0000 "application " | TOPICAL_OINTMENT | Freq: Two times a day (BID) | CUTANEOUS | 2 refills | Status: DC | PRN

## 2021-06-02 MED ORDER — SYMBICORT 160-4.5 MCG/ACT IN AERO: 2.0000 | INHALATION_SPRAY | Freq: Two times a day (BID) | RESPIRATORY_TRACT | 2 refills | Status: DC

## 2021-06-02 MED ORDER — AEROCHAMBER PLUS FLO-VU LARGE MISC: 1 refills | Status: AC

## 2021-06-02 NOTE — Patient Instructions (Addendum)
1. Moderate persistent asthma, uncomplicated - Lung function was in the 50% range and it improved slightly with the albuterol. - We are adding a spacer to help with medication delivery. 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 - Information provided and consent signed. - Tammy will run numbers and see what the out of pocket cost will be for you. - I would strongly consider doing this. - Spacer sample and demonstration provided. - Daily controller medication(s): Symbicort 160/4.29mcg two puffs twice daily with spacer - Prior to physical activity: albuterol 2 puffs 10-15 minutes before physical activity. - Rescue medications: albuterol 4 puffs every 4-6 hours as needed - Asthma control goals:  * Full participation in all desired activities (may need albuterol before activity) * Albuterol use two time or less a week on average (not counting use with activity) * Cough interfering with sleep two time or less a month * Oral steroids no more than once a year * No hospitalizations  2. Chronic rhinitis - Testing today showed: grasses, trees, indoor molds, outdoor molds, dust mites, cat, dog, and cockroach - Copy of test results provided.  - Avoidance measures provided. - Continue with: Alavert once daily as you are doing - Start taking: Nasacort (triamcinolone) one spray per nostril daily as needed on particularly bad days. - You can use an extra dose of the antihistamine, if needed, for breakthrough symptoms.  - Consider nasal saline rinses 1-2 times daily to remove allergens from the nasal cavities as well as help with mucous clearance (this is especially helpful to do before the nasal sprays are given) - Consider allergy shots as a means of long-term control. - Allergy shots "re-train" and "reset" the immune system to ignore environmental allergens and decrease the resulting immune response to those allergens (sneezing, itchy watery eyes, runny nose, nasal congestion,  etc).    - Allergy shots improve symptoms in 75-85% of patients.   3. Rash - Add on triamcinolone ointment twice daily as needed. - We should work harder to get this rash under control. - Avoid using on the face, but you can use on your legs and back.   4. Return in about 6 weeks (around 07/14/2021).    Please inform us of any Emergency Department visits, hospitalizations, or changes in symptoms. Call us before going to the ED for breathing or allergy symptoms since we might be able to fit you in for a sick visit. Feel free to contact us anytime with any questions, problems, or concerns.  It was a pleasure to meet you today!  Websites that have reliable patient information: 1. American Academy of Asthma, Allergy, and Immunology: www.aaaai.org 2. Food Allergy Research and Education (FARE): foodallergy.org 3. Mothers of Asthmatics: http://www.asthmacommunitynetwork.org 4. American College of Allergy, Asthma, and Immunology: www.acaai.org   COVID-19 Vaccine Information can be found at: [email or web address] For questions related to vaccine distribution or appointments, please email [email or web address] or call [phone].   We realize that you might be concerned about having an allergic reaction to the COVID19 vaccines. To help with that concern, WE ARE OFFERING THE COVID19 VACCINES IN OUR OFFICE! Ask the front desk for dates!     "Like" [place] on Facebook and Instagram for our latest updates!      A healthy democracy works best when [employer] participate! Make sure you are registered to vote! If you have moved or changed any of your contact information, you will need to get this updated before voting!  In some cases,  you MAY be able to register to vote online: [email or web address]    1. Control-Buffer 50% Glycerol Negative   2. Control-Histamine 1 mg/ml 2+   3. Albumin saline Negative   4. [place]  Negative   5. [place] Negative   6. Johnson Negative   7. Shevlin Blue Negative   8. Meadow Fescue Negative   9. Perennial Rye Negative   10. Sweet Vernal Negative   11. Timothy Negative   12. Cocklebur Negative   13. Burweed Marshelder Negative   14. Ragweed, short Negative   15. Ragweed, Giant Negative   16. Plantain,  English Negative   17. Lamb's Quarters Negative   18. Sheep Sorrell Negative   19. Rough Pigweed Negative   20. Marsh Elder, Rough Negative   21. Mugwort, Common Negative   22. Ash mix Negative   23. Birch mix Negative   24. Beech American Negative   25. Box, Elder Negative   26. Cedar, red Negative   27. Cottonwood, [place] Negative   28. Elm mix Negative   29. Hickory Negative   30. Maple mix Negative   31. Oak, [place] mix Negative   32. Pecan Pollen Negative   33. Pine mix Negative   34. Sycamore Eastern Negative   35. Coyle, Black Pollen Negative   36. Alternaria alternata Negative   37. Cladosporium Herbarum Negative   38. Aspergillus mix Negative   39. Penicillium mix Negative   40. Bipolaris sorokiniana (Helminthosporium) Negative   41. Drechslera spicifera (Curvularia) Negative   42. Mucor plumbeus Negative   43. Fusarium moniliforme Negative   44. Aureobasidium pullulans (pullulara) Negative   45. Rhizopus oryzae Negative   46. Botrytis cinera Negative   47. Epicoccum nigrum Negative   48. Phoma betae Negative   49. Candida Albicans Negative   50. Trichophyton mentagrophytes Negative   51. Mite, D Farinae  5,000 AU/ml Negative   52. Mite, D Pteronyssinus  5,000 AU/ml Negative   53. Cat Hair 10,000 BAU/ml Negative   54.  Dog Epithelia Negative   55. Mixed Feathers Negative   56. Horse Epithelia Negative   57. Cockroach, German Negative   58. Mouse Negative   59. Tobacco Leaf Negative     Number of Test 15   Intradermal Select   Control Negative   [place] Negative   Johnson 1+   7 Grass Negative   Ragweed mix Negative   Weed  mix Negative   Tree mix 1+   Mold 1 2+   Mold 2 Negative   Mold 3 3+   Mold 4 3+   Cat 3+   Dog 2+   Cockroach 3+   Mite mix 2+      Reducing Pollen Exposure  The American Academy of Allergy, Asthma and Immunology suggests the following steps to reduce your exposure to pollen during allergy seasons.    Do not hang sheets or clothing out to dry; pollen may collect on these items. Do not mow lawns or spend time around freshly cut grass; mowing stirs up pollen. Keep windows closed at night.  Keep car windows closed while driving. Minimize morning activities outdoors, a time when pollen counts are usually at their highest. Stay indoors as much as possible when pollen counts or humidity is high and on windy days when pollen tends to remain in the air longer. Use air conditioning when possible.  Many air conditioners have filters that trap the pollen spores. Use a HEPA room air filter to remove  pollen form the indoor air you breathe.  Control of Mold Allergen   Mold and fungi can grow on a variety of surfaces provided certain temperature and moisture conditions exist.  Outdoor molds grow on plants, decaying vegetation and soil.  The major outdoor mold, Alternaria and Cladosporium, are found in very high numbers during hot and dry conditions.  Generally, a late Summer - Fall peak is seen for common outdoor fungal spores.  Rain will temporarily lower outdoor mold spore count, but counts rise rapidly when the rainy period ends.  The most important indoor molds are Aspergillus and Penicillium.  Dark, humid and poorly ventilated basements are ideal sites for mold growth.  The next most common sites of mold growth are the bathroom and the kitchen.  Outdoor (Seasonal) Mold Control  Positive outdoor molds via skin testing: Alternaria, Cladosporium, Bipolaris (Helminthsporium), Drechslera (Curvalaria), and Mucor  Use air conditioning and keep windows closed Avoid exposure to decaying  vegetation. Avoid leaf raking. Avoid grain handling. Consider wearing a face mask if working in moldy areas.    Indoor (Perennial) Mold Control   Positive indoor molds via skin testing: Fusarium, Aureobasidium (Pullulara), and Rhizopus  Maintain humidity below 50%. Clean washable surfaces with 5% bleach solution. Remove sources e.g. contaminated carpets.    Control of Dust Mite Allergen    Dust mites play a major role in allergic asthma and rhinitis.  They occur in environments with high humidity wherever human skin is found.  Dust mites absorb humidity from the atmosphere (ie, they do not drink) and feed on organic matter (including shed human and animal skin).  Dust mites are a microscopic type of insect that you cannot see with the naked eye.  High levels of dust mites have been detected from mattresses, pillows, carpets, upholstered furniture, bed covers, clothes, soft toys and any woven material.  The principal allergen of the dust mite is found in its feces.  A gram of dust may contain 1,000 mites and 250,000 fecal particles.  Mite antigen is easily measured in the air during house cleaning activities.  Dust mites do not bite and do not cause harm to humans, other than by triggering allergies/asthma.    Ways to decrease your exposure to dust mites in your home:  Encase mattresses, box springs and pillows with a mite-impermeable barrier or cover   Wash sheets, blankets and drapes weekly in hot water (130 F) with detergent and dry them in a dryer on the hot setting.  Have the room cleaned frequently with a vacuum cleaner and a damp dust-mop.  For carpeting or rugs, vacuuming with a vacuum cleaner equipped with a high-efficiency particulate air (HEPA) filter.  The dust mite allergic individual should not be in a room which is being cleaned and should wait 1 hour after cleaning before going into the room. Do not sleep on upholstered furniture (eg, couches).   If possible removing  carpeting, upholstered furniture and drapery from the home is ideal.  Horizontal blinds should be eliminated in the rooms where the person spends the most time (bedroom, study, television room).  Washable vinyl, roller-type shades are optimal. Remove all non-washable stuffed toys from the bedroom.  Wash stuffed toys weekly like sheets and blankets above.   Reduce indoor humidity to less than 50%.  Inexpensive humidity monitors can be purchased at most hardware stores.  Do not use a humidifier as can make the problem worse and are not recommended.  [email or web address]  Control of Dog or Cat  Allergen  Avoidance is the best way to manage a dog or cat allergy. If you have a dog or cat and are allergic to dog or cats, consider removing the dog or cat from the home. If you have a dog or cat but don't want to find it a new home, or if your family wants a pet even though someone in the household is allergic, here are some strategies that may help keep symptoms at bay:  Keep the pet out of your bedroom and restrict it to only a few rooms. Be advised that keeping the dog or cat in only one room will not limit the allergens to that room. Don't pet, hug or kiss the dog or cat; if you do, wash your hands with soap and water. High-efficiency particulate air (HEPA) cleaners run continuously in a bedroom or living room can reduce allergen levels over time. Regular use of a high-efficiency vacuum cleaner or a central vacuum can reduce allergen levels. Giving your dog or cat a bath at least once a week can reduce airborne allergen.  Allergy Shots   Allergies are the result of a chain reaction that starts in the immune system. Your immune system controls how your body defends itself. For instance, if you have an allergy to pollen, your immune system identifies pollen as an invader or allergen. Your immune system overreacts by producing antibodies called Immunoglobulin E (IgE). These antibodies travel to cells that  release chemicals, causing an allergic reaction.  The concept behind allergy immunotherapy, whether it is received in the form of shots or tablets, is that the immune system can be desensitized to specific allergens that trigger allergy symptoms. Although it requires time and patience, the payback can be long-term relief.  How Do Allergy Shots Work?  Allergy shots work much like a vaccine. Your body responds to injected amounts of a particular allergen given in increasing doses, eventually developing a resistance and tolerance to it. Allergy shots can lead to decreased, minimal or no allergy symptoms.  There generally are two phases: build-up and maintenance. Build-up often ranges from three to six months and involves receiving injections with increasing amounts of the allergens. The shots are typically given once or twice a week, though more rapid build-up schedules are sometimes used.  The maintenance phase begins when the most effective dose is reached. This dose is different for each person, depending on how allergic you are and your response to the build-up injections. Once the maintenance dose is reached, there are longer periods between injections, typically two to four weeks.  Occasionally doctors give cortisone-type shots that can temporarily reduce allergy symptoms. These types of shots are different and should not be confused with allergy immunotherapy shots.  Who Can Be Treated with Allergy Shots?  Allergy shots may be a good treatment approach for people with allergic rhinitis (hay fever), allergic asthma, conjunctivitis (eye allergy) or stinging insect allergy.   Before deciding to begin allergy shots, you should consider:   The length of allergy season and the severity of your symptoms  Whether medications and/or changes to your environment can control your symptoms  Your desire to avoid long-term medication use  Time: allergy immunotherapy requires a major time commitment   Cost: may vary depending on your insurance coverage  Allergy shots for children age 38 and older are effective and often well tolerated. They might prevent the onset of new allergen sensitivities or the progression to asthma.  Allergy shots are not started on patients  who are pregnant but can be continued on patients who become pregnant while receiving them. In some patients with other medical conditions or who take certain common medications, allergy shots may be of risk. It is important to mention other medications you talk to your allergist.   When Will I Feel Better?  Some may experience decreased allergy symptoms during the build-up phase. For others, it may take as long as 12 months on the maintenance dose. If there is no improvement after a year of maintenance, your allergist will discuss other treatment options with you.  If you aren't responding to allergy shots, it may be because there is not enough dose of the allergen in your vaccine or there are missing allergens that were not identified during your allergy testing. Other reasons could be that there are high levels of the allergen in your environment or major exposure to non-allergic triggers like tobacco smoke.  What Is the Length of Treatment?  Once the maintenance dose is reached, allergy shots are generally continued for three to five years. The decision to stop should be discussed with your allergist at that time. Some people may experience a permanent reduction of allergy symptoms. Others may relapse and a longer course of allergy shots can be considered.  What Are the Possible Reactions?  The two types of adverse reactions that can occur with allergy shots are local and systemic. Common local reactions include very mild redness and swelling at the injection site, which can happen immediately or several hours after. A systemic reaction, which is less common, affects the entire body or a particular body system. They are usually  mild and typically respond quickly to medications. Signs include increased allergy symptoms such as sneezing, a stuffy nose or hives.  Rarely, a serious systemic reaction called anaphylaxis can develop. Symptoms include swelling in the throat, wheezing, a feeling of tightness in the chest, nausea or dizziness. Most serious systemic reactions develop within 30 minutes of allergy shots. This is why it is strongly recommended you wait in your doctor's office for 30 minutes after your injections. Your allergist is trained to watch for reactions, and his or her staff is trained and equipped with the proper medications to identify and treat them.  Who Should Administer Allergy Shots?  The preferred location for receiving shots is your prescribing allergist's office. Injections can sometimes be given at another facility where the physician and staff are trained to recognize and treat reactions, and have received instructions by your prescribing allergist.

## 2021-06-02 NOTE — Progress Notes (Addendum)
NEW PATIENT  Date of Service/Encounter:  06/02/21  Consult requested by: Everardo Beals, NP   Assessment:   Moderate persistent asthma, uncomplicated - with AEC 355  Perennial and seasonal allergic rhinitis (grasses, trees, indoor molds, outdoor molds, dust mites, cat, dog, and cockroach)  Rash  Plan/Recommendations:   1. Moderate persistent asthma, uncomplicated - Lung function was in the 50% range and it improved slightly with the albuterol. - We are adding a spacer to help with medication delivery. 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 - Information provided and consent signed. - Tammy will run numbers and see what the out of pocket cost will be for you. - I would strongly consider doing this. - Spacer sample and demonstration provided. - Daily controller medication(s): Symbicort 160/4.17mcg two puffs twice daily with spacer - Prior to physical activity: albuterol 2 puffs 10-15 minutes before physical activity. - Rescue medications: albuterol 4 puffs every 4-6 hours as needed - Asthma control goals:  * Full participation in all desired activities (may need albuterol before activity) * Albuterol use two time or less a week on average (not counting use with activity) * Cough interfering with sleep two time or less a month * Oral steroids no more than once a year * No hospitalizations  2. Chronic rhinitis - Testing today showed: grasses, trees, indoor molds, outdoor molds, dust mites, cat, dog, and cockroach - Copy of test results provided.  - Avoidance measures provided. - Continue with: Alavert once daily as you are doing - Start taking: Nasacort (triamcinolone) one spray per nostril daily as needed on particularly bad days. - You can use an extra dose of the antihistamine, if needed, for breakthrough symptoms.  - Consider nasal saline rinses 1-2 times daily to remove allergens from the nasal cavities as well as help with mucous  clearance (this is especially helpful to do before the nasal sprays are given) - Consider allergy shots as a means of long-term control. - Allergy shots "re-train" and "reset" the immune system to ignore environmental allergens and decrease the resulting immune response to those allergens (sneezing, itchy watery eyes, runny nose, nasal congestion, etc).    - Allergy shots improve symptoms in 75-85% of patients.   3. Rash - Add on triamcinolone ointment twice daily as needed. - We should work harder to get this rash under control. - Avoid using on the face, but you can use on your legs and back.   4. Return in about 6 weeks (around 07/14/2021).    This note in its entirety was forwarded to the Provider who requested this consultation.  Subjective:   Amy Molina is a 60 y.o. female presenting today for evaluation of  Chief Complaint  Patient presents with   Asthma    Amy Molina has a history of the following: Patient Active Problem List   Diagnosis Date Noted   Rash 06/02/2021   Chronic rhinitis 06/02/2021   Moderate persistent asthma, uncomplicated 73/22/0254   Anxiety 06/17/2020   Asthmatic bronchitis , chronic ([place]) 06/17/2020   Gastroesophageal reflux disease 04/29/2020   Peripheral edema 04/29/2020   Abnormal CT scan, sigmoid colon    Benign neoplasm of ascending colon    Abdominal pain    [name]    Tubo-ovarian abscess 07/21/2017   Uncontrolled type 2 diabetes mellitus with hyperglycemia ([place]) 07/21/2017   RLQ abdominal pain    Screening for colon cancer    Benign neoplasm of rectum  Fibroid uterus 10/22/2014   Shortness of breath 11/26/2013   Hypertension 03/19/2012   Diabetes mellitus, type 2 ([place]) 03/19/2012   Obesity, morbid ([place]) 03/19/2012    History obtained from: chart review and patient.  Amy Molina was referred by Everardo Beals, NP.     Amy Molina is a 60 y.o. female presenting for an evaluation of asthma and  allergies .   Asthma/Respiratory Symptom History: She has a history of asthma. She is currently on Symbicort since 2010. This seems to be working well. It as been the only thing that has helped her out. Otherwise nothing was working. She is getting prednisone for her breathing only around twice per year. Change of seasons tends to be the worse.  She reaches for the emergency inhaler. She uses it daily before she goes to work at [employer]. She has to stop and catch her breath. Her PCP was managing her breathing before, but she felt that she was getting worse. This is why she saw Dr. Melvyn Novas. She has never been hospitalized for her breathing. She mostly goes to Urgent Care for treatment, but she did have one which required an ED visit.     Allergic Rhinitis Symptom History: She does report sneezing and itchy watery eyes and runny nose. This is mostly during certain seasons like when the trees start to bloom. This is when it really acts up. She has tried using a variety of medications including fluticasone. She has also been  on azelastine in the past. She was also on Alavert (tablet that dissolves on her tongue). She uses that on her way to work. She is using that once a day. This is enough to control her symptoms, including the runny eyes and hayfever symptoms.   Skin Symptom History: She does have nearly constant itching. This mostly is concentrated on her legs. It looks like stasis dermatitis. There is some itching on her legs. She did have a Dermatologist that actually passed away. She does not have an ointment for this at all. She was diagnosed with cellulitis once and took a systemic antibiotic for this. She currently uses Gold Bond as well as Aquaphor.   Otherwise, there is no history of other atopic diseases, including food allergies, drug allergies, stinging insect allergies, eczema, urticaria, or contact dermatitis. There is no significant infectious history. Vaccinations are up to date.     Past Medical History: Patient Active Problem List   Diagnosis Date Noted   Rash 06/02/2021   Chronic rhinitis 06/02/2021   Moderate persistent asthma, uncomplicated 38/18/2993   Anxiety 06/17/2020   Asthmatic bronchitis , chronic ([place]) 06/17/2020   Gastroesophageal reflux disease 04/29/2020   Peripheral edema 04/29/2020   Abnormal CT scan, sigmoid colon    Benign neoplasm of ascending colon    Abdominal pain    [name]    Tubo-ovarian abscess 07/21/2017   Uncontrolled type 2 diabetes mellitus with hyperglycemia ([place]) 07/21/2017   RLQ abdominal pain    Screening for colon cancer    Benign neoplasm of rectum    Fibroid uterus 10/22/2014   Shortness of breath 11/26/2013   Hypertension 03/19/2012   Diabetes mellitus, type 2 ([place]) 03/19/2012   Obesity, morbid ([place]) 03/19/2012    Medication List:  Allergies as of 06/02/2021       Reactions   Penicillins Anaphylaxis, Shortness Of Breath, Swelling   DID THE REACTION INVOLVE: Swelling of the face/tongue/throat, SOB, or low BP? Yes Sudden or severe rash/hives,  skin peeling, or the inside of the mouth or nose? No Did it require medical treatment? Yes When did it last happen?      15+ years If all above answers are "NO", may proceed with cephalosporin use. PATIENT TOLERATED ZOSYN AND MERREM ON 07/21/17 ADMISSION   Floxin [ofloxacin]    MIGRAINES, INSOMNIA, PARANOIND, RINGING IN EARS   Sulfa Antibiotics Hives   Lipitor [atorvastatin]    Severe myalgias   Penicillin G Benzathine Rash        Medication List        Accurate as of June 02, 2021  1:01 PM. If you have any questions, ask your nurse or doctor.          STOP taking these medications    dicyclomine 10 MG capsule Commonly known as: BENTYL Stopped by: Amy Shaggy, MD   multivitamin with minerals Tabs tablet Stopped by: Amy Shaggy, MD       TAKE these medications    acetaminophen 500 MG tablet Commonly  known as: TYLENOL Take 1,000 mg by mouth 2 (two) times daily as needed for moderate pain.   AeroChamber Plus Flo-Vu Large Misc Use as directed with metered dose inhaler. Started by: Amy Shaggy, MD   albuterol (2.5 MG/3ML) 0.083% nebulizer solution Commonly known as: PROVENTIL Take 2.5 mg by nebulization every 6 (six) hours as needed for wheezing or shortness of breath. What changed: Another medication with the same name was changed. Make sure you understand how and when to take each.   albuterol 108 (90 Base) MCG/ACT inhaler Commonly known as: VENTOLIN HFA Inhale 1-2 puffs into the lungs every 6 (six) hours as needed for wheezing or shortness of breath. What changed: how much to take   ALPRAZolam 0.5 MG tablet Commonly known as: Xanax Take 1 tablet (0.5 mg total) by mouth 3 (three) times daily as needed for sleep or anxiety. What changed: when to take this   aspirin 81 MG tablet Take 81 mg by mouth at bedtime.   escitalopram 20 MG tablet Commonly known as: LEXAPRO Take 1 tablet by mouth once daily   furosemide 20 MG tablet Commonly known as: LASIX Take 20 mg by mouth daily.   loperamide 2 MG tablet Commonly known as: IMODIUM A-D Take 2 mg by mouth as needed for diarrhea or loose stools.   loratadine 10 MG tablet Commonly known as: CLARITIN Take 10 mg by mouth daily.   losartan 50 MG tablet Commonly known as: COZAAR Take 1 tablet (50 mg total) by mouth daily. What changed: how much to take   metFORMIN 1000 MG tablet Commonly known as: GLUCOPHAGE Take 1 tablet (1,000 mg total) by mouth daily with breakfast.   omeprazole 20 MG capsule Commonly known as: PRILOSEC Take 20 mg by mouth daily.   Symbicort 160-4.5 MCG/ACT inhaler Generic drug: budesonide-formoterol Inhale 2 puffs into the lungs daily. What changed: Another medication with the same name was added. Make sure you understand how and when to take each. Changed by: Amy Shaggy, MD    Symbicort 160-4.5 MCG/ACT inhaler Generic drug: budesonide-formoterol Inhale 2 puffs into the lungs in the morning and at bedtime. What changed: You were already taking a medication with the same name, and this prescription was added. Make sure you understand how and when to take each. Changed by: Amy Shaggy, MD   triamcinolone ointment 0.5 % Commonly known as: KENALOG Apply 1 application topically 2 (two) times daily as needed. Started by:  Amy Shaggy, MD   VISINE OP Place 1 drop into both eyes daily as needed (dry eyes).   vitamin B-12 500 MCG tablet Commonly known as: CYANOCOBALAMIN Take 500 mcg by mouth daily.        Birth History: non-contributory  Developmental History: non-contributory  Past Surgical History: Past Surgical History:  Procedure Laterality Date   COLONOSCOPY N/A 08/04/2015   Procedure: COLONOSCOPY;  Surgeon: Jerene Bears, MD;  Location: [facility];  Service: Gastroenterology;  Laterality: N/A;   COLONOSCOPY     COLONOSCOPY WITH PROPOFOL N/A 10/22/2018   Procedure: COLONOSCOPY WITH PROPOFOL;  Surgeon: Jerene Bears, MD;  Location: [facility];  Service: Gastroenterology;  Laterality: N/A;   OOPHORECTOMY  05-2000   REMOVED LEFT TUBE AND OVARY   POLYPECTOMY  10/22/2018   Procedure: POLYPECTOMY;  Surgeon: Jerene Bears, MD;  Location: [facility];  Service: Gastroenterology;;   WISDOM TOOTH EXTRACTION       Family History: Family History  Problem Relation Age of Onset   Eczema Mother    Heart disease Mother    Heart attack Mother    Hypertension Father    Heart disease Father    Colon polyps Father    COPD Father    Asthma Sister    Allergic rhinitis Sister    Diabetes Sister    Diabetes Maternal Aunt        7 mat aunts diabetes   Hypertension Maternal Aunt    Lung disease Maternal Aunt    Hypertension Paternal Aunt    Breast cancer Paternal Aunt    Colon cancer Neg Hx      Social History: Amy Molina lives at home with  her sister and brother in [employer]. 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 She is exposed to tobacco exposure from her brother in law. Her parents are still relatively independent, but her mother has had an MI and breast cancer. They live in a house that is 58 years old. There is carpeting throughout the home. There is electric heating with wood supplemental. There are window units for cooling. There are no animals inside or outside of the home. There is smoke exposure second hand in the home. There is no HEPA filter. There is no exposure to fumes, chemicals, or dust.    Review of Systems  Constitutional: Negative.  Negative for chills, fever, malaise/fatigue and weight loss.  HENT:  Positive for congestion. Negative for ear discharge, ear pain and sinus pain.   Eyes:  Negative for pain, discharge and redness.  Respiratory:  Positive for cough and shortness of breath. Negative for sputum production and wheezing.   Cardiovascular: Negative.  Negative for chest pain and palpitations.  Gastrointestinal:  Negative for abdominal pain, constipation, diarrhea, heartburn, nausea and vomiting.  Skin: Negative.  Negative for itching and rash.  Neurological:  Negative for dizziness and headaches.  Endo/Heme/Allergies:  Positive for environmental allergies. Does not bruise/bleed easily.      Objective:   Blood pressure (!) 148/76, pulse 75, temperature 97.6 F (36.4 C), temperature source Temporal, resp. rate 18, height 5\' 5"  (1.651 m), weight (!) 322 lb 9.6 oz (146.3 kg), last menstrual period 08/09/2016, SpO2 95 %. Body mass index is 53.68 kg/m.   Physical Exam:   Physical Exam Vitals reviewed.  Constitutional:      Appearance: She is well-developed.  HENT:     Head: Normocephalic and atraumatic.     Right  Ear: Tympanic membrane, ear canal and external ear normal. No drainage, swelling or tenderness. Tympanic membrane is not injected, scarred, erythematous,  retracted or bulging.     Left Ear: Tympanic membrane, ear canal and external ear normal. No drainage, swelling or tenderness. Tympanic membrane is not injected, scarred, erythematous, retracted or bulging.     Nose: No nasal deformity, septal deviation, mucosal edema or rhinorrhea.     Right Turbinates: Enlarged, swollen and pale.     Left Turbinates: Enlarged, swollen and pale.     Right Sinus: No maxillary sinus tenderness or frontal sinus tenderness.     Left Sinus: No maxillary sinus tenderness or frontal sinus tenderness.     Comments: No nasal polyps noted.     Mouth/Throat:     Mouth: Mucous membranes are not pale and not dry.     Pharynx: Uvula midline.  Eyes:     General:        Right eye: No discharge.        Left eye: No discharge.     Conjunctiva/sclera: Conjunctivae normal.     Right eye: Right conjunctiva is not injected. No chemosis.    Left eye: Left conjunctiva is not injected. No chemosis.    Pupils: Pupils are equal, round, and reactive to light.  Cardiovascular:     Rate and Rhythm: Normal rate and regular rhythm.     Heart sounds: Normal heart sounds.  Pulmonary:     Effort: Pulmonary effort is normal. No tachypnea, accessory muscle usage or respiratory distress.     Breath sounds: Normal breath sounds. No wheezing, rhonchi or rales.     Comments: Moving air well in all lung fields. No increased work of breathing.  Chest:     Chest wall: No tenderness.  Abdominal:     Tenderness: There is no abdominal tenderness. There is no guarding or rebound.  Lymphadenopathy:     Head:     Right side of head: No submandibular, tonsillar or occipital adenopathy.     Left side of head: No submandibular, tonsillar or occipital adenopathy.     Cervical: No cervical adenopathy.  Skin:    General: Skin is warm.     Capillary Refill: Capillary refill takes less than 2 seconds.     Coloration: Skin is not pale.     Findings: No abrasion, erythema, petechiae or rash. Rash is  not papular, urticarial or vesicular.     Comments: No crackles or wheezes noted.   Neurological:     Mental Status: She is alert.  Psychiatric:        Behavior: Behavior is cooperative.     Diagnostic studies:    Spirometry: results abnormal (FEV1: 1.13/51%, FVC: 1.45/51%, FEV1/FVC: 99%).    Spirometry consistent with possible restrictive disease. Albuterol four puffs via MDI treatment given in clinic with improvement in FEV1 and FVC, but not significant per ATS criteria. The FEF25-75% improved 22%.   Allergy Studies:    Airborne Adult Perc - 06/02/21 1004     Time Antigen Placed 1004    Allergen Manufacturer Greer    Location Back    Number of Test 59    Panel 1 Select    1. Control-Buffer 50% Glycerol Negative    2. Control-Histamine 1 mg/ml 2+    3. Albumin saline Negative    4. Petrey Negative    5. [place] Negative    6. Johnson Negative    7. [place] Blue Negative  8. Meadow Fescue Negative    9. Perennial Rye Negative    10. Sweet Vernal Negative    11. Timothy Negative    12. Cocklebur Negative    13. Burweed Marshelder Negative    14. Ragweed, short Negative    15. Ragweed, Giant Negative    16. Plantain,  English Negative    17. Lamb's Quarters Negative    18. Sheep Sorrell Negative    19. Rough Pigweed Negative    20. Marsh Elder, Rough Negative    21. Mugwort, Common Negative    22. Ash mix Negative    23. Birch mix Negative    24. Beech American Negative    25. Box, Elder Negative    26. Cedar, red Negative    27. Cottonwood, [place] Negative    28. Elm mix Negative    29. Hickory Negative    30. Maple mix Negative    31. Oak, [place] mix Negative    32. Pecan Pollen Negative    33. Pine mix Negative    34. Sycamore Eastern Negative    35. Crosby, Black Pollen Negative    36. Alternaria alternata Negative    37. Cladosporium Herbarum Negative    38. Aspergillus mix Negative    39. Penicillium mix Negative    40. Bipolaris sorokiniana  (Helminthosporium) Negative    41. Drechslera spicifera (Curvularia) Negative    42. Mucor plumbeus Negative    43. Fusarium moniliforme Negative    44. Aureobasidium pullulans (pullulara) Negative    45. Rhizopus oryzae Negative    46. Botrytis cinera Negative    47. Epicoccum nigrum Negative    48. Phoma betae Negative    49. Candida Albicans Negative    50. Trichophyton mentagrophytes Negative    51. Mite, D Farinae  5,000 AU/ml Negative    52. Mite, D Pteronyssinus  5,000 AU/ml Negative    53. Cat Hair 10,000 BAU/ml Negative    54.  Dog Epithelia Negative    55. Mixed Feathers Negative    56. Horse Epithelia Negative    57. Cockroach, German Negative    58. Mouse Negative    59. Tobacco Leaf Negative             Intradermal - 06/02/21 1050     Time Antigen Placed 1050    Allergen Manufacturer Greer    Location Arm    Number of Test 15    Intradermal Select    Control Negative    Guatem[place] Negative    Johnson 1+    7 Grass Negative    Ragweed mix Negative    Weed mix Negative    Tree mix 1+    Mold 1 2+    Mold 2 Negative    Mold 3 3+    Mold 4 3+    Cat 3+    Dog 2+    Cockroach 3+    Mite mix 2+             Allergy testing results were read and interpreted by myself, documented by clinical staff.         Salvatore Marvel, MD [facility]

## 2021-06-03 ENCOUNTER — Telehealth: Payer: Self-pay | Admitting: *Deleted

## 2021-06-03 ENCOUNTER — Encounter: Payer: Self-pay | Admitting: *Deleted

## 2021-06-03 NOTE — Telephone Encounter (Signed)
Tried to reach patient to discuss possible Fasenra add-on therapy for her asthma but her voicemail is full. Will try sending my chart message

## 2021-06-03 NOTE — Telephone Encounter (Signed)
-----   Message from Valentina Shaggy, MD sent at 06/02/2021  1:00 PM EDT ----- Likely Berna Bue start. AEC 400

## 2021-06-15 NOTE — Telephone Encounter (Signed)
Tried to reach patient by phone but voicemail full and have not had response to [employer]. If patient comes in for followup and wants to discuss Berna Bue she can reach out to me

## 2021-06-15 NOTE — Telephone Encounter (Signed)
Patient called back from missed call and we discussed Fasenra for add-on therapy for her asthma and she does want to start. I advised her I would start approval process and reach out to her when ready to submit. I did advise her to clear her voicemail so the pharmacy will be able to reach her

## 2021-06-16 NOTE — Telephone Encounter (Signed)
Great - thank you!   Mansfield Dann, MD [facility]   

## 2021-06-21 ENCOUNTER — Ambulatory Visit: Payer: 59 | Admitting: Internal Medicine

## 2021-06-21 NOTE — Telephone Encounter (Signed)
L/m for patient to contact me to advise non coverage (excluded benefits) will need to try and get same through denied savings program

## 2021-06-21 NOTE — Progress Notes (Deleted)
Amy Molina, female    DOB: Jul 22, 1961, 60 y.o.   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 asthma rx multiple rx > symbicort 160 worked the best in terms of doe but still freq freq flares and need for saba and prednisone so referred to pulmonary clinic in [place]  02/11/2021 by Dr    Benna Dunks      History of Present Illness  02/11/2021  Pulmonary/ 1st office eval/ Jashiya Bassett / [place] 160 2 qam with poor technique/ last pred in April 2022  Chief Complaint  Patient presents with   Consult    Shortness of breath with activity  Dyspnea:  Walks a mile twice daily at work but limited by R foot now  Cough: none now  Sleep: recliner low level  SABA use: none now Rec Plan A = Automatic = Always=    Symbicort 160 Take 2 puffs first thing in am and then another 2 puffs about 12 hours later.  Work on inhaler technique:    Plan B = Backup (to supplement plan A, not to replace it) Only use your albuterol inhaler  Plan C = Crisis (instead of Plan B but only if Plan B stops working) - only use your albuterol nebulizer if you first try Plan B and it fails to help > ok to use the nebulizer up to every 4 hours but if start needing it regularly call for immediate appointment Try albuterol 15 min before an activity (on alternating days)  that you know would make you short of breath and see if it makes any difference and if makes none then don't take albuterol after activity unless you can't catch your breath as this means it's the resting that helps, not the albuterol. Please remember to go to the lab department @ [facility] for your tests - we will call you with the results when they are available. Please schedule a follow up office visit in 6 weeks, call sooner if needed with PFTs first if  possible    06/21/2021  f/u ov/[place] office/Ryley Bachtel re: *** maint on ***  No chief complaint on file.   Dyspnea:  *** Cough: *** Sleeping: *** SABA use: *** 02: *** Covid status: *** Lung cancer screening: ***   No obvious day to day or daytime variability or assoc excess/ purulent sputum or mucus plugs or hemoptysis or cp or chest tightness, subjective wheeze or overt sinus or hb symptoms.   *** without nocturnal  or early am exacerbation  of respiratory  c/o's or need for noct saba. Also denies any obvious fluctuation of symptoms with weather or environmental changes or other aggravating or alleviating factors except as outlined above   No unusual exposure hx or h/o childhood pna/ asthma or knowledge of premature birth.  Current Allergies, Complete Past Medical History, Past Surgical History, Family History, and Social History were reviewed in [employer] record.  ROS  The following are not active complaints unless bolded Hoarseness, sore throat, dysphagia, dental problems, itching, sneezing,  nasal congestion or discharge of excess mucus or purulent secretions, ear ache,   fever, chills, sweats, unintended wt loss or wt gain, classically pleuritic or exertional cp,  orthopnea pnd or arm/hand swelling  or leg swelling, presyncope,  palpitations, abdominal pain, anorexia, nausea, vomiting, diarrhea  or change in bowel habits or change in bladder habits, change in stools or change in urine, dysuria, hematuria,  rash, arthralgias, visual complaints, headache, numbness, weakness or ataxia or problems with walking or coordination,  change in mood or  memory.        No outpatient medications have been marked as taking for the 06/21/21 encounter (Appointment) with Tanda Rockers, MD.          Past Medical History:  Diagnosis Date   Anxiety    Arthritis    general stiffness of joints   Asthma    Complication of anesthesia    per pt,"hard to wake up past some  sedation!   Diabetes mellitus ([place])    Diarrhea    Heart murmur    childhood years- no mention in adult yrs   History of indigestion    Hypertension    Morbid obesity (HCC)    Seasonal allergies    Spinal stenosis        Objective:     Wt Readings from Last 3 Encounters:  06/02/21 (!) 322 lb 9.6 oz (146.3 kg)  02/11/21 (!) 321 lb (145.6 kg)  01/08/21 300 lb (136.1 kg)      Vital signs reviewed  06/21/2021  - Note at rest 02 sats  ***% on ***   General appearance:    ***   R foot boot  ***         Assessment

## 2021-06-24 NOTE — Telephone Encounter (Signed)
Patient does have coverage for the autoinjector but in order to get approval she has to have eos within the last 6 weeks- already l/m for patient to contact me

## 2021-07-01 NOTE — Telephone Encounter (Signed)
L/m for patient to contact me  

## 2021-07-06 NOTE — Telephone Encounter (Signed)
I called Amy Molina and she was walking into work.  She sounded rather winded.  I talked to her again about starting Amy Molina and she is very interested.  She is coming to see [place] November 2.  I believe she is on [place] schedule.  She would like to get her first dose that day.  I did confirm that the [phone] area code number was the best number to contact her.  Amy Marvel, MD [facility] [place] [facility] [place]

## 2021-07-06 NOTE — Telephone Encounter (Signed)
Patient never returned calls again and previously sent mychart message she did not respond to.  If she decides she wants to proceed with Berna Bue we will need CBC within 6 wks of approval submit and she will need to answer calls from me and pharmacy

## 2021-07-13 NOTE — Patient Instructions (Incomplete)
1. 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 We will call you with results once they are back. - Daily controller medication(s): Symbicort 160/4.34mcg two puffs twice daily with spacer - Prior to physical activity: albuterol 2 puffs 10-15 minutes before physical activity. - Rescue medications: albuterol 4 puffs every 4-6 hours as needed - Asthma control goals:  * Full participation in all desired activities (may need albuterol before activity) * Albuterol use two time or less a week on average (not counting use with activity) * Cough interfering with sleep two time or less a month * Oral steroids no more than once a year * No hospitalizations  2. Seasonal and perennial allergic rhinitis (grasses, trees, indoor molds, outdoor molds, dust mites, cat, dog, and cockroach)  - Continue taking: Nasacort (triamcinolone) one spray per nostril daily as needed on particularly bad days. - You can use an extra dose of the antihistamine, if needed, for breakthrough symptoms.  - Consider nasal saline rinses 1-2 times daily to remove allergens from the nasal cavities as well as help with mucous clearance (this is especially helpful to do before the nasal sprays are given) - Consider allergy shots as a means of long-term control. - Allergy shots "re-train" and "reset" the immune system to ignore environmental allergens and decrease the resulting immune response to those allergens (sneezing, itchy watery eyes, runny nose, nasal congestion, etc).    - Allergy shots improve symptoms in 75-85% of patients.   3. Rash - Continue triamcinolone ointment twice daily as needed. - Avoid using on the face,neck, groin, or arm pit region. You can use on your legs and back.   Schedule a follow up appointment in months

## 2021-07-14 ENCOUNTER — Ambulatory Visit: Payer: 59 | Admitting: Family

## 2021-07-14 NOTE — Patient Instructions (Addendum)
1. 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 Get this lab drawn next Friday since you have been on steroids We will call you with results once they are back. - Daily controller medication(s):  Increase Symbicort 160/4.32mcg two puffs twice daily with spacer. Spacer given with demonstration - Prior to physical activity: albuterol 2 puffs 10-15 minutes before physical activity. - Rescue medications: albuterol 4 puffs every 4-6 hours as needed - Asthma control goals:  * Full participation in all desired activities (may need albuterol before activity) * Albuterol use two time or less a week on average (not counting use with activity) * Cough interfering with sleep two time or less a month * Oral steroids no more than once a year * No hospitalizations  2. Seasonal and perennial allergic rhinitis (grasses, trees, indoor molds, outdoor molds, dust mites, cat, dog, and cockroach)  - Continue taking: Claritin 10 mg once a day Nasacort (triamcinolone) one spray per nostril daily as needed on particularly bad days. - You can use an extra dose of the antihistamine, if needed, for breakthrough symptoms.  - Consider nasal saline rinses 1-2 times daily to remove allergens from the nasal cavities as well as help with mucous clearance (this is especially helpful to do before the nasal sprays are given) - Consider allergy shots as a means of long-term control. - Allergy shots "re-train" and "reset" the immune system to ignore environmental allergens and decrease the resulting immune response to those allergens (sneezing, itchy watery eyes, runny nose, nasal congestion, etc).    - Allergy shots improve symptoms in 75-85% of patients.   3. Rash - Continue triamcinolone ointment twice daily as needed. - Avoid using on the face,neck, groin, or arm pit region. You can use on your legs and back.   -Continue treatment plan as per your primary care physician  Your blood pressure is elevated at today's office visit. Please schedule an appointment with your primary care physician to discuss.  Schedule a follow up appointment in 2 months or sooner if needed

## 2021-07-16 ENCOUNTER — Other Ambulatory Visit: Payer: Self-pay

## 2021-07-16 ENCOUNTER — Ambulatory Visit: Payer: 59 | Admitting: Family

## 2021-07-16 ENCOUNTER — Encounter: Payer: Self-pay | Admitting: Family

## 2021-07-16 VITALS — BP 140/80 | HR 76 | Temp 97.7°F | Resp 18 | Ht 65.0 in | Wt 319.0 lb

## 2021-07-16 DIAGNOSIS — J4540 Moderate persistent asthma, uncomplicated: Secondary | ICD-10-CM

## 2021-07-16 DIAGNOSIS — J302 Other seasonal allergic rhinitis: Secondary | ICD-10-CM | POA: Diagnosis not present

## 2021-07-16 DIAGNOSIS — R21 Rash and other nonspecific skin eruption: Secondary | ICD-10-CM

## 2021-07-16 DIAGNOSIS — J3089 Other allergic rhinitis: Secondary | ICD-10-CM

## 2021-07-16 MED ORDER — TRIAMCINOLONE ACETONIDE 55 MCG/ACT NA AERO: INHALATION_SPRAY | NASAL | 5 refills | Status: DC

## 2021-07-16 NOTE — Progress Notes (Signed)
[address] [place] [address] Dept: [phone]  FOLLOW UP NOTE  Patient ID: Amy Molina, female    DOB: 10/04/1960  Age: 60 y.o. MRN: [id number] Date of Office Visit: 07/16/2021  Assessment  Chief Complaint: Asthma  HPI Amy Molina is a 60 year old female who presents today for follow-up of moderate persistent asthma with a AEC of 400, perennial and seasonal allergic rhinitis, and rash.  She was last seen on June 02, 2021 by Dr. Ernst Bowler.  Since her last office visit she reports that she was diagnosed with cellulitis in both of her legs and was given an antibiotic, a cream that she does not know the name of, and prednisone.  She finished the prednisone approximately 7 to 10 days ago.  Moderate persistent asthma is reported as not well controlled with Symbicort 160/4.5 mcg 2 puffs once a day without a spacer and albuterol as needed.  She did not realize that she is supposed to be using Symbicort 2 puffs twice a day.  When she went to the pharmacy they did not have a spacer for her.  She reports wheezing and shortness of breath.  She feels that this is due to weather changes.  She denies coughing, tightness in her chest, and nocturnal awakenings due to breathing problems.  Since her last office visit she has not made any trips to the emergency room or urgent care due to breathing problems.  She has been on 1 round of steroids for the cellulitis in her legs.  She uses her albuterol approximately 5 times a week.  Seasonal and perennial allergic rhinitis is reported as controlled with Claritin 10 mg once a day.  She does not have Nasacort nasal spray.  She denies rhinorrhea, nasal congestion, and postnasal drip.  She has not had any sinus infections since we last saw her.  She reports that she normally has a sinus infection this time of the year.  Her rash and itching of both her legs is reported as doing a whole lot better since she was changed to Claritin 10 mg  once a day and some cream for which the name is unknown.  She mentions that she was treated for cellulitis in both of her legs by her primary care physician.  She was given an antibiotic, steroids, and a cream that she does not know the name of.   Drug Allergies:  Allergies  Allergen Reactions   Penicillins Anaphylaxis, Shortness Of Breath and Swelling    DID THE REACTION INVOLVE: Swelling of the face/tongue/throat, SOB, or low BP? Yes Sudden or severe rash/hives, skin peeling, or the inside of the mouth or nose? No Did it require medical treatment? Yes When did it last happen?      15+ years If all above answers are "NO", may proceed with cephalosporin use.  PATIENT TOLERATED ZOSYN AND MERREM ON 07/21/17 ADMISSION    Floxin [Ofloxacin]     MIGRAINES, INSOMNIA, PARANOIND, RINGING IN EARS   Sulfa Antibiotics Hives   Lipitor [Atorvastatin]     Severe myalgias   Penicillin G Benzathine Rash    Review of Systems: Review of Systems  Constitutional:  Positive for chills. Negative for fever.       Reports chills at times, but denies fever  HENT:         Denies rhinorrhea, nasal congestion, and postnasal drip  Eyes:        Denies itchy watery eyes  Respiratory:  Positive for shortness  of breath and wheezing. Negative for cough.        Reports wheezing and shortness of breath.  Denies coughing, tightness in her chest, and nocturnal awakenings due to breathing problems.  Cardiovascular:  Negative for chest pain and palpitations.  Genitourinary:  Positive for frequency.       Reports frequency of urination even on days she does not take her fluid pill.  She reports that she has discussed this with her primary care physician  Skin:  Positive for itching.       Reports the itching of her legs is a whole lot better since her last appointment  Neurological:  Negative for headaches.  Endo/Heme/Allergies:  Positive for environmental allergies.    Physical Exam: BP 140/80 (BP Location: Left  Arm, Patient Position: Sitting, Cuff Size: Large)   Pulse 76   Temp 97.7 F (36.5 C) (Temporal)   Resp 18   Ht 5\' 5"  (1.651 m)   Wt (!) 319 lb (144.7 kg)   LMP 08/09/2016   SpO2 96%   BMI 53.08 kg/m    Physical Exam Constitutional:      Appearance: Normal appearance.  HENT:     Head: Normocephalic and atraumatic.     Comments: Pharynx normal, eyes normal, ears normal, nose normal    Right Ear: Tympanic membrane, ear canal and external ear normal.     Left Ear: Tympanic membrane, ear canal and external ear normal.     Nose: Nose normal.     Mouth/Throat:     Mouth: Mucous membranes are moist.     Pharynx: Oropharynx is clear.  Eyes:     Conjunctiva/sclera: Conjunctivae normal.  Cardiovascular:     Rate and Rhythm: Regular rhythm.     Heart sounds: Normal heart sounds.  Pulmonary:     Effort: Pulmonary effort is normal.     Breath sounds: Normal breath sounds.     Comments: Lungs clear to auscultation Musculoskeletal:     Cervical back: Neck supple.  Skin:    General: Skin is warm.  Neurological:     Mental Status: She is alert and oriented to person, place, and time.  Psychiatric:        Mood and Affect: Mood normal.        Behavior: Behavior normal.        Thought Content: Thought content normal.        Judgment: Judgment normal.    Diagnostics: FVC 1.82 L, FEV1 1.33 L (58%).  Predicted FVC 2.87 L, predicted FEV1 2.28 L.  Spirometry indicates possible moderately severe restriction.  Assessment and Plan: 1. Not well controlled moderate persistent asthma   2. Seasonal and perennial allergic rhinitis   3. Rash     Meds ordered this encounter  Medications   triamcinolone (NASACORT) 55 MCG/ACT AERO nasal inhaler    Sig: Place 2 sprays in each nostril once a day as needed for stuffy nose    Dispense:  1 each    Refill:  5     Patient Instructions  1. Moderate persistent asthma, uncomplicated - Consider adding Fasenra for control of your eosinophilic  asthma.We will get a cbc with diff to help qualify you. Get this lab drawn next Friday since you have been on steroids We will call you with results once they are back. - Daily controller medication(s):  Increase Symbicort 160/4.89mcg two puffs twice daily with spacer. Spacer given with demonstration - Prior to physical activity: albuterol 2 puffs 10-15 minutes  before physical activity. - Rescue medications: albuterol 4 puffs every 4-6 hours as needed - Asthma control goals:  * Full participation in all desired activities (may need albuterol before activity) * Albuterol use two time or less a week on average (not counting use with activity) * Cough interfering with sleep two time or less a month * Oral steroids no more than once a year * No hospitalizations  2. Seasonal and perennial allergic rhinitis (grasses, trees, indoor molds, outdoor molds, dust mites, cat, dog, and cockroach)  - Continue taking: Claritin 10 mg once a day Nasacort (triamcinolone) one spray per nostril daily as needed on particularly bad days. - You can use an extra dose of the antihistamine, if needed, for breakthrough symptoms.  - Consider nasal saline rinses 1-2 times daily to remove allergens from the nasal cavities as well as help with mucous clearance (this is especially helpful to do before the nasal sprays are given) - Consider allergy shots as a means of long-term control. - Allergy shots "re-train" and "reset" the immune system to ignore environmental allergens and decrease the resulting immune response to those allergens (sneezing, itchy watery eyes, runny nose, nasal congestion, etc).    - Allergy shots improve symptoms in 75-85% of patients.   3. Rash - Continue triamcinolone ointment twice daily as needed. - Avoid using on the face,neck, groin, or arm pit region. You can use on your legs and back.  -Continue treatment plan as per your primary care physician  Your blood pressure is elevated at today's office  visit. Please schedule an appointment with your primary care physician to discuss.  Schedule a follow up appointment in 2 months or sooner if needed   Return in about 2 months (around 09/15/2021), or if symptoms worsen or fail to improve.    Thank you for the opportunity to care for this patient.  Please do not hesitate to contact me with questions.  Althea Charon, FNP [facility] [place] [facility] [place]

## 2021-07-16 NOTE — Telephone Encounter (Signed)
Cbc with diff ordered. Instructed Amy Molina not to get completed until a week from today due to being on prednisone approximately 7-10 days ago for cellulitis.

## 2021-07-27 ENCOUNTER — Telehealth: Payer: Self-pay | Admitting: Family

## 2021-07-27 NOTE — Telephone Encounter (Signed)
Thank you :)

## 2021-07-27 NOTE — Telephone Encounter (Signed)
Called and reminded patient to get her CBC with Diff completed she stated she's going today to have it drawn.  Adalyn [phone]

## 2021-07-27 NOTE — Telephone Encounter (Signed)
Please call Rivka and remind her to get her CBC with diff completed to get her qualified for [facility]  Thank you, Althea Charon, FNP

## 2021-08-10 LAB — CBC WITH DIFFERENTIAL
Basophils Absolute: 0.1 10*3/uL (ref 0.0–0.2)
Basos: 1 %
EOS (ABSOLUTE): 0.6 10*3/uL — ABNORMAL HIGH (ref 0.0–0.4)
Eos: 5 %
Hematocrit: 41.8 % (ref 34.0–46.6)
Hemoglobin: 14 g/dL (ref 11.1–15.9)
Immature Grans (Abs): 0 10*3/uL (ref 0.0–0.1)
Immature Granulocytes: 0 %
Lymphocytes Absolute: 3.3 10*3/uL — ABNORMAL HIGH (ref 0.7–3.1)
Lymphs: 27 %
MCH: 30.7 pg (ref 26.6–33.0)
MCHC: 33.5 g/dL (ref 31.5–35.7)
MCV: 92 fL (ref 79–97)
Monocytes Absolute: 1.2 10*3/uL — ABNORMAL HIGH (ref 0.1–0.9)
Monocytes: 9 %
Neutrophils Absolute: 7.3 10*3/uL — ABNORMAL HIGH (ref 1.4–7.0)
Neutrophils: 58 %
RBC: 4.56 x10E6/uL (ref 3.77–5.28)
RDW: 11.7 % (ref 11.7–15.4)
WBC: 12.6 10*3/uL — ABNORMAL HIGH (ref 3.4–10.8)

## 2021-08-11 NOTE — Progress Notes (Signed)
Please let Amy Molina know that her cbc with diff came back and her eosinophils were elevated enough to qualify her for Berna Bue - a biologic drug to help with her asthma. Is she still interested in starting [place]?  Also, her white blood count is elevated. How is she feeling?

## 2021-08-16 NOTE — Progress Notes (Signed)
Thank you :)

## 2021-09-14 ENCOUNTER — Other Ambulatory Visit: Payer: Self-pay | Admitting: *Deleted

## 2021-09-14 MED ORDER — FASENRA PEN 30 MG/ML ~~LOC~~ SOAJ: 30.0000 mg | SUBCUTANEOUS | 9 refills | Status: DC

## 2021-09-27 ENCOUNTER — Ambulatory Visit (INDEPENDENT_AMBULATORY_CARE_PROVIDER_SITE_OTHER): Payer: 59

## 2021-09-27 ENCOUNTER — Other Ambulatory Visit: Payer: Self-pay

## 2021-09-27 DIAGNOSIS — J4550 Severe persistent asthma, uncomplicated: Secondary | ICD-10-CM | POA: Diagnosis not present

## 2021-09-27 MED ORDER — BENRALIZUMAB 30 MG/ML ~~LOC~~ SOSY
30.0000 mg | PREFILLED_SYRINGE | SUBCUTANEOUS | Status: AC
  Administered 2021-09-27 – 2024-09-18 (×20): 30 mg via SUBCUTANEOUS

## 2021-09-27 NOTE — Progress Notes (Signed)
Patient came in today and started her Fasenra injections. Patient tolerated her injections well. She previously signed consent. She waited in the lobby for thirty minutes and had no issues. She was scheduled for her next appointment.

## 2021-09-30 ENCOUNTER — Other Ambulatory Visit (HOSPITAL_COMMUNITY): Payer: Self-pay | Admitting: *Deleted

## 2021-09-30 DIAGNOSIS — Z1231 Encounter for screening mammogram for malignant neoplasm of breast: Secondary | ICD-10-CM

## 2021-10-06 ENCOUNTER — Ambulatory Visit (HOSPITAL_COMMUNITY): Payer: 59

## 2021-10-07 ENCOUNTER — Inpatient Hospital Stay
Admission: RE | Admit: 2021-10-07 | Discharge: 2021-10-07 | Disposition: A | Discharge status: Home / Self Care | Payer: Self-pay | Source: Ambulatory Visit | Attending: *Deleted | Admitting: *Deleted

## 2021-10-07 ENCOUNTER — Other Ambulatory Visit (HOSPITAL_COMMUNITY): Payer: Self-pay | Admitting: *Deleted

## 2021-10-07 DIAGNOSIS — Z1231 Encounter for screening mammogram for malignant neoplasm of breast: Secondary | ICD-10-CM

## 2021-10-25 ENCOUNTER — Other Ambulatory Visit: Payer: Self-pay

## 2021-10-25 ENCOUNTER — Ambulatory Visit (INDEPENDENT_AMBULATORY_CARE_PROVIDER_SITE_OTHER): Payer: 59

## 2021-10-25 DIAGNOSIS — J4550 Severe persistent asthma, uncomplicated: Secondary | ICD-10-CM

## 2021-11-08 ENCOUNTER — Telehealth: Payer: Self-pay

## 2021-11-08 NOTE — Telephone Encounter (Signed)
Called and spoke with the patient and advised that it can take more time for the [place] to start to work long term. I did ask if she was still taking her Symbicort and she stated no that since she felt good she stopped, I did advised to add back in the Symbicort with the Berna Bue and we will give it more time to help. Patient verbalized understanding and will add that one.

## 2021-11-08 NOTE — Telephone Encounter (Signed)
Patient called stating she felt better after her first [place]. She states the 2nd one did not help as she feels like she took a placebo. Patient states she is very short of breath when she gets up to walk. She states after the 1st one she noticed she wasn't short of breath and able to move around more.   Please advise.

## 2021-11-26 ENCOUNTER — Other Ambulatory Visit: Payer: Self-pay

## 2021-11-26 ENCOUNTER — Ambulatory Visit (INDEPENDENT_AMBULATORY_CARE_PROVIDER_SITE_OTHER): Payer: 59

## 2021-11-26 DIAGNOSIS — J4550 Severe persistent asthma, uncomplicated: Secondary | ICD-10-CM | POA: Diagnosis not present

## 2022-01-12 ENCOUNTER — Other Ambulatory Visit: Payer: Self-pay | Admitting: Allergy & Immunology

## 2022-01-14 ENCOUNTER — Other Ambulatory Visit: Payer: Self-pay

## 2022-01-14 ENCOUNTER — Emergency Department (HOSPITAL_BASED_OUTPATIENT_CLINIC_OR_DEPARTMENT_OTHER): Payer: 59 | Admitting: Radiology

## 2022-01-14 ENCOUNTER — Encounter (HOSPITAL_BASED_OUTPATIENT_CLINIC_OR_DEPARTMENT_OTHER): Payer: Self-pay

## 2022-01-14 ENCOUNTER — Emergency Department (HOSPITAL_BASED_OUTPATIENT_CLINIC_OR_DEPARTMENT_OTHER)
Admission: EM | Admit: 2022-01-14 | Discharge: 2022-01-14 | Disposition: A | Discharge status: Home / Self Care | Payer: 59 | Attending: Emergency Medicine | Admitting: Emergency Medicine

## 2022-01-14 DIAGNOSIS — M1711 Unilateral primary osteoarthritis, right knee: Secondary | ICD-10-CM

## 2022-01-14 DIAGNOSIS — M25561 Pain in right knee: Secondary | ICD-10-CM | POA: Insufficient documentation

## 2022-01-14 DIAGNOSIS — Z7982 Long term (current) use of aspirin: Secondary | ICD-10-CM | POA: Insufficient documentation

## 2022-01-14 MED ORDER — OXYCODONE-ACETAMINOPHEN 5-325 MG PO TABS: 1.0000 | ORAL_TABLET | Freq: Four times a day (QID) | ORAL | 0 refills | Status: DC | PRN

## 2022-01-14 MED ORDER — PREDNISONE 50 MG PO TABS: 50.0000 mg | ORAL_TABLET | Freq: Every day | ORAL | 0 refills | Status: DC

## 2022-01-14 NOTE — ED Triage Notes (Signed)
Pt. States right knee pain started 12/29/2021. Pt. Denies injury to knee. Pain at 7/10 . From knee down to right ankle. States knee is swollen. Pt. States has been taking brothers oxycodone for pain. ?

## 2022-01-14 NOTE — ED Provider Notes (Signed)
?[facility] ?Provider Note ? ? ?CSN: [id number] ?Arrival date & time: 01/14/22  0856 ? ?  ? ?History ? ?Chief Complaint  ?Patient presents with  ? Knee Pain  ?  Right Knee   ? ? ?Amy Molina is a 61 y.o. female. ? ?61 year old female presents with 3 weeks of right knee pain that began after she also removed.  States the pain has been sharp and worse with movement.  No fever or chills.  Pain does go down to her right foot.  No hip discomfort with this.  Has used Tylenol with limited relief.  Did take an oxycodone yesterday with slight relief.  No prior history of knee injury ? ? ?  ? ?Home Medications ?Prior to Admission medications   ?Medication Sig Start Date End Date Taking? Authorizing Provider  ?acetaminophen (TYLENOL) 500 MG tablet Take 1,000 mg by mouth 2 (two) times daily as needed for moderate pain.    [provider]  ?albuterol (PROVENTIL HFA;VENTOLIN HFA) 108 (90 Base) MCG/ACT inhaler Inhale 1-2 puffs into the lungs every 6 (six) hours as needed for wheezing or shortness of breath. ?Patient taking differently: Inhale 2 puffs into the lungs every 6 (six) hours as needed for wheezing or shortness of breath. 12/12/17   Varney Biles, MD  ?albuterol (PROVENTIL) (2.5 MG/3ML) 0.083% nebulizer solution Take 2.5 mg by nebulization every 6 (six) hours as needed for wheezing or shortness of breath.    [provider]  ?ALPRAZolam Duanne Moron) 0.5 MG tablet Take 1 tablet (0.5 mg total) by mouth 3 (three) times daily as needed for sleep or anxiety. ?Patient taking differently: Take 0.5 mg by mouth at bedtime as needed for anxiety or sleep. 12/26/17   Florian Buff, MD  ?aspirin 81 MG tablet Take 81 mg by mouth at bedtime.    [provider]  ?Benralizumab (FASENRA PEN) 30 MG/ML SOAJ Inject 1 mL (30 mg total) into the skin every 28 (twenty-eight) days. FOR 3 DOSES THEN EVERY 8 WEEKS 09/14/21   Althea Charon, FNP  ?escitalopram (LEXAPRO) 20 MG tablet Take 1 tablet by  mouth once daily 11/29/20   Florian Buff, MD  ?furosemide (LASIX) 20 MG tablet Take 20 mg by mouth daily. 04/29/20   [provider]  ?loperamide (IMODIUM A-D) 2 MG tablet Take 2 mg by mouth as needed for diarrhea or loose stools.    [provider]  ?loratadine (CLARITIN) 10 MG tablet Take 10 mg by mouth daily.    [provider]  ?losartan (COZAAR) 50 MG tablet Take 1 tablet (50 mg total) by mouth daily. ?Patient taking differently: Take 25 mg by mouth daily. 11/27/13   Susy Frizzle, MD  ?metFORMIN (GLUCOPHAGE) 1000 MG tablet Take 1 tablet (1,000 mg total) by mouth daily with breakfast. 11/27/13   Susy Frizzle, MD  ?omeprazole (PRILOSEC) 20 MG capsule Take 20 mg by mouth daily.    [provider]  ?Spacer/Aero-Holding Chambers (AEROCHAMBER PLUS FLO-VU LARGE) MISC Use as directed with metered dose inhaler. 06/02/21   Valentina Shaggy, MD  ?[facility] 160-4.5 MCG/ACT inhaler Inhale 2 puffs into the lungs daily.  12/15/17   [provider]  ?SYMBICORT 160-4.5 MCG/ACT inhaler Inhale 2 puffs into the lungs in the morning and at bedtime. 06/02/21   Valentina Shaggy, MD  ?Tetrahydrozoline HCl (VISINE OP) Place 1 drop into both eyes daily as needed (dry eyes).    [provider]  ?triamcinolone (NASACORT) 55 MCG/ACT  AERO nasal inhaler Place 2 sprays in each nostril once a day as needed for stuffy nose 07/16/21   Althea Charon, FNP  ?triamcinolone ointment (KENALOG) 0.5 % Apply 1 application topically 2 (two) times daily as needed. 06/02/21   Valentina Shaggy, MD  ?vitamin B-12 (CYANOCOBALAMIN) 500 MCG tablet Take 500 mcg by mouth daily.    [provider]  ?   ? ?Allergies    ?Penicillins, Floxin [ofloxacin], Sulfa antibiotics, Lipitor [atorvastatin], and Penicillin g benzathine   ? ?Review of Systems   ?Review of Systems  ?All other systems reviewed and are negative. ? ?Physical Exam ?Updated Vital Signs ?BP (!) 152/78 (BP Location: Right  Arm)   Pulse 74   Temp 97.9 ?F (36.6 ?C) (Oral)   Resp 19   Ht 1.651 m ('5\' 5"'$ )   Wt (!) 142 kg   LMP 08/09/2016   SpO2 97%   BMI 52.09 kg/m?  ?Physical Exam ?Vitals and nursing note reviewed.  ?Constitutional:   ?   General: She is not in acute distress. ?   Appearance: Normal appearance. She is well-developed. She is not toxic-appearing.  ?HENT:  ?   Head: Normocephalic and atraumatic.  ?Eyes:  ?   General: Lids are normal.  ?   Conjunctiva/sclera: Conjunctivae normal.  ?   Pupils: Pupils are equal, round, and reactive to light.  ?Neck:  ?   Thyroid: No thyroid mass.  ?   Trachea: No tracheal deviation.  ?Cardiovascular:  ?   Rate and Rhythm: Normal rate and regular rhythm.  ?   Heart sounds: Normal heart sounds. No murmur heard. ?  No gallop.  ?Pulmonary:  ?   Effort: Pulmonary effort is normal. No respiratory distress.  ?   Breath sounds: Normal breath sounds. No stridor. No decreased breath sounds, wheezing, rhonchi or rales.  ?Abdominal:  ?   General: There is no distension.  ?   Palpations: Abdomen is soft.  ?   Tenderness: There is no abdominal tenderness. There is no rebound.  ?Musculoskeletal:     ?   General: No tenderness. Normal range of motion.  ?   Cervical back: Normal range of motion and neck supple.  ?   Right knee: Bony tenderness present. No swelling, effusion or erythema. Normal range of motion.  ?Skin: ?   General: Skin is warm and dry.  ?   Findings: No abrasion or rash.  ?Neurological:  ?   Mental Status: She is alert and oriented to person, place, and time. Mental status is at baseline.  ?   GCS: GCS eye subscore is 4. GCS verbal subscore is 5. GCS motor subscore is 6.  ?   Cranial Nerves: No cranial nerve deficit.  ?   Sensory: No sensory deficit.  ?   Motor: Motor function is intact.  ?Psychiatric:     ?   Attention and Perception: Attention normal.     ?   Speech: Speech normal.     ?   Behavior: Behavior normal.  ? ? ?ED Results / Procedures / Treatments   ?Labs ?(all labs ordered  are listed, but only abnormal results are displayed) ?Labs Reviewed - No data to display ? ?EKG ?None ? ?Radiology ?No results found. ? ?Procedures ?Procedures  ? ? ?Medications Ordered in ED ?Medications - No data to display ? ?ED Course/ Medical Decision Making/ A&P ?  ?                        ?  Medical Decision Making ?Amount and/or Complexity of Data Reviewed ?Radiology: ordered. ? ? ?X-ray of patient's right knee consistent with osteoarthritis per my review and interpretation.  Patient will be given referral to orthopedics and placed on short course of pain medication and steroid ? ? ? ? ? ? ? ?Final Clinical Impression(s) / ED Diagnoses ?Final diagnoses:  ?None  ? ? ?Rx / DC Orders ?ED Discharge Orders   ? ? None  ? ?  ? ? ?  ?Lacretia Leigh, MD ?01/14/22 1032 ? ?

## 2022-01-21 ENCOUNTER — Ambulatory Visit: Payer: 59

## 2022-01-21 ENCOUNTER — Other Ambulatory Visit: Payer: Self-pay | Admitting: Allergy & Immunology

## 2022-01-21 NOTE — Telephone Encounter (Signed)
Called and tried to contact patient to inform her that she needed to schedule an appointment for a follow up visit. Mailbox is full, will try again later.  ?

## 2022-01-31 ENCOUNTER — Ambulatory Visit (INDEPENDENT_AMBULATORY_CARE_PROVIDER_SITE_OTHER): Payer: 59

## 2022-01-31 DIAGNOSIS — J4550 Severe persistent asthma, uncomplicated: Secondary | ICD-10-CM | POA: Diagnosis not present

## 2022-03-14 ENCOUNTER — Other Ambulatory Visit: Payer: Self-pay | Admitting: Allergy & Immunology

## 2022-03-18 ENCOUNTER — Telehealth: Payer: Self-pay

## 2022-03-18 NOTE — Telephone Encounter (Signed)
Called patient - DOB verified - advised our office received an [place] regarding Fasenra. Patient stated after the 1st injection - 09/27/21 - she felt better, medication was helping, could tell a difference in her breathing. Patient stated after her 2nd injection - 10/25/21 - she felt like she had received a placebo, feeling very short of breath on exertion ( ie. getting up to walk). Patient stated she does feel a lot better now after having her 4th injection - 01/31/22 - she can definitely tell a change in her breathing.  Patient verbalized understanding for the purpose of the call - thanked her for her feedback, no further questions.

## 2022-03-31 NOTE — Progress Notes (Signed)
[address] [place] [address] Dept: [phone]  FOLLOW UP NOTE  Patient ID: Amy Molina, female    DOB: [birth year]  Age: 61 y.o. MRN: [id number] Date of Office Visit: 04/01/2022  Assessment  Chief Complaint: Asthma (Asthma is doing much much better. ), Allergic Rhinitis  (When it gets too too hot she notices she struggles to breathe with the humid. ), and Medication Refill (Neb. solution)  HPI Amy Molina is a 61 year old female who presents to the clinic for follow-up visit.  She was last seen in this clinic on 07/16/2021 by Amy Charon, FNP, for evaluation of asthma, allergic rhinitis, and atopic dermatitis.  At today's visit, she reports her asthma has been much more well controlled since her last visit to this clinic.  She reports occasional shortness of breath which is worsened by heat, humidity, and activity.  She continues Symbicort 162 puffs every morning and occasionally uses 2 puffs in the evening.  She reports infrequent use of albuterol with relief of symptoms when needed.  She continues Fasenra with no large or local reactions.  She reports a significant decrease in her asthma symptoms while continuing on Fasenra.  Allergic rhinitis is reported as much more well controlled since her last visit to this clinic.  She reports some postnasal drainage that occurred about 1 month ago and has resolved at this time.  She continues Claritin 10 mg once a day, Flonase occasionally, and saline nasal rinses occasionally.  She does report an intermittent headache occurring almost every morning and resolving after taking allergy medicines.  She reports that Flonase does help to relieve this headache.  Her last environmental allergy skin testing was on 06/02/2021 and was positive to grass pollen, tree pollen, mold, dust mite, cat, dog, and cockroach. Reflux is reported as well controlled with no symptoms including heartburn or vomiting [name] she continues omeprazole 20 mg once  a day.  She reports the rash on her legs continues to occur in a flare in remission pattern and she has recently seen a dermatologist for evaluation and treatment.  Her current medications are listed in the chart.    Drug Allergies:  Allergies  Allergen Reactions   Penicillins Anaphylaxis, Shortness Of Breath and Swelling    DID THE REACTION INVOLVE: Swelling of the face/tongue/throat, SOB, or low BP? Yes Sudden or severe rash/hives, skin peeling, or the inside of the mouth or nose? No Did it require medical treatment? Yes When did it last happen?      15+ years If all above answers are "NO", may proceed with cephalosporin use.  PATIENT TOLERATED ZOSYN AND MERREM ON 07/21/17 ADMISSION    Floxin [Ofloxacin]     MIGRAINES, INSOMNIA, PARANOIND, RINGING IN EARS   Sulfa Antibiotics Hives   Lipitor [Atorvastatin]     Severe myalgias   Penicillin G Benzathine Rash    Physical Exam: Temp 98.1 F (36.7 C)   Resp 16   Ht '5\' 5"'$  (1.651 m)   Wt (!) 313 lb (142 kg)   LMP 08/09/2016   BMI 52.09 kg/m    Physical Exam Vitals reviewed.  Constitutional:      Appearance: Normal appearance.  HENT:     Head: Normocephalic and atraumatic.     Right Ear: Tympanic membrane normal.     Left Ear: Tympanic membrane normal.     Nose:     Comments: Bilateral nares slightly erythematous with clear nasal drainage noted.  Pharynx normal.  Ears normal.  Eyes normal.    Mouth/Throat:     Pharynx: Oropharynx is clear.  Eyes:     Conjunctiva/sclera: Conjunctivae normal.  Cardiovascular:     Rate and Rhythm: Normal rate and regular rhythm.     Heart sounds: Normal heart sounds. No murmur heard. Pulmonary:     Effort: Pulmonary effort is normal.     Breath sounds: Normal breath sounds.     Comments: Lungs clear to auscultation Musculoskeletal:        General: Normal range of motion.     Cervical back: Normal range of motion and neck supple.  Skin:    General: Skin is warm and dry.     Comments:  Bilateral lower extremities with linear hyperpigmented papules and scattered dry scaling patches  Neurological:     Mental Status: She is alert and oriented to person, place, and time.  Psychiatric:        Mood and Affect: Mood normal.        Behavior: Behavior normal.        Thought Content: Thought content normal.        Judgment: Judgment normal.     Diagnostics: FVC 1.79, FEV1 1.55.  Predicted FVC 2.85, predicted FEV1 2.26.  Spirometry indicates possible restriction.  This is consistent with previous spirometry readings.  Assessment and Plan: 1. Severe persistent asthma without complication   2. Seasonal and perennial allergic rhinitis   3. Gastroesophageal reflux disease, unspecified whether esophagitis present   4. Rash     Meds ordered this encounter  Medications   triamcinolone (NASACORT) 55 MCG/ACT AERO nasal inhaler    Sig: Place 2 sprays in each nostril once a day as needed for stuffy nose    Dispense:  1 each    Refill:  5    Patient Instructions  Asthma Continue Symbicort 160-2 puffs twice a day with a spacer to prevent cough or wheeze Continue albuterol 2 puffs once every 4 hours as needed for cough or wheeze You may use albuterol 2 puffs 5 to 15 minutes before activity to decrease cough or wheeze Continue Fasenra injections once every 2 months  Allergic rhinitis  Continue allergen avoidance measures directed toward grasses, trees, indoor molds, outdoor molds, dust mites, cat, dog, and cockroach as listed below Continue Claritin 10 mg once a day as needed for runny nose or itch Continue Flonase or Nasacort 2 sprays in each nostril once a day as needed for a stuffy nose.  In the right nostril, point the applicator out toward the right ear. In the left nostril, point the applicator out toward the left ear Consider saline nasal rinses as needed for nasal symptoms. Use this before any medicated nasal sprays for best result Consider allergen immunotherapy if your  symptoms are not well controlled with the treatment plan as listed above  Rash Continue treatment plan as per your dermatologist  Reflux Continue dietary and lifestyle modifications as listed below Continue omeprazole 20 mg once a day for control of reflux  Your blood pressure is elevated at today's office visit. Please schedule an appointment with your primary care physician to discuss.  Call the clinic if this treatment plan is not working well for you   Follow up in 6 months or sooner if needed.   Return in about 6 months (around 10/02/2022), or if symptoms worsen or fail to improve.    Thank you for the opportunity to care for this patient.  Please do not hesitate to contact me  with questions.  Gareth Morgan, FNP [facility]

## 2022-03-31 NOTE — Patient Instructions (Addendum)
Asthma Continue Symbicort 160-2 puffs twice a day with a spacer to prevent cough or wheeze Continue albuterol 2 puffs once every 4 hours as needed for cough or wheeze You may use albuterol 2 puffs 5 to 15 minutes before activity to decrease cough or wheeze Continue Fasenra injections once every 2 months  Allergic rhinitis  Continue allergen avoidance measures directed toward grasses, trees, indoor molds, outdoor molds, dust mites, cat, dog, and cockroach as listed below Continue Claritin 10 mg once a day as needed for runny nose or itch Continue Flonase or Nasacort 2 sprays in each nostril once a day as needed for a stuffy nose.  In the right nostril, point the applicator out toward the right ear. In the left nostril, point the applicator out toward the left ear Consider saline nasal rinses as needed for nasal symptoms. Use this before any medicated nasal sprays for best result Consider allergen immunotherapy if your symptoms are not well controlled with the treatment plan as listed above  Rash Continue treatment plan as per your dermatologist  Reflux Continue dietary and lifestyle modifications as listed below Continue omeprazole 20 mg once a day for control of reflux  Your blood pressure is elevated at today's office visit. Please schedule an appointment with your primary care physician to discuss.  Call the clinic if this treatment plan is not working well for you  Follow up in 6 months or sooner if needed.  Reducing Pollen Exposure The American Academy of Allergy, Asthma and Immunology suggests the following steps to reduce your exposure to pollen during allergy seasons. Do not hang sheets or clothing out to dry; pollen may collect on these items. Do not mow lawns or spend time around freshly cut grass; mowing stirs up pollen. Keep windows closed at night.  Keep car windows closed while driving. Minimize morning activities outdoors, a time when pollen counts are usually at their  highest. Stay indoors as much as possible when pollen counts or humidity is high and on windy days when pollen tends to remain in the air longer. Use air conditioning when possible.  Many air conditioners have filters that trap the pollen spores. Use a HEPA room air filter to remove pollen form the indoor air you breathe.  Control of Mold Allergen Mold and fungi can grow on a variety of surfaces provided certain temperature and moisture conditions exist.  Outdoor molds grow on plants, decaying vegetation and soil.  The major outdoor mold, Alternaria and Cladosporium, are found in very high numbers during hot and dry conditions.  Generally, a late Summer - Fall peak is seen for common outdoor fungal spores.  Rain will temporarily lower outdoor mold spore count, but counts rise rapidly when the rainy period ends.  The most important indoor molds are Aspergillus and Penicillium.  Dark, humid and poorly ventilated basements are ideal sites for mold growth.  The next most common sites of mold growth are the bathroom and the kitchen.  Outdoor [employer] Use air conditioning and keep windows closed Avoid exposure to decaying vegetation. Avoid leaf raking. Avoid grain handling. Consider wearing a face mask if working in moldy areas.  Indoor Mold Control Maintain humidity below 50%. Clean washable surfaces with 5% bleach solution. Remove sources e.g. Contaminated carpets.   Control of Dust Mite Allergen Dust mites play a major role in allergic asthma and rhinitis. They occur in environments with high humidity wherever human skin is found. Dust mites absorb humidity from the atmosphere (ie, they do  not drink) and feed on organic matter (including shed human and animal skin). Dust mites are a microscopic type of insect that you cannot see with the naked eye. High levels of dust mites have been detected from mattresses, pillows, carpets, upholstered furniture, bed covers, clothes, soft toys and any woven  material. The principal allergen of the dust mite is found in its feces. A gram of dust may contain 1,000 mites and 250,000 fecal particles. Mite antigen is easily measured in the air during house cleaning activities. Dust mites do not bite and do not cause harm to humans, other than by triggering allergies/asthma.  Ways to decrease your exposure to dust mites in your home:  1. Encase mattresses, box springs and pillows with a mite-impermeable barrier or cover  2. Wash sheets, blankets and drapes weekly in hot water (130 F) with detergent and dry them in a dryer on the hot setting.  3. Have the room cleaned frequently with a vacuum cleaner and a damp dust-mop. For carpeting or rugs, vacuuming with a vacuum cleaner equipped with a high-efficiency particulate air (HEPA) filter. The dust mite allergic individual should not be in a room which is being cleaned and should wait 1 hour after cleaning before going into the room.  4. Do not sleep on upholstered furniture (eg, couches).  5. If possible removing carpeting, upholstered furniture and drapery from the home is ideal. Horizontal blinds should be eliminated in the rooms where the person spends the most time (bedroom, study, television room). Washable vinyl, roller-type shades are optimal.  6. Remove all non-washable stuffed toys from the bedroom. Wash stuffed toys weekly like sheets and blankets above.  7. Reduce indoor humidity to less than 50%. Inexpensive humidity monitors can be purchased at most hardware stores. Do not use a humidifier as can make the problem worse and are not recommended.  Control of Dog or Cat Allergen Avoidance is the best way to manage a dog or cat allergy. If you have a dog or cat and are allergic to dog or cats, consider removing the dog or cat from the home. If you have a dog or cat but don't want to find it a new home, or if your family wants a pet even though someone in the household is allergic, here are some  strategies that may help keep symptoms at bay:  Keep the pet out of your bedroom and restrict it to only a few rooms. Be advised that keeping the dog or cat in only one room will not limit the allergens to that room. Don't pet, hug or kiss the dog or cat; if you do, wash your hands with soap and water. High-efficiency particulate air (HEPA) cleaners run continuously in a bedroom or living room can reduce allergen levels over time. Regular use of a high-efficiency vacuum cleaner or a central vacuum can reduce allergen levels. Giving your dog or cat a bath at least once a week can reduce airborne allergen.  Control of Cockroach Allergen Cockroach allergen has been identified as an important cause of acute attacks of asthma, especially in urban settings.  There are fifty-five species of cockroach that exist in the [place], however only three, the [place], [place] species produce allergen that can affect patients with Asthma.  Allergens can be obtained from fecal particles, egg casings and secretions from cockroaches.    Remove food sources. Reduce access to water. Seal access and entry points. Spray runways with 0.5-1% Diazinon or Chlorpyrifos Blow boric  acid power under stoves and refrigerator. Place bait stations (hydramethylnon) at feeding sites.   Lifestyle Changes for Controlling GERD When you have GERD, stomach acid feels as if it's backing up toward your mouth. Whether or not you take medication to control your GERD, your symptoms can often be improved with lifestyle changes.   Raise Your Head Reflux is more likely to strike when you're lying down flat, because stomach fluid can flow backward more easily. Raising the head of your bed 4-6 inches can help. To do this: Slide blocks or books under the legs at the head of your bed. Or, place a wedge under the mattress. Many foam stores can make a suitable wedge for you. The wedge should run from your waist to the top of  your head. Don't just prop your head on several pillows. This increases pressure on your stomach. It can make GERD worse.  Watch Your Eating Habits Certain foods may increase the acid in your stomach or relax the lower esophageal sphincter, making GERD more likely. It's best to avoid the following: Coffee, tea, and carbonated drinks (with and without caffeine) Fatty, fried, or spicy food Mint, chocolate, onions, and tomatoes Any other foods that seem to irritate your stomach or cause you pain  Relieve the Pressure Eat smaller meals, even if you have to eat more often. Don't lie down right after you eat. Wait a few hours for your stomach to empty. Avoid tight belts and tight-fitting clothes. Lose excess weight.  Tobacco and Alcohol Avoid smoking tobacco and drinking alcohol. They can make GERD symptoms worse.

## 2022-04-01 ENCOUNTER — Encounter: Payer: Self-pay | Admitting: Family Medicine

## 2022-04-01 ENCOUNTER — Ambulatory Visit: Payer: 59

## 2022-04-01 ENCOUNTER — Ambulatory Visit: Payer: 59 | Admitting: Family Medicine

## 2022-04-01 VITALS — BP 158/96 | Temp 98.1°F | Resp 16 | Ht 65.0 in | Wt 313.0 lb

## 2022-04-01 DIAGNOSIS — J302 Other seasonal allergic rhinitis: Secondary | ICD-10-CM

## 2022-04-01 DIAGNOSIS — J3089 Other allergic rhinitis: Secondary | ICD-10-CM

## 2022-04-01 DIAGNOSIS — K219 Gastro-esophageal reflux disease without esophagitis: Secondary | ICD-10-CM | POA: Diagnosis not present

## 2022-04-01 DIAGNOSIS — J4550 Severe persistent asthma, uncomplicated: Secondary | ICD-10-CM | POA: Insufficient documentation

## 2022-04-01 DIAGNOSIS — R21 Rash and other nonspecific skin eruption: Secondary | ICD-10-CM | POA: Diagnosis not present

## 2022-04-01 MED ORDER — SYMBICORT 160-4.5 MCG/ACT IN AERO: INHALATION_SPRAY | RESPIRATORY_TRACT | 0 refills | Status: DC

## 2022-04-01 MED ORDER — TRIAMCINOLONE ACETONIDE 55 MCG/ACT NA AERO: INHALATION_SPRAY | NASAL | 5 refills | Status: DC

## 2022-04-01 MED ORDER — ALBUTEROL SULFATE HFA 108 (90 BASE) MCG/ACT IN AERS: 2.0000 | INHALATION_SPRAY | RESPIRATORY_TRACT | 1 refills | Status: DC | PRN

## 2022-04-01 NOTE — Addendum Note (Signed)
Addended by: Herbie Drape on: 04/01/2022 04:47 PM   Modules accepted: Orders

## 2022-05-22 ENCOUNTER — Other Ambulatory Visit: Payer: Self-pay | Admitting: Family Medicine

## 2022-05-27 ENCOUNTER — Ambulatory Visit (INDEPENDENT_AMBULATORY_CARE_PROVIDER_SITE_OTHER): Payer: 59

## 2022-05-27 DIAGNOSIS — J4550 Severe persistent asthma, uncomplicated: Secondary | ICD-10-CM

## 2022-07-20 ENCOUNTER — Ambulatory Visit: Payer: 59

## 2022-07-27 ENCOUNTER — Ambulatory Visit (INDEPENDENT_AMBULATORY_CARE_PROVIDER_SITE_OTHER): Payer: 59

## 2022-07-27 DIAGNOSIS — J4550 Severe persistent asthma, uncomplicated: Secondary | ICD-10-CM

## 2022-08-15 DIAGNOSIS — D361 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, unspecified: Secondary | ICD-10-CM | POA: Insufficient documentation

## 2022-08-15 DIAGNOSIS — I872 Venous insufficiency (chronic) (peripheral): Secondary | ICD-10-CM | POA: Insufficient documentation

## 2022-09-19 ENCOUNTER — Ambulatory Visit (INDEPENDENT_AMBULATORY_CARE_PROVIDER_SITE_OTHER): Payer: 59

## 2022-09-19 DIAGNOSIS — J4550 Severe persistent asthma, uncomplicated: Secondary | ICD-10-CM

## 2022-10-05 ENCOUNTER — Encounter: Payer: Self-pay | Admitting: Allergy & Immunology

## 2022-10-05 ENCOUNTER — Ambulatory Visit: Payer: 59 | Admitting: Allergy & Immunology

## 2022-10-05 VITALS — BP 154/90 | HR 93 | Temp 98.7°F | Resp 16 | Ht 65.0 in | Wt 317.0 lb

## 2022-10-05 DIAGNOSIS — K219 Gastro-esophageal reflux disease without esophagitis: Secondary | ICD-10-CM | POA: Diagnosis not present

## 2022-10-05 DIAGNOSIS — J302 Other seasonal allergic rhinitis: Secondary | ICD-10-CM

## 2022-10-05 DIAGNOSIS — J3089 Other allergic rhinitis: Secondary | ICD-10-CM

## 2022-10-05 DIAGNOSIS — J4550 Severe persistent asthma, uncomplicated: Secondary | ICD-10-CM

## 2022-10-05 MED ORDER — BREZTRI AEROSPHERE 160-9-4.8 MCG/ACT IN AERO: 2.0000 | INHALATION_SPRAY | Freq: Two times a day (BID) | RESPIRATORY_TRACT | 5 refills | Status: AC

## 2022-10-05 NOTE — Progress Notes (Signed)
FOLLOW UP  Date of Service/Encounter:  10/05/22   Assessment:   Moderate persistent asthma, uncomplicated - with AEC 588, doing well symptomatically on Fasenra    Perennial and seasonal allergic rhinitis (grasses, trees, indoor molds, outdoor molds, dust mites, cat, dog, and cockroach)   Rash - resolved  Plan/Recommendations:   1. Moderate persistent asthma, uncomplicated - Lung function was in the 50% range and it improved slightly with the albuterol. - Maybe this is all related to your recent sinus infection. - We provided a sample of Breztri to use in place of your Symbicort (this contains Symbicort plus a third medication to relax your lungs and open them up). - We will send in a prescription for this and a copay card.  - Daily controller medication(s): Breztri two puffs twice daily and Fasenra every 8 weeks - Prior to physical activity: albuterol 2 puffs 10-15 minutes before physical activity. - Rescue medications: albuterol 4 puffs every 4-6 hours as needed - Asthma control goals:  * Full participation in all desired activities (may need albuterol before activity) * Albuterol use two time or less a week on average (not counting use with activity) * Cough interfering with sleep two time or less a month * Oral steroids no more than once a year * No hospitalizations  2. Chronic rhinitis - Previous testing showed: grasses, trees, indoor molds, outdoor molds, dust mites, cat, dog, and cockroach - Continue with: a daily antihistamine  - Continue with: Nasacort (triamcinolone) one spray per nostril daily AS NEEDED  - You can use an extra dose of the antihistamine, if needed, for breakthrough symptoms.  - Consider nasal saline rinses 1-2 times daily to remove allergens from the nasal cavities as well as help with mucous clearance (this is especially helpful to do before the nasal sprays are given) - Consider allergy shots as a means of long-term control. runny nose, nasal  congestion, etc).     3. Rash - Continue with triamcinolone ointment twice daily as needed. - We should work harder to get this rash under control. - Avoid using on the face, but you can use on your legs and back.   4. GERD  - Continue on omeprazole daily.   4. Return in about 6 months (around 04/05/2023).     Subjective:   Amy Molina is a 62 y.o. female presenting today for follow up of  Chief Complaint  Patient presents with   Follow-up    Good days and bad days. Sometimes she forgets to take her inhaler since she feels like she is doing good. Hasn't had to use her albuterol in 4 months.     Amy Molina has a history of the following: Patient Active Problem List   Diagnosis Date Noted   Seasonal and perennial allergic rhinitis 04/01/2022   Severe persistent asthma without complication 50/27/7412   Rash 06/02/2021   Chronic rhinitis 06/02/2021   Moderate persistent asthma, uncomplicated 87/86/7672   Anxiety 06/17/2020   Asthmatic bronchitis , chronic 06/17/2020   Gastroesophageal reflux disease 04/29/2020   Peripheral edema 04/29/2020   Abnormal CT scan, sigmoid colon    Benign neoplasm of ascending colon    Abdominal pain    [name]    Tubo-ovarian abscess 07/21/2017   Uncontrolled type 2 diabetes mellitus with hyperglycemia ([address]) 07/21/2017   RLQ abdominal pain    Screening for colon cancer    Benign neoplasm of rectum    Fibroid uterus 10/22/2014  Shortness of breath 11/26/2013   Hypertension 03/19/2012   Diabetes mellitus, type 2 ([place]) 03/19/2012   Obesity, morbid ([place]) 03/19/2012    History obtained from: chart review and patient.  Amy Molina is a 62 y.o. female presenting for a follow up visit.  She was last seen in July 2023.  At that time, we continue with Symbicort 160 mcg 2 puffs twice daily as well as albuterol as needed.  She was continued on Fasenra every 2 months.  For her allergic rhinitis, we continue with Claritin as well as  Flonase and nasal saline rinses.  For reflux, she was continued on omeprazole.  She did have an elevated blood pressure.  Since last visit, she has done very well.   Asthma/Respiratory Symptom History: She has bene using Symbicort two puffs twice daily. She does have a spacer. She does have periods where she feels that she should use her albuterol and then realizes after the fact that she should have used her albuterol. If she is at the house, she is fine. But moving around is a bit more.  She has not needed prednisone and has not been in the hospital. She has largely been doing very well. She can tell when her Berna Bue is wearing off. She feels bad for a few days before the next injection is due. She loves how she feels with the [place].   She has been able to do a lot of small things much more effectively at her workplace. She is able to go anywhere in her workplace without a problem. Her coworkers have told her that they have noticed that she is doing very well. She has not been on prednisone at all and overall she thinks that her quality of life is good.   Allergic Rhinitis Symptom History: She is just getting over a sinus infection two weeks ago. It was treated without antibiotics. She has been using Mucinex.   GERD Symptom History: She takes omeprazole daily and this controls her symptoms. She has been on that since 2000. Pepcid does not work. She has been on Nexium.   She started [facility]. She can tell that her clothes are fitting better. She has not lost weight, however, on the scale. She does feel better and she reports that her appetite is much lower than it was previously.   Otherwise, there have been no changes to her past medical history, surgical history, family history, or social history.    Review of Systems  Constitutional: Negative.  Negative for chills, fever, malaise/fatigue and weight loss.  HENT:  Negative for congestion, ear discharge, ear pain and sinus pain.   Eyes:   Negative for pain, discharge and redness.  Respiratory:  Negative for cough, sputum production, shortness of breath and wheezing.   Cardiovascular: Negative.  Negative for chest pain and palpitations.  Gastrointestinal:  Negative for abdominal pain, constipation, diarrhea, heartburn, nausea and vomiting.  Skin: Negative.  Negative for itching and rash.  Neurological:  Negative for dizziness and headaches.  Endo/Heme/Allergies:  Negative for environmental allergies. Does not bruise/bleed easily.  All other systems reviewed and are negative.      Objective:   Blood pressure (!) 154/90, pulse 93, temperature 98.7 F (37.1 C), resp. rate 16, height '5\' 5"'$  (1.651 m), weight (!) 317 lb (143.8 kg), last menstrual period 08/09/2016, SpO2 96 %. Body mass index is 52.75 kg/m.    Physical Exam Vitals reviewed.  Constitutional:      Appearance: She is well-developed.  Comments: Lovely and pleasant.   HENT:     Head: Normocephalic and atraumatic.     Right Ear: Tympanic membrane, ear canal and external ear normal. No drainage, swelling or tenderness. Tympanic membrane is not injected, scarred, erythematous, retracted or bulging.     Left Ear: Tympanic membrane, ear canal and external ear normal. No drainage, swelling or tenderness. Tympanic membrane is not injected, scarred, erythematous, retracted or bulging.     Nose: No nasal deformity, septal deviation, mucosal edema or rhinorrhea.     Right Turbinates: Enlarged, swollen and pale.     Left Turbinates: Enlarged, swollen and pale.     Right Sinus: No maxillary sinus tenderness or frontal sinus tenderness.     Left Sinus: No maxillary sinus tenderness or frontal sinus tenderness.     Comments: No nasal polyps noted.     Mouth/Throat:     Lips: Pink.     Mouth: Mucous membranes are moist. Mucous membranes are not pale and not dry.     Pharynx: Uvula midline.  Eyes:     General:        Right eye: No discharge.        Left eye: No  discharge.     Conjunctiva/sclera: Conjunctivae normal.     Right eye: Right conjunctiva is not injected. No chemosis.    Left eye: Left conjunctiva is not injected. No chemosis.    Pupils: Pupils are equal, round, and reactive to light.  Cardiovascular:     Rate and Rhythm: Normal rate and regular rhythm.     Heart sounds: Normal heart sounds.  Pulmonary:     Effort: Pulmonary effort is normal. No tachypnea, accessory muscle usage or respiratory distress.     Breath sounds: Normal breath sounds. No wheezing, rhonchi or rales.     Comments: Moving air well in all lung fields. No increased work of breathing.  Chest:     Chest wall: No tenderness.  Lymphadenopathy:     Head:     Right side of head: No submandibular, tonsillar or occipital adenopathy.     Left side of head: No submandibular, tonsillar or occipital adenopathy.     Cervical: No cervical adenopathy.  Skin:    General: Skin is warm.     Capillary Refill: Capillary refill takes less than 2 seconds.     Coloration: Skin is not pale.     Findings: No abrasion, erythema, petechiae or rash. Rash is not papular, urticarial or vesicular.     Comments: No crackles or wheezes noted.   Neurological:     Mental Status: She is alert.  Psychiatric:        Behavior: Behavior is cooperative.      Diagnostic studies:    Spirometry: results abnormal (FEV1: 1.21/54%, FVC: 1.45/51%, FEV1/FVC: 83%).    Spirometry consistent with possible restrictive disease. Albuterol four puffs via MDI treatment given in clinic with significant improvement in FEV1 and FVC per ATS criteria.  Allergy Studies: none       Salvatore Marvel, MD  [facility]

## 2022-10-05 NOTE — Patient Instructions (Addendum)
1. Moderate persistent asthma, uncomplicated - Lung function was in the 50% range and it improved slightly with the albuterol. - Maybe this is all related to your recent sinus infection. - We provided a sample of Breztri to use in place of your Symbicort (this contains Symbicort plus a third medication to relax your lungs and open them up). - We will send in a prescription for this and a copay card.  - Daily controller medication(s): Breztri two puffs twice daily and Fasenra every 8 weeks - Prior to physical activity: albuterol 2 puffs 10-15 minutes before physical activity. - Rescue medications: albuterol 4 puffs every 4-6 hours as needed - Asthma control goals:  * Full participation in all desired activities (may need albuterol before activity) * Albuterol use two time or less a week on average (not counting use with activity) * Cough interfering with sleep two time or less a month * Oral steroids no more than once a year * No hospitalizations  2. Chronic rhinitis - Previous testing showed: grasses, trees, indoor molds, outdoor molds, dust mites, cat, dog, and cockroach - Continue with: a daily antihistamine  - Continue with: Nasacort (triamcinolone) one spray per nostril daily AS NEEDED  - You can use an extra dose of the antihistamine, if needed, for breakthrough symptoms.  - Consider nasal saline rinses 1-2 times daily to remove allergens from the nasal cavities as well as help with mucous clearance (this is especially helpful to do before the nasal sprays are given) - Consider allergy shots as a means of long-term control. runny nose, nasal congestion, etc).     3. Rash - Continue with triamcinolone ointment twice daily as needed. - We should work harder to get this rash under control. - Avoid using on the face, but you can use on your legs and back.   4. GERD  - Continue on omeprazole daily.   4. Return in about 6 months (around 04/05/2023).    Please inform us of any  Emergency Department visits, hospitalizations, or changes in symptoms. Call us before going to the ED for breathing or allergy symptoms since we might be able to fit you in for a sick visit. Feel free to contact us anytime with any questions, problems, or concerns.  It was a pleasure to meet you today!  Websites that have reliable patient information: 1. American Academy of Asthma, Allergy, and Immunology: www.aaaai.org 2. Food Allergy Research and Education (FARE): foodallergy.org 3. Mothers of Asthmatics: http://www.asthmacommunitynetwork.org 4. American College of Allergy, Asthma, and Immunology: www.acaai.org   COVID-19 Vaccine Information can be found at: ShippingScam.co.uk For questions related to vaccine distribution or appointments, please email [email or web address] or call [phone].   We realize that you might be concerned about having an allergic reaction to the COVID19 vaccines. To help with that concern, WE ARE OFFERING THE COVID19 VACCINES IN OUR OFFICE! Ask the front desk for dates!     "Like" [place] on Facebook and Instagram for our latest updates!      A healthy democracy works best when [employer] participate! Make sure you are registered to vote! If you have moved or changed any of your contact information, you will need to get this updated before voting!  In some cases, you MAY be able to register to vote online: [email or web address]

## 2022-10-24 ENCOUNTER — Other Ambulatory Visit: Payer: Self-pay | Admitting: Family

## 2022-10-24 NOTE — Telephone Encounter (Signed)
Requested renewal.

## 2022-11-14 ENCOUNTER — Ambulatory Visit (INDEPENDENT_AMBULATORY_CARE_PROVIDER_SITE_OTHER): Payer: 59

## 2022-11-14 DIAGNOSIS — J4550 Severe persistent asthma, uncomplicated: Secondary | ICD-10-CM

## 2022-12-05 ENCOUNTER — Other Ambulatory Visit (HOSPITAL_COMMUNITY): Payer: Self-pay | Admitting: *Deleted

## 2022-12-05 DIAGNOSIS — Z1231 Encounter for screening mammogram for malignant neoplasm of breast: Secondary | ICD-10-CM

## 2022-12-10 ENCOUNTER — Other Ambulatory Visit: Payer: Self-pay | Admitting: Family Medicine

## 2022-12-12 ENCOUNTER — Encounter (HOSPITAL_COMMUNITY): Payer: Self-pay

## 2022-12-12 ENCOUNTER — Ambulatory Visit (HOSPITAL_COMMUNITY)
Admission: RE | Admit: 2022-12-12 | Discharge: 2022-12-12 | Disposition: A | Discharge status: Home / Self Care | Payer: 59 | Source: Ambulatory Visit | Attending: *Deleted | Admitting: *Deleted

## 2022-12-12 DIAGNOSIS — Z1231 Encounter for screening mammogram for malignant neoplasm of breast: Secondary | ICD-10-CM | POA: Insufficient documentation

## 2023-01-09 ENCOUNTER — Ambulatory Visit (INDEPENDENT_AMBULATORY_CARE_PROVIDER_SITE_OTHER): Payer: 59

## 2023-01-09 DIAGNOSIS — J4540 Moderate persistent asthma, uncomplicated: Secondary | ICD-10-CM

## 2023-01-09 DIAGNOSIS — J4550 Severe persistent asthma, uncomplicated: Secondary | ICD-10-CM

## 2023-03-04 ENCOUNTER — Ambulatory Visit
Admission: EM | Admit: 2023-03-04 | Discharge: 2023-03-04 | Payer: 59 | Attending: Nurse Practitioner | Admitting: Nurse Practitioner

## 2023-03-04 ENCOUNTER — Inpatient Hospital Stay (HOSPITAL_COMMUNITY): Payer: 59

## 2023-03-04 ENCOUNTER — Inpatient Hospital Stay (HOSPITAL_COMMUNITY)
Admission: EM | Admit: 2023-03-04 | Discharge: 2023-03-08 | DRG: 299 | Disposition: A | Discharge status: Home / Self Care | Payer: 59 | Attending: Internal Medicine | Admitting: Internal Medicine

## 2023-03-04 ENCOUNTER — Emergency Department (HOSPITAL_COMMUNITY): Payer: 59

## 2023-03-04 ENCOUNTER — Ambulatory Visit (INDEPENDENT_AMBULATORY_CARE_PROVIDER_SITE_OTHER): Payer: 59

## 2023-03-04 ENCOUNTER — Encounter (HOSPITAL_COMMUNITY): Payer: Self-pay | Admitting: Emergency Medicine

## 2023-03-04 ENCOUNTER — Other Ambulatory Visit: Payer: Self-pay

## 2023-03-04 DIAGNOSIS — Z7951 Long term (current) use of inhaled steroids: Secondary | ICD-10-CM | POA: Diagnosis not present

## 2023-03-04 DIAGNOSIS — I878 Other specified disorders of veins: Secondary | ICD-10-CM | POA: Diagnosis present

## 2023-03-04 DIAGNOSIS — Z8249 Family history of ischemic heart disease and other diseases of the circulatory system: Secondary | ICD-10-CM

## 2023-03-04 DIAGNOSIS — Z7984 Long term (current) use of oral hypoglycemic drugs: Secondary | ICD-10-CM | POA: Diagnosis not present

## 2023-03-04 DIAGNOSIS — R42 Dizziness and giddiness: Secondary | ICD-10-CM

## 2023-03-04 DIAGNOSIS — Z79899 Other long term (current) drug therapy: Secondary | ICD-10-CM

## 2023-03-04 DIAGNOSIS — R4789 Other speech disturbances: Secondary | ICD-10-CM | POA: Diagnosis present

## 2023-03-04 DIAGNOSIS — J4540 Moderate persistent asthma, uncomplicated: Secondary | ICD-10-CM | POA: Diagnosis present

## 2023-03-04 DIAGNOSIS — I82412 Acute embolism and thrombosis of left femoral vein: Secondary | ICD-10-CM | POA: Diagnosis not present

## 2023-03-04 DIAGNOSIS — Z6841 Body Mass Index (BMI) 40.0 and over, adult: Secondary | ICD-10-CM | POA: Diagnosis not present

## 2023-03-04 DIAGNOSIS — Z880 Allergy status to penicillin: Secondary | ICD-10-CM | POA: Diagnosis not present

## 2023-03-04 DIAGNOSIS — G9341 Metabolic encephalopathy: Secondary | ICD-10-CM | POA: Diagnosis present

## 2023-03-04 DIAGNOSIS — Z881 Allergy status to other antibiotic agents status: Secondary | ICD-10-CM

## 2023-03-04 DIAGNOSIS — R079 Chest pain, unspecified: Secondary | ICD-10-CM | POA: Diagnosis not present

## 2023-03-04 DIAGNOSIS — E876 Hypokalemia: Secondary | ICD-10-CM | POA: Diagnosis present

## 2023-03-04 DIAGNOSIS — L03116 Cellulitis of left lower limb: Secondary | ICD-10-CM | POA: Diagnosis present

## 2023-03-04 DIAGNOSIS — I82432 Acute embolism and thrombosis of left popliteal vein: Secondary | ICD-10-CM

## 2023-03-04 DIAGNOSIS — F39 Unspecified mood [affective] disorder: Secondary | ICD-10-CM | POA: Diagnosis present

## 2023-03-04 DIAGNOSIS — I82442 Acute embolism and thrombosis of left tibial vein: Secondary | ICD-10-CM | POA: Diagnosis not present

## 2023-03-04 DIAGNOSIS — K219 Gastro-esophageal reflux disease without esophagitis: Secondary | ICD-10-CM | POA: Diagnosis present

## 2023-03-04 DIAGNOSIS — R404 Transient alteration of awareness: Secondary | ICD-10-CM

## 2023-03-04 DIAGNOSIS — Z882 Allergy status to sulfonamides status: Secondary | ICD-10-CM

## 2023-03-04 DIAGNOSIS — I10 Essential (primary) hypertension: Secondary | ICD-10-CM | POA: Diagnosis present

## 2023-03-04 DIAGNOSIS — R4701 Aphasia: Secondary | ICD-10-CM

## 2023-03-04 DIAGNOSIS — J302 Other seasonal allergic rhinitis: Secondary | ICD-10-CM | POA: Diagnosis present

## 2023-03-04 DIAGNOSIS — I824Y2 Acute embolism and thrombosis of unspecified deep veins of left proximal lower extremity: Secondary | ICD-10-CM

## 2023-03-04 DIAGNOSIS — E119 Type 2 diabetes mellitus without complications: Secondary | ICD-10-CM

## 2023-03-04 DIAGNOSIS — M25572 Pain in left ankle and joints of left foot: Secondary | ICD-10-CM

## 2023-03-04 DIAGNOSIS — Z7982 Long term (current) use of aspirin: Secondary | ICD-10-CM | POA: Diagnosis not present

## 2023-03-04 DIAGNOSIS — L03119 Cellulitis of unspecified part of limb: Secondary | ICD-10-CM | POA: Diagnosis present

## 2023-03-04 DIAGNOSIS — R4182 Altered mental status, unspecified: Secondary | ICD-10-CM | POA: Diagnosis not present

## 2023-03-04 DIAGNOSIS — E785 Hyperlipidemia, unspecified: Secondary | ICD-10-CM | POA: Diagnosis present

## 2023-03-04 DIAGNOSIS — Z825 Family history of asthma and other chronic lower respiratory diseases: Secondary | ICD-10-CM | POA: Diagnosis not present

## 2023-03-04 DIAGNOSIS — Z7901 Long term (current) use of anticoagulants: Secondary | ICD-10-CM

## 2023-03-04 DIAGNOSIS — E1169 Type 2 diabetes mellitus with other specified complication: Secondary | ICD-10-CM | POA: Diagnosis not present

## 2023-03-04 DIAGNOSIS — R6510 Systemic inflammatory response syndrome (SIRS) of non-infectious origin without acute organ dysfunction: Secondary | ICD-10-CM | POA: Diagnosis present

## 2023-03-04 DIAGNOSIS — I82409 Acute embolism and thrombosis of unspecified deep veins of unspecified lower extremity: Secondary | ICD-10-CM | POA: Diagnosis present

## 2023-03-04 LAB — LACTIC ACID, PLASMA: Lactic Acid, Venous: 2.9 mmol/L (ref 0.5–1.9)

## 2023-03-04 LAB — CBC
HCT: 40.6 % (ref 36.0–46.0)
Hemoglobin: 13.5 g/dL (ref 12.0–15.0)
MCH: 31.3 pg (ref 26.0–34.0)
MCHC: 33.3 g/dL (ref 30.0–36.0)
MCV: 94 fL (ref 80.0–100.0)
Platelets: 201 10*3/uL (ref 150–400)
RBC: 4.32 MIL/uL (ref 3.87–5.11)
RDW: 13.6 % (ref 11.5–15.5)
WBC: 32.2 10*3/uL — ABNORMAL HIGH (ref 4.0–10.5)
nRBC: 0 % (ref 0.0–0.2)

## 2023-03-04 LAB — ETHANOL: Alcohol, Ethyl (B): <10 mg/dL (ref <10)

## 2023-03-04 LAB — URINALYSIS, ROUTINE W REFLEX MICROSCOPIC
Bacteria, UA: NONE SEEN
Bilirubin Urine: NEGATIVE
Glucose, UA: NEGATIVE mg/dL
Hgb urine dipstick: NEGATIVE
Ketones, ur: NEGATIVE mg/dL
Leukocytes,Ua: NEGATIVE
Nitrite: NEGATIVE
Protein, ur: 30 mg/dL — AB
Specific Gravity, Urine: 1.017 (ref 1.005–1.030)
pH: 5 (ref 5.0–8.0)

## 2023-03-04 LAB — DIFFERENTIAL
Abs Immature Granulocytes: 0.38 10*3/uL — ABNORMAL HIGH (ref 0.00–0.07)
Basophils Absolute: 0.1 10*3/uL (ref 0.0–0.1)
Basophils Relative: 0 %
Eosinophils Absolute: 0 10*3/uL (ref 0.0–0.5)
Eosinophils Relative: 0 %
Immature Granulocytes: 1 %
Lymphocytes Relative: 8 %
Lymphs Abs: 2.7 10*3/uL (ref 0.7–4.0)
Monocytes Absolute: 1.7 10*3/uL — ABNORMAL HIGH (ref 0.1–1.0)
Monocytes Relative: 5 %
Neutro Abs: 27.4 10*3/uL — ABNORMAL HIGH (ref 1.7–7.7)
Neutrophils Relative %: 86 %

## 2023-03-04 LAB — RAPID URINE DRUG SCREEN, HOSP PERFORMED
Amphetamines: NOT DETECTED
Barbiturates: NOT DETECTED
Benzodiazepines: NOT DETECTED
Cocaine: NOT DETECTED
Opiates: NOT DETECTED
Tetrahydrocannabinol: NOT DETECTED

## 2023-03-04 LAB — COMPREHENSIVE METABOLIC PANEL
ALT: 17 U/L (ref 0–44)
AST: 30 U/L (ref 15–41)
Albumin: 3.4 g/dL — ABNORMAL LOW (ref 3.5–5.0)
Alkaline Phosphatase: 65 U/L (ref 38–126)
Anion gap: 12 (ref 5–15)
BUN: 13 mg/dL (ref 6–20)
CO2: 23 mmol/L (ref 22–32)
Calcium: 8 mg/dL — ABNORMAL LOW (ref 8.9–10.3)
Chloride: 99 mmol/L (ref 98–111)
Creatinine, Ser: 0.96 mg/dL (ref 0.44–1.00)
GFR, Estimated: >60 mL/min (ref >60)
Glucose, Bld: 147 mg/dL — ABNORMAL HIGH (ref 70–99)
Potassium: 3.1 mmol/L — ABNORMAL LOW (ref 3.5–5.1)
Sodium: 134 mmol/L — ABNORMAL LOW (ref 135–145)
Total Bilirubin: 1.8 mg/dL — ABNORMAL HIGH (ref 0.3–1.2)
Total Protein: 7.3 g/dL (ref 6.5–8.1)

## 2023-03-04 LAB — HIV ANTIBODY (ROUTINE TESTING W REFLEX): HIV Screen 4th Generation wRfx: NONREACTIVE

## 2023-03-04 LAB — CBG MONITORING, ED: Glucose-Capillary: 133 mg/dL — ABNORMAL HIGH (ref 70–99)

## 2023-03-04 LAB — PROCALCITONIN: Procalcitonin: 15.48 ng/mL

## 2023-03-04 LAB — TSH: TSH: 1.587 u[IU]/mL (ref 0.350–4.500)

## 2023-03-04 LAB — GLUCOSE, CAPILLARY
Glucose-Capillary: 159 mg/dL — ABNORMAL HIGH (ref 70–99)
Glucose-Capillary: 158 mg/dL — ABNORMAL HIGH (ref 70–99)

## 2023-03-04 LAB — SEDIMENTATION RATE: Sed Rate: 42 mm/hr — ABNORMAL HIGH (ref 0–22)

## 2023-03-04 LAB — APTT: aPTT: 33 seconds (ref 24–36)

## 2023-03-04 LAB — MAGNESIUM: Magnesium: 1.2 mg/dL — ABNORMAL LOW (ref 1.7–2.4)

## 2023-03-04 LAB — PROTIME-INR
INR: 1.3 — ABNORMAL HIGH (ref 0.8–1.2)
Prothrombin Time: 16.3 seconds — ABNORMAL HIGH (ref 11.4–15.2)

## 2023-03-04 LAB — VITAMIN B12: Vitamin B-12: 262 pg/mL (ref 180–914)

## 2023-03-04 LAB — POCT FASTING CBG KUC MANUAL ENTRY: POCT Glucose (KUC): 140 mg/dL — AB (ref 70–99)

## 2023-03-04 LAB — C-REACTIVE PROTEIN: CRP: 25 mg/dL — ABNORMAL HIGH (ref <1.0)

## 2023-03-04 LAB — PHOSPHORUS: Phosphorus: 2.5 mg/dL (ref 2.5–4.6)

## 2023-03-04 LAB — HEMOGLOBIN A1C
Hgb A1c MFr Bld: 5.3 % (ref 4.8–5.6)
Mean Plasma Glucose: 105.41 mg/dL

## 2023-03-04 MED ORDER — VITAMIN B-12 500 MCG PO TABS: 500.0000 ug | ORAL_TABLET | Freq: Every day | ORAL | Status: DC

## 2023-03-04 MED ORDER — SENNOSIDES-DOCUSATE SODIUM 8.6-50 MG PO TABS: 1.0000 | ORAL_TABLET | Freq: Every evening | ORAL | Status: DC | PRN

## 2023-03-04 MED ORDER — TRAZODONE HCL 50 MG PO TABS: 25.0000 mg | ORAL_TABLET | Freq: Every evening | ORAL | Status: DC | PRN

## 2023-03-04 MED ORDER — IPRATROPIUM BROMIDE 0.02 % IN SOLN: 0.5000 mg | Freq: Four times a day (QID) | RESPIRATORY_TRACT | Status: DC | PRN

## 2023-03-04 MED ORDER — HEPARIN SODIUM (PORCINE) 5000 UNIT/ML IJ SOLN
5000.0000 [IU] | Freq: Three times a day (TID) | INTRAMUSCULAR | Status: DC
  Administered 2023-03-04 – 2023-03-05 (×2): 5000 [IU] via SUBCUTANEOUS
  Filled 2023-03-04 (×2): qty 1

## 2023-03-04 MED ORDER — ONDANSETRON HCL 4 MG/2ML IJ SOLN
4.0000 mg | Freq: Four times a day (QID) | INTRAMUSCULAR | Status: DC | PRN
  Administered 2023-03-04: 4 mg via INTRAVENOUS
  Filled 2023-03-04: qty 2

## 2023-03-04 MED ORDER — ALPRAZOLAM 0.5 MG PO TABS: 0.5000 mg | ORAL_TABLET | Freq: Three times a day (TID) | ORAL | Status: DC | PRN

## 2023-03-04 MED ORDER — TRAZODONE HCL 50 MG PO TABS: 50.0000 mg | ORAL_TABLET | Freq: Every evening | ORAL | Status: DC | PRN

## 2023-03-04 MED ORDER — DOXYCYCLINE HYCLATE 100 MG PO TABS: 100.0000 mg | ORAL_TABLET | Freq: Two times a day (BID) | ORAL | 0 refills | Status: DC

## 2023-03-04 MED ORDER — ESCITALOPRAM OXALATE 20 MG PO TABS
20.0000 mg | ORAL_TABLET | Freq: Every day | ORAL | Status: DC
  Administered 2023-03-05 – 2023-03-08 (×4): 20 mg via ORAL
  Filled 2023-03-04 (×4): qty 1

## 2023-03-04 MED ORDER — HYDRALAZINE HCL 20 MG/ML IJ SOLN: 10.0000 mg | INTRAMUSCULAR | Status: DC | PRN

## 2023-03-04 MED ORDER — PANTOPRAZOLE SODIUM 40 MG PO TBEC
40.0000 mg | DELAYED_RELEASE_TABLET | Freq: Every day | ORAL | Status: DC
  Administered 2023-03-04 – 2023-03-08 (×5): 40 mg via ORAL
  Filled 2023-03-04 (×5): qty 1

## 2023-03-04 MED ORDER — LACTATED RINGERS IV BOLUS
1000.0000 mL | Freq: Once | INTRAVENOUS | Status: AC
  Administered 2023-03-04: 1000 mL via INTRAVENOUS

## 2023-03-04 MED ORDER — ONDANSETRON HCL 4 MG PO TABS: 4.0000 mg | ORAL_TABLET | Freq: Four times a day (QID) | ORAL | Status: DC | PRN

## 2023-03-04 MED ORDER — SODIUM CHLORIDE 0.9 % IV SOLN: INTRAVENOUS | Status: DC

## 2023-03-04 MED ORDER — MAGNESIUM SULFATE 2 GM/50ML IV SOLN
2.0000 g | Freq: Once | INTRAVENOUS | Status: AC
  Administered 2023-03-04: 2 g via INTRAVENOUS
  Filled 2023-03-04: qty 50

## 2023-03-04 MED ORDER — SODIUM CHLORIDE 0.9 % IV SOLN
2.0000 g | Freq: Once | INTRAVENOUS | Status: AC
  Administered 2023-03-04: 2 g via INTRAVENOUS
  Filled 2023-03-04: qty 20

## 2023-03-04 MED ORDER — SODIUM CHLORIDE 0.9% FLUSH
3.0000 mL | Freq: Two times a day (BID) | INTRAVENOUS | Status: DC
  Administered 2023-03-04 – 2023-03-07 (×7): 3 mL via INTRAVENOUS

## 2023-03-04 MED ORDER — OXYCODONE HCL 5 MG PO TABS
5.0000 mg | ORAL_TABLET | ORAL | Status: DC | PRN
  Administered 2023-03-05 – 2023-03-06 (×4): 5 mg via ORAL
  Filled 2023-03-04 (×5): qty 1

## 2023-03-04 MED ORDER — FUROSEMIDE 40 MG PO TABS: 20.0000 mg | ORAL_TABLET | Freq: Every day | ORAL | Status: DC

## 2023-03-04 MED ORDER — CLINDAMYCIN PHOSPHATE 600 MG/50ML IV SOLN
600.0000 mg | Freq: Three times a day (TID) | INTRAVENOUS | Status: DC
  Administered 2023-03-04 – 2023-03-05 (×3): 600 mg via INTRAVENOUS
  Filled 2023-03-04 (×4): qty 50

## 2023-03-04 MED ORDER — ROSUVASTATIN CALCIUM 5 MG PO TABS: 5.0000 mg | ORAL_TABLET | Freq: Every day | ORAL | Status: DC

## 2023-03-04 MED ORDER — SODIUM CHLORIDE 0.9 % IV SOLN: 250.0000 mL | INTRAVENOUS | Status: DC | PRN

## 2023-03-04 MED ORDER — BISACODYL 5 MG PO TBEC: 5.0000 mg | DELAYED_RELEASE_TABLET | Freq: Every day | ORAL | Status: DC | PRN

## 2023-03-04 MED ORDER — INSULIN ASPART 100 UNIT/ML IJ SOLN
0.0000 [IU] | Freq: Three times a day (TID) | INTRAMUSCULAR | Status: DC
  Administered 2023-03-04: 2 [IU] via SUBCUTANEOUS
  Administered 2023-03-04: 3 [IU] via SUBCUTANEOUS
  Administered 2023-03-06 (×2): 2 [IU] via SUBCUTANEOUS
  Administered 2023-03-07 (×2): 3 [IU] via SUBCUTANEOUS
  Filled 2023-03-04: qty 1

## 2023-03-04 MED ORDER — ACETAMINOPHEN 325 MG PO TABS
650.0000 mg | ORAL_TABLET | Freq: Four times a day (QID) | ORAL | Status: DC | PRN
  Administered 2023-03-05 – 2023-03-07 (×3): 650 mg via ORAL
  Filled 2023-03-04 (×4): qty 2

## 2023-03-04 MED ORDER — SODIUM CHLORIDE 0.9 % IV SOLN: INTRAVENOUS | Status: AC

## 2023-03-04 MED ORDER — LEVALBUTEROL HCL 0.63 MG/3ML IN NEBU: 0.6300 mg | INHALATION_SOLUTION | Freq: Four times a day (QID) | RESPIRATORY_TRACT | Status: DC | PRN

## 2023-03-04 MED ORDER — FLUTICASONE FUROATE-VILANTEROL 200-25 MCG/ACT IN AEPB
1.0000 | INHALATION_SPRAY | Freq: Every day | RESPIRATORY_TRACT | Status: DC
  Administered 2023-03-05 – 2023-03-08 (×4): 1 via RESPIRATORY_TRACT
  Filled 2023-03-04: qty 28

## 2023-03-04 MED ORDER — ACETAMINOPHEN 650 MG RE SUPP: 650.0000 mg | Freq: Four times a day (QID) | RECTAL | Status: DC | PRN

## 2023-03-04 MED ORDER — LORATADINE 10 MG PO TABS: 10.0000 mg | ORAL_TABLET | Freq: Every day | ORAL | Status: DC

## 2023-03-04 MED ORDER — RISAQUAD PO CAPS
2.0000 | ORAL_CAPSULE | Freq: Three times a day (TID) | ORAL | Status: DC
  Administered 2023-03-04 – 2023-03-08 (×12): 2 via ORAL
  Filled 2023-03-04 (×12): qty 2

## 2023-03-04 MED ORDER — HYDROMORPHONE HCL 1 MG/ML IJ SOLN
0.5000 mg | INTRAMUSCULAR | Status: DC | PRN
  Administered 2023-03-04: 0.5 mg via INTRAVENOUS
  Filled 2023-03-04: qty 1

## 2023-03-04 MED ORDER — NAPHAZOLINE-PHENIRAMINE 0.025-0.3 % OP SOLN: 2.0000 [drp] | Freq: Every day | OPHTHALMIC | Status: DC | PRN

## 2023-03-04 MED ORDER — POTASSIUM CHLORIDE CRYS ER 20 MEQ PO TBCR
40.0000 meq | EXTENDED_RELEASE_TABLET | Freq: Once | ORAL | Status: AC
  Administered 2023-03-04: 40 meq via ORAL
  Filled 2023-03-04: qty 2

## 2023-03-04 MED ORDER — SODIUM CHLORIDE 0.9% FLUSH
3.0000 mL | Freq: Two times a day (BID) | INTRAVENOUS | Status: DC
  Administered 2023-03-04: 3 mL via INTRAVENOUS

## 2023-03-04 MED ORDER — SODIUM CHLORIDE 0.9% FLUSH: 3.0000 mL | INTRAVENOUS | Status: DC | PRN

## 2023-03-04 MED ORDER — FLEET ENEMA 7-19 GM/118ML RE ENEM: 1.0000 | ENEMA | Freq: Once | RECTAL | Status: DC | PRN

## 2023-03-04 NOTE — Assessment & Plan Note (Signed)
Anus Doppler:IMPRESSION: Positive for deep venous thrombosis involving the left femoral, popliteal, and posterior tibial veins.  -Will start anticoagulation pending CT of the head

## 2023-03-04 NOTE — Assessment & Plan Note (Signed)
-  BP stable, holding home medication losartan and Lasix for now

## 2023-03-04 NOTE — Assessment & Plan Note (Signed)
-   Obtaining blood cultures -Patient started on IV Rocephin --will be continued -Lower extremity Doppler study positive for DVT -post CT of the head if no bleeding starting anticoagulation therapy

## 2023-03-04 NOTE — ED Notes (Signed)
Pt transported to [facility]

## 2023-03-04 NOTE — ED Notes (Signed)
[facility] has been called for transport 

## 2023-03-04 NOTE — ED Triage Notes (Signed)
Pt arrived pov from urgent care as urgent care recommended her to come here for further evaluation. Pt states she has had a headache for over 1 week that has progressively gotten more severe. Pt also states "I am having difficulty concentrating and trouble finding my words since Thursday". Pt also c/o L lower leg cellulitis. States she has "a little bit of chest pain"

## 2023-03-04 NOTE — Assessment & Plan Note (Signed)
Continue antihistamines.

## 2023-03-04 NOTE — Assessment & Plan Note (Signed)
There is no height or weight on file to calculate BMI. Last BMI checked around 36 with a weight of 320 pounds Discussed means of healthy diet exercise, follow-up with PCP regarding weight loss program

## 2023-03-04 NOTE — H&P (Signed)
History and Physical   Patient: Amy Molina                            PCP: [name], NP                    DOB: [birth year]            DOA: 03/04/2023 BMW:[id number]             DOS: 03/04/2023, 1:02 PM  [name], NP  Patient coming from:   HOME  I have personally reviewed patient's medical records, in electronic medical records, including:   link, and care everywhere.    Chief Complaint:   Chief Complaint  Patient presents with   Headache    History of present illness:    Amy Molina is a 62 year old female with extensive history of HTN/HLD, DM2, persistent asthma, anxiety, GERD, obesity, seasonal allergy .Marland Kitchen Presenting to the ED with chief complaint of headaches, dizziness, difficulty concentrating, trouble word finding for past few days.  Denies of double vision, or numbness-asymmetric weaknesses. Also complaining of left lower leg swelling, redness, tenderness.   ED evaluation/findings: Blood pressure 128/66, pulse 93, temp. 98.7 F (37.1 C),  RR 20, last menstrual period 08/09/2016, SpO2 96 %. CBC WBC of 32.2 CMP sodium 134, potassium 3.1, glucose 147, Venous Doppler-Positive for deep venous thrombosis involving the left femoral, popliteal, and posterior tibial veins.    Requested patient to be admitted to [facility] -for head imaging ruling out head pathology, and treatment for DVT and left lower extremity cellulitis    Patient at this point Denies having: Fever, Chills, Cough, SOB, Chest Pain, Abd pain, N/V/D, headache, dizziness, lightheadedness,  Dysuria, Joint pain, rash, open wounds     Review of Systems: As per HPI, otherwise 10 point review of systems were negative.   ----------------------------------------------------------------------------------------------------------------------  Allergies  Allergen Reactions   Penicillins Anaphylaxis, Shortness Of Breath and Swelling    DID THE REACTION INVOLVE: Swelling of the  face/tongue/throat, SOB, or low BP? Yes Sudden or severe rash/hives, skin peeling, or the inside of the mouth or nose? No Did it require medical treatment? Yes When did it last happen?      15+ years If all above answers are "NO", may proceed with cephalosporin use.  PATIENT TOLERATED ZOSYN AND MERREM ON 07/21/17 ADMISSION    Floxin [Ofloxacin]     MIGRAINES, INSOMNIA, PARANOIND, RINGING IN EARS   Sulfa Antibiotics Hives   Lipitor [Atorvastatin]     Severe myalgias   Penicillin G Benzathine Rash    Home MEDs:  Prior to Admission medications   Medication Sig Start Date End Date Taking? Authorizing Provider  acetaminophen (TYLENOL) 500 MG tablet Take 1,000 mg by mouth 2 (two) times daily as needed for moderate pain.    [provider]  albuterol (PROVENTIL) (2.5 MG/3ML) 0.083% nebulizer solution Take 2.5 mg by nebulization every 6 (six) hours as needed for wheezing or shortness of breath.    [provider]  albuterol (VENTOLIN HFA) 108 (90 Base) MCG/ACT inhaler INHALE 2 PUFFS BY MOUTH EVERY 4 HOURS AS NEEDED FOR WHEEZING OR SHORTNESS OF BREATH 12/13/22   Ambs, Norvel Richards, FNP  ALPRAZolam Prudy Feeler) 0.5 MG tablet Take 1 tablet (0.5 mg total) by mouth 3 (three) times daily as needed for sleep or anxiety. 12/26/17   Lazaro Arms, MD  aspirin 81 MG tablet Take 81 mg  by mouth at bedtime. Patient not taking: Reported on 04/01/2022    [provider]  cetirizine (ZYRTEC) 10 MG tablet TAKE 1 TABLET BY MOUTH ONCE DAILY AS DIRECTED FOR 30 DAYS 03/24/22   [provider]  clobetasol ointment (TEMOVATE) 0.05 % Apply 1 Application topically 2 (two) times daily. Patient not taking: Reported on 10/05/2022 03/30/22   [provider]  Doxepin HCl 5 % CREA Apply 1-2 gm topically, 3 times a day to affected area. If feeling drowsy, apply at bed time. PRN    [provider]  doxycycline (VIBRA-TABS) 100 MG tablet Take 1 tablet (100 mg total) by mouth 2 (two) times  daily for 7 days. 03/04/23 03/11/23  Leath-Warren, Sadie Haber, NP  escitalopram (LEXAPRO) 20 MG tablet Take 1 tablet by mouth once daily 11/29/20   Lazaro Arms, MD  [facility] PEN 30 MG/ML SOAJ INJECT 30MG  SUBCUTANEOUSLY EVERY 8 WEEKS 10/28/22   Nehemiah Settle, FNP  furosemide (LASIX) 20 MG tablet Take 20 mg by mouth daily. 04/29/20   [provider]  loperamide (IMODIUM A-D) 2 MG tablet Take 2 mg by mouth as needed for diarrhea or loose stools.    [provider]  loratadine (CLARITIN) 10 MG tablet Take 10 mg by mouth daily.    [provider]  losartan (COZAAR) 50 MG tablet Take 1 tablet (50 mg total) by mouth daily. Patient taking differently: Take 25 mg by mouth daily. 11/27/13   Donita Brooks, MD  metFORMIN (GLUCOPHAGE) 1000 MG tablet Take 1 tablet (1,000 mg total) by mouth daily with breakfast. 11/27/13   Donita Brooks, MD  MOUNJARO 2.5 MG/0.5ML Pen Inject into the skin. 03/09/22   [provider]  omeprazole (PRILOSEC) 20 MG capsule Take 20 mg by mouth daily.    [provider]  oxyCODONE-acetaminophen (PERCOCET/ROXICET) 5-325 MG tablet Take 1-2 tablets by mouth every 6 (six) hours as needed for severe pain. Patient not taking: Reported on 10/05/2022 01/14/22   Lorre Nick, MD  predniSONE (DELTASONE) 50 MG tablet Take 1 tablet (50 mg total) by mouth daily. Patient not taking: Reported on 04/01/2022 01/14/22   Lorre Nick, MD  rosuvastatin (CRESTOR) 5 MG tablet Take 5 mg by mouth daily. 03/11/22   [provider]  Spacer/Aero-Holding Chambers (AEROCHAMBER PLUS FLO-VU LARGE) MISC Use as directed with metered dose inhaler. 06/02/21   Alfonse Spruce, MD  SYMBICORT 160-4.5 MCG/ACT inhaler INHALE 2 PUFFS BY MOUTH IN THE MORNING AND AT BEDTIME . MUST KEEP APPOINTMENT WITH PRESCRIBER. 05/23/22   Hetty Blend, FNP  Tetrahydrozoline HCl (VISINE OP) Place 1 drop into both eyes daily as needed (dry eyes).    [provider]   triamcinolone (NASACORT) 55 MCG/ACT AERO nasal inhaler Place 2 sprays in each nostril once a day as needed for stuffy nose Patient not taking: Reported on 10/05/2022 04/01/22   Hetty Blend, FNP  triamcinolone ointment (KENALOG) 0.5 % Apply 1 application topically 2 (two) times daily as needed. Patient not taking: Reported on 10/05/2022 06/02/21   Alfonse Spruce, MD  vitamin B-12 (CYANOCOBALAMIN) 500 MCG tablet Take 500 mcg by mouth daily. Patient not taking: Reported on 04/01/2022    [provider]    PRN MEDs: acetaminophen **OR** acetaminophen, ALPRAZolam, bisacodyl, hydrALAZINE, HYDROmorphone (DILAUDID) injection, ipratropium, levalbuterol, naphazoline-pheniramine, ondansetron **OR** ondansetron (ZOFRAN) IV, oxyCODONE, senna-docusate, traZODone  Past Medical History:  Diagnosis Date   Anxiety    Arthritis    general stiffness of joints  Asthma    Complication of anesthesia    per pt,"hard to wake up past some sedation!   Diabetes mellitus (HCC)    Diarrhea    Heart murmur    childhood years- no mention in adult yrs   History of indigestion    Hypertension    Morbid obesity (HCC)    Recurrent upper respiratory infection (URI)    Seasonal allergies    Spinal stenosis     Past Surgical History:  Procedure Laterality Date   COLONOSCOPY N/A 08/04/2015   Procedure: COLONOSCOPY;  Surgeon: Beverley Fiedler, MD;  Location: [facility];  Service: Gastroenterology;  Laterality: N/A;   COLONOSCOPY     COLONOSCOPY WITH PROPOFOL N/A 10/22/2018   Procedure: COLONOSCOPY WITH PROPOFOL;  Surgeon: Beverley Fiedler, MD;  Location: [facility];  Service: Gastroenterology;  Laterality: N/A;   OOPHORECTOMY  05-2000   REMOVED LEFT TUBE AND OVARY   POLYPECTOMY  10/22/2018   Procedure: POLYPECTOMY;  Surgeon: Beverley Fiedler, MD;  Location: [facility];  Service: Gastroenterology;;   WISDOM TOOTH EXTRACTION       reports that she has never smoked. She has never used smokeless tobacco. She  reports current alcohol use. She reports that she does not use drugs.   Family History  Problem Relation Age of Onset   Eczema Mother    Heart disease Mother    Heart attack Mother    Breast cancer Mother    Hypertension Father    Heart disease Father    Colon polyps Father    COPD Father    Kidney cancer Father    Asthma Sister    Allergic rhinitis Sister    Diabetes Sister    Diabetes Maternal Aunt        7 mat aunts diabetes   Hypertension Maternal Aunt    Lung disease Maternal Aunt    Hypertension Paternal Aunt    Breast cancer Paternal Aunt    Colon cancer Neg Hx     Physical Exam:   Vitals:   03/04/23 1015 03/04/23 1024 03/04/23 1042 03/04/23 1200  BP:  (!) 100/48 128/66 121/63  Pulse: 93 92 93 98  Resp: 20 (!) 21 20 (!) 22  Temp: 98.7 F (37.1 C)   98.6 F (37 C)  TempSrc: Oral   Oral  SpO2: 97% 96% 96% 96%   Constitutional: NAD, calm, comfortable Eyes: PERRL, lids and conjunctivae normal ENMT: Mucous membranes are moist. Posterior pharynx clear of any exudate or lesions.Normal dentition.  Neck: normal, supple, no masses, no thyromegaly Respiratory: clear to auscultation bilaterally, no wheezing, no crackles. Normal respiratory effort. No accessory muscle use.  Cardiovascular: Regular rate and rhythm, no murmurs / rubs / gallops. No extremity edema. 2+ pedal pulses. No carotid bruits.  Abdomen: no tenderness, no masses palpated. No hepatosplenomegaly. Bowel sounds positive.  Musculoskeletal: Left lower extremity erythema edema tenderness  no clubbing / cyanosis. No joint deformity upper and lower extremities. Good ROM, no contractures. Normal muscle tone.  Neurologic: Subjective difficulty with word finding otherwise  Awake, alert oriented x 3, negative for any speech slurring her speech or difficulty expression - CN II-XII grossly intact. Sensation intact, DTR normal. Strength 5/5 in all 4.  Psychiatric: Normal judgment and insight. Alert and oriented x 3.  Normal mood.  Skin: no rashes, lesions, ulcers.  Extensive erythema edema left lower extremity      Labs on admission:    I have personally reviewed following labs and imaging studies  CBC: Recent Labs  Lab 03/04/23 1135  WBC 32.2*  NEUTROABS 27.4*  HGB 13.5  HCT 40.6  MCV 94.0  PLT 201   Basic Metabolic Panel: Recent Labs  Lab 03/04/23 1135  NA 134*  K 3.1*  CL 99  CO2 23  GLUCOSE 147*  BUN 13  CREATININE 0.96  CALCIUM 8.0*   GFR: CrCl cannot be calculated (Unknown ideal weight.). Liver Function Tests: Recent Labs  Lab 03/04/23 1135  AST 30  ALT 17  ALKPHOS 65  BILITOT 1.8*  PROT 7.3  ALBUMIN 3.4*   Coagulation Profile: Recent Labs  Lab 03/04/23 1135  INR 1.3*   Cardiac Enzymes:  CBG: Recent Labs  Lab 03/04/23 1229  GLUCAP 133*    Urine analysis:    Component Value Date/Time   COLORURINE AMBER (A) 03/04/2023 1210   APPEARANCEUR HAZY (A) 03/04/2023 1210   LABSPEC 1.017 03/04/2023 1210   PHURINE 5.0 03/04/2023 1210   GLUCOSEU NEGATIVE 03/04/2023 1210   GLUCOSEU NEGATIVE 07/18/2018 1102   HGBUR NEGATIVE 03/04/2023 1210   BILIRUBINUR NEGATIVE 03/04/2023 1210   KETONESUR NEGATIVE 03/04/2023 1210   PROTEINUR 30 (A) 03/04/2023 1210   UROBILINOGEN 0.2 07/18/2018 1102   NITRITE NEGATIVE 03/04/2023 1210   LEUKOCYTESUR NEGATIVE 03/04/2023 1210    Last A1C:  Lab Results  Component Value Date   HGBA1C 7.1 (H) 07/22/2017     Radiologic Exams on Admission:   US Venous Img Lower Unilateral Left  Result Date: 03/04/2023 CLINICAL DATA:  Left leg redness EXAM: LEFT LOWER EXTREMITY VENOUS DOPPLER ULTRASOUND TECHNIQUE: Gray-scale sonography with graded compression, as well as color Doppler and duplex ultrasound were performed to evaluate the lower extremity deep venous systems from the level of the common femoral vein and including the common femoral, femoral, profunda femoral, popliteal and calf veins including the posterior tibial, peroneal and  gastrocnemius veins when visible. The superficial great saphenous vein was also interrogated. Spectral Doppler was utilized to evaluate flow at rest and with distal augmentation maneuvers in the common femoral, femoral and popliteal veins. COMPARISON:  02/24/2021 FINDINGS: Contralateral Common Femoral Vein: Respiratory phasicity is normal and symmetric with the symptomatic side. No evidence of thrombus. Normal compressibility. Common Femoral Vein: No evidence of thrombus. Normal compressibility, respiratory phasicity and response to augmentation. Saphenofemoral Junction: No evidence of thrombus. Normal compressibility and flow on color Doppler imaging. Profunda Femoral Vein: No evidence of thrombus. Normal compressibility and flow on color Doppler imaging. Femoral Vein: Partially occlusive thrombus within the mid left femoral vein. Occlusive thrombus within the distal aspect of the left femoral vein. Popliteal Vein: Occlusive thrombus within the left popliteal vein. Calf Veins: Thrombus is seen extending into the posterior tibial vein. Peroneal veins were not well assessed. Superficial Great Saphenous Vein: No evidence of thrombus. Normal compressibility. Venous Reflux:  None. Other Findings:  None. IMPRESSION: Positive for deep venous thrombosis involving the left femoral, popliteal, and posterior tibial veins. Electronically Signed   By: Duanne Guess D.O.   On: 03/04/2023 11:17   DG Ankle Complete Left  Result Date: 03/04/2023 CLINICAL DATA:  Acute left ankle pain after fall yesterday. EXAM: LEFT ANKLE COMPLETE - 3+ VIEW COMPARISON:  None Available. FINDINGS: There is no evidence of fracture, dislocation, or joint effusion. There is no evidence of arthropathy or other focal bone abnormality. Soft tissues are unremarkable. IMPRESSION: Negative. Electronically Signed   By: Lupita Raider M.D.   On: 03/04/2023 09:12    EKG:   Independently reviewed.  Orders  placed or performed during the hospital  encounter of 03/04/23   ED EKG   ED EKG   EKG 12-Lead   ---------------------------------------------------------------------------------------------------------------------------------------    Assessment / Plan:   Principal Problem:   Lower extremity cellulitis Active Problems:   Altered mental state   DVT (deep venous thrombosis) (HCC)   SIRS (systemic inflammatory response syndrome) (HCC)   Diabetes mellitus, type 2 (HCC)   Hypertension   Obesity, morbid (HCC)   Gastroesophageal reflux disease   Moderate persistent asthma, uncomplicated   Seasonal and perennial allergic rhinitis   Hyperlipidemia   Hypokalemia   Assessment and Plan: * Lower extremity cellulitis - Obtaining blood cultures -Patient started on IV Rocephin --will be continued -Lower extremity Doppler study positive for DVT -post CT of the head if no bleeding starting anticoagulation therapy  SIRS (systemic inflammatory response syndrome) (HCC) -Meet SIRS criteria -ruling out sepsis (Subjective fever/chills at home since Thursday night) Blood pressure 121/63, pulse 98, temp.  98.6 F (37 C), RR (!) 22, last menstrual period SpO2 96 %. -WBC of 32.2 -Procalcitonin 15.48, -Lactic acid:  -Likely due to cellulitis -Started on Rocephin in ED, will continue for now -Following up with blood cultures, labs, lactic acid  DVT (deep venous thrombosis) (HCC) Venous Doppler:IMPRESSION: Positive for deep venous thrombosis involving the left femoral, popliteal, and posterior tibial veins.  -Will start anticoagulation pending CT of the head  Altered mental state -Objectively patient reporting: Headaches, dizziness, difficulty concentrating and word findings -   -Obtaining CT of the head -stat -unfortunately imaging needs to be obtained at [facility] (CT scan or MRI not available at [facility] today) -Neurochecks every 4 hours -EDP consulting neurology for further evaluation and recommendations -Neuro workup per  neurology  Diabetes mellitus, type 2 (HCC) -Holding home medication including metformin, Mounjaro, -A1c 7.1 -Repeat A1c, check CBG q. ACHS with SSI coverage  Hypokalemia - Potassium 3.1-repleting orally Checking magnesium level  Hyperlipidemia Continue statins  Seasonal and perennial allergic rhinitis Continue antihistamines  Moderate persistent asthma, uncomplicated - Reviewing home medication, continue inhalers -Along with the as needed DuoNeb bronchodilators  Gastroesophageal reflux disease Continue PPI  Obesity, morbid (HCC) There is no height or weight on file to calculate BMI. Last BMI checked around 36 with a weight of 320 pounds Discussed means of healthy diet exercise, follow-up with PCP regarding weight loss program   Hypertension -BP stable, holding home medication losartan and Lasix for now     Consults called:  -------------------------------------------------------------------------------------------------------------------------------------------- DVT prophylaxis:  heparin injection 5,000 Units Start: 03/04/23 2200 TED hose Start: 03/04/23 1148 SCDs Start: 03/04/23 1148   Code Status:   Code Status: Full Code   Admission status: Patient will be admitted as Inpatient, with a greater than 2 midnight length of stay. Level of care: Telemetry Medical   Family Communication:  none at bedside  (The above findings and plan of care has been discussed with patient in detail, the patient expressed understanding and agreement of above plan)  --------------------------------------------------------------------------------------------------------------------------------------------------  Disposition Plan: >3 days Status is: Inpatient Remains inpatient appropriate because: Needing inpatient evaluation to rule out CVA versus TIA, IV fluids IV antibiotic for cellulitis, anticoagulation for  DVT     ----------------------------------------------------------------------------------------------------------------------------------------------------  Time spent:  62  Min.  Was spent seeing and evaluating the patient, reviewing all medical records, drawn plan of care.  SIGNED: Kendell Bane, MD, FHM. FAAFP. [facility], Pager  (Please use [email or web address] to page/ or secure chat through epic) If 7PM-7AM, please  contact night-coverage [email or web address],  03/04/2023, 1:02 PM

## 2023-03-04 NOTE — ED Provider Notes (Signed)
Amy Molina EMERGENCY DEPARTMENT AT [facility] Provider Note   CSN: [id number] Arrival date & time: 03/04/23  0957     History  Chief Complaint  Patient presents with   Headache    Amy Molina is a 62 y.o. female.  HPI Patient presents from urgent care where she initially went due to concern of new word finding difficulty as well as left lower extremity erythema. She is accompanied by her companion who provides additional details.  Patient has had word finding difficulty for the past few days, waxing, waning severity, currently present. No other focal weakness or neuro complaints.  In addition, over the past 2 days the patient has developed erythema throughout the left lower extremity. This is painful, not bloody, nor with discharge. Pain is worse with motion. She had a subjective fever, chills at home, prompting emergency department transfer after going to urgent care initially. I reviewed the patient's urgent care notes as well, and conversed with urgent care nurse practitioner.    Home Medications Prior to Admission medications   Medication Sig Start Date End Date Taking? Authorizing Provider  acetaminophen (TYLENOL) 500 MG tablet Take 1,000 mg by mouth 2 (two) times daily as needed for moderate pain.    [provider]  albuterol (PROVENTIL) (2.5 MG/3ML) 0.083% nebulizer solution Take 2.5 mg by nebulization every 6 (six) hours as needed for wheezing or shortness of breath.    [provider]  albuterol (VENTOLIN HFA) 108 (90 Base) MCG/ACT inhaler INHALE 2 PUFFS BY MOUTH EVERY 4 HOURS AS NEEDED FOR WHEEZING OR SHORTNESS OF BREATH 12/13/22   Ambs, Norvel Richards, FNP  ALPRAZolam Prudy Feeler) 0.5 MG tablet Take 1 tablet (0.5 mg total) by mouth 3 (three) times daily as needed for sleep or anxiety. 12/26/17   Lazaro Arms, MD  aspirin 81 MG tablet Take 81 mg by mouth at bedtime. Patient not taking: Reported on 04/01/2022    [provider]  cetirizine  (ZYRTEC) 10 MG tablet TAKE 1 TABLET BY MOUTH ONCE DAILY AS DIRECTED FOR 30 DAYS 03/24/22   [provider]  clobetasol ointment (TEMOVATE) 0.05 % Apply 1 Application topically 2 (two) times daily. Patient not taking: Reported on 10/05/2022 03/30/22   [provider]  Doxepin HCl 5 % CREA Apply 1-2 gm topically, 3 times a day to affected area. If feeling drowsy, apply at bed time. PRN    [provider]  doxycycline (VIBRA-TABS) 100 MG tablet Take 1 tablet (100 mg total) by mouth 2 (two) times daily for 7 days. 03/04/23 03/11/23  Leath-Warren, Sadie Haber, NP  escitalopram (LEXAPRO) 20 MG tablet Take 1 tablet by mouth once daily 11/29/20   Lazaro Arms, MD  [facility] PEN 30 MG/ML SOAJ INJECT 30MG  SUBCUTANEOUSLY EVERY 8 WEEKS 10/28/22   Nehemiah Settle, FNP  furosemide (LASIX) 20 MG tablet Take 20 mg by mouth daily. 04/29/20   [provider]  loperamide (IMODIUM A-D) 2 MG tablet Take 2 mg by mouth as needed for diarrhea or loose stools.    [provider]  loratadine (CLARITIN) 10 MG tablet Take 10 mg by mouth daily.    [provider]  losartan (COZAAR) 50 MG tablet Take 1 tablet (50 mg total) by mouth daily. Patient taking differently: Take 25 mg by mouth daily. 11/27/13   Donita Brooks, MD  metFORMIN (GLUCOPHAGE) 1000 MG tablet Take 1 tablet (1,000 mg total) by mouth daily with breakfast. 11/27/13   Donita Brooks, MD  MOUNJARO 2.5 MG/0.5ML Pen Inject into the skin. 03/09/22   [provider]  omeprazole (PRILOSEC) 20 MG capsule Take 20 mg by mouth daily.    [provider]  oxyCODONE-acetaminophen (PERCOCET/ROXICET) 5-325 MG tablet Take 1-2 tablets by mouth every 6 (six) hours as needed for severe pain. Patient not taking: Reported on 10/05/2022 01/14/22   Lorre Nick, MD  predniSONE (DELTASONE) 50 MG tablet Take 1 tablet (50 mg total) by mouth daily. Patient not taking: Reported on 04/01/2022 01/14/22   Lorre Nick, MD   rosuvastatin (CRESTOR) 5 MG tablet Take 5 mg by mouth daily. 03/11/22   [provider]  Spacer/Aero-Holding Chambers (AEROCHAMBER PLUS FLO-VU LARGE) MISC Use as directed with metered dose inhaler. 06/02/21   Alfonse Spruce, MD  SYMBICORT 160-4.5 MCG/ACT inhaler INHALE 2 PUFFS BY MOUTH IN THE MORNING AND AT BEDTIME . MUST KEEP APPOINTMENT WITH PRESCRIBER. 05/23/22   Hetty Blend, FNP  Tetrahydrozoline HCl (VISINE OP) Place 1 drop into both eyes daily as needed (dry eyes).    [provider]  triamcinolone (NASACORT) 55 MCG/ACT AERO nasal inhaler Place 2 sprays in each nostril once a day as needed for stuffy nose Patient not taking: Reported on 10/05/2022 04/01/22   Hetty Blend, FNP  triamcinolone ointment (KENALOG) 0.5 % Apply 1 application topically 2 (two) times daily as needed. Patient not taking: Reported on 10/05/2022 06/02/21   Alfonse Spruce, MD  vitamin B-12 (CYANOCOBALAMIN) 500 MCG tablet Take 500 mcg by mouth daily. Patient not taking: Reported on 04/01/2022    [provider]      Allergies    Penicillins, Floxin [ofloxacin], Sulfa antibiotics, Lipitor [atorvastatin], and Penicillin g benzathine    Review of Systems   Review of Systems  All other systems reviewed and are negative.   Physical Exam Updated Vital Signs BP 128/66   Pulse 93   Temp 98.7 F (37.1 C) (Oral)   Resp 20   LMP 08/09/2016   SpO2 96%  Physical Exam Vitals and nursing note reviewed.  Constitutional:      General: She is not in acute distress.    Appearance: She is well-developed. She is obese.  HENT:     Head: Normocephalic and atraumatic.  Eyes:     Conjunctiva/sclera: Conjunctivae normal.  Cardiovascular:     Rate and Rhythm: Normal rate and regular rhythm.  Pulmonary:     Effort: Pulmonary effort is normal. No respiratory distress.     Breath sounds: Normal breath sounds. No stridor.  Abdominal:     General: There is no distension.  Musculoskeletal:        Legs:  Skin:    General: Skin is warm and dry.       Neurological:     Mental Status: She is alert and oriented to person, place, and time.     Comments: Face is symmetric, speech is brief, clear, with possible delay.  Other cranial nerves unremarkable.  No extremity weakness.  Psychiatric:        Mood and Affect: Mood normal.     ED Results / Procedures / Treatments   Labs (all labs ordered are listed, but only abnormal results are displayed) Labs Reviewed  PROTIME-INR - Abnormal; Notable for the following components:      Result Value   Prothrombin Time 16.3 (*)    INR 1.3 (*)    All other components within normal limits  CBC - Abnormal; Notable for the following components:  WBC 32.2 (*)    All other components within normal limits  DIFFERENTIAL - Abnormal; Notable for the following components:   Neutro Abs 27.4 (*)    Monocytes Absolute 1.7 (*)    Abs Immature Granulocytes 0.38 (*)    All other components within normal limits  COMPREHENSIVE METABOLIC PANEL - Abnormal; Notable for the following components:   Sodium 134 (*)    Potassium 3.1 (*)    Glucose, Bld 147 (*)    Calcium 8.0 (*)    Albumin 3.4 (*)    Total Bilirubin 1.8 (*)    All other components within normal limits  CULTURE, BLOOD (ROUTINE X 2)  CULTURE, BLOOD (ROUTINE X 2)  ETHANOL  APTT  RAPID URINE DRUG SCREEN, HOSP PERFORMED  URINALYSIS, ROUTINE W REFLEX MICROSCOPIC  HIV ANTIBODY (ROUTINE TESTING W REFLEX)  MAGNESIUM  PHOSPHORUS  HEMOGLOBIN A1C  TSH  LACTIC ACID, PLASMA  PROCALCITONIN    EKG EKG Interpretation  Date/Time:  Saturday March 04 2023 10:24:01 EDT Ventricular Rate:  91 PR Interval:    QRS Duration: 112 QT Interval:  392 QTC Calculation: 483 R Axis:   -68 Text Interpretation: Sinus rhythm Poor data quality Confirmed by Gerhard Munch [phone]) on 03/04/2023 10:37:37 AM  Radiology US Venous Img Lower Unilateral Left  Result Date: 03/04/2023 CLINICAL DATA:  Left leg  redness EXAM: LEFT LOWER EXTREMITY VENOUS DOPPLER ULTRASOUND TECHNIQUE: Gray-scale sonography with graded compression, as well as color Doppler and duplex ultrasound were performed to evaluate the lower extremity deep venous systems from the level of the common femoral vein and including the common femoral, femoral, profunda femoral, popliteal and calf veins including the posterior tibial, peroneal and gastrocnemius veins when visible. The superficial great saphenous vein was also interrogated. Spectral Doppler was utilized to evaluate flow at rest and with distal augmentation maneuvers in the common femoral, femoral and popliteal veins. COMPARISON:  02/24/2021 FINDINGS: Contralateral Common Femoral Vein: Respiratory phasicity is normal and symmetric with the symptomatic side. No evidence of thrombus. Normal compressibility. Common Femoral Vein: No evidence of thrombus. Normal compressibility, respiratory phasicity and response to augmentation. Saphenofemoral Junction: No evidence of thrombus. Normal compressibility and flow on color Doppler imaging. Profunda Femoral Vein: No evidence of thrombus. Normal compressibility and flow on color Doppler imaging. Femoral Vein: Partially occlusive thrombus within the mid left femoral vein. Occlusive thrombus within the distal aspect of the left femoral vein. Popliteal Vein: Occlusive thrombus within the left popliteal vein. Calf Veins: Thrombus is seen extending into the posterior tibial vein. Peroneal veins were not well assessed. Superficial Great Saphenous Vein: No evidence of thrombus. Normal compressibility. Venous Reflux:  None. Other Findings:  None. IMPRESSION: Positive for deep venous thrombosis involving the left femoral, popliteal, and posterior tibial veins. Electronically Signed   By: Duanne Guess D.O.   On: 03/04/2023 11:17   DG Ankle Complete Left  Result Date: 03/04/2023 CLINICAL DATA:  Acute left ankle pain after fall yesterday. EXAM: LEFT ANKLE  COMPLETE - 3+ VIEW COMPARISON:  None Available. FINDINGS: There is no evidence of fracture, dislocation, or joint effusion. There is no evidence of arthropathy or other focal bone abnormality. Soft tissues are unremarkable. IMPRESSION: Negative. Electronically Signed   By: Lupita Raider M.D.   On: 03/04/2023 09:12    Procedures Procedures    Medications Ordered in ED Medications  heparin injection 5,000 Units (has no administration in time range)  sodium chloride flush (NS) 0.9 % injection 3 mL (3 mLs Intravenous Given  03/04/23 1157)  acetaminophen (TYLENOL) tablet 650 mg (has no administration in time range)    Or  acetaminophen (TYLENOL) suppository 650 mg (has no administration in time range)  oxyCODONE (Oxy IR/ROXICODONE) immediate release tablet 5 mg (has no administration in time range)  HYDROmorphone (DILAUDID) injection 0.5-1 mg (has no administration in time range)  senna-docusate (Senokot-S) tablet 1 tablet (has no administration in time range)  bisacodyl (DULCOLAX) EC tablet 5 mg (has no administration in time range)  ondansetron (ZOFRAN) tablet 4 mg (has no administration in time range)    Or  ondansetron (ZOFRAN) injection 4 mg (has no administration in time range)  ipratropium (ATROVENT) nebulizer solution 0.5 mg (has no administration in time range)  levalbuterol (XOPENEX) nebulizer solution 0.63 mg (has no administration in time range)  hydrALAZINE (APRESOLINE) injection 10 mg (has no administration in time range)  insulin aspart (novoLOG) injection 0-15 Units (has no administration in time range)  furosemide (LASIX) tablet 20 mg (has no administration in time range)  rosuvastatin (CRESTOR) tablet 5 mg (has no administration in time range)  ALPRAZolam (XANAX) tablet 0.5 mg (has no administration in time range)  escitalopram (LEXAPRO) tablet 20 mg (has no administration in time range)  pantoprazole (PROTONIX) EC tablet 40 mg (has no administration in time range)  vitamin  B-12 (CYANOCOBALAMIN) tablet 500 mcg (has no administration in time range)  loratadine (CLARITIN) tablet 10 mg (has no administration in time range)  fluticasone furoate-vilanterol (BREO ELLIPTA) 200-25 MCG/ACT 1 puff (has no administration in time range)  naphazoline-pheniramine (NAPHCON-A) 0.025-0.3 % ophthalmic solution 2 drop (has no administration in time range)  clindamycin (CLEOCIN) IVPB 600 mg (has no administration in time range)  0.9 %  sodium chloride infusion (has no administration in time range)  acidophilus (RISAQUAD) capsule 2 capsule (has no administration in time range)  traZODone (DESYREL) tablet 50 mg (has no administration in time range)  cefTRIAXone (ROCEPHIN) 2 g in sodium chloride 0.9 % 100 mL IVPB (0 g Intravenous Stopped 03/04/23 1147)    ED Course/ Medical Decision Making/ A&P                             Medical Decision Making Adult female with multiple medical issues including diabetes, hypertension, obesity presents with 2 complaints.  New or concern of erythema, pain in the left lower extremities concern for cellulitis, less likely DVT this is a consideration. With concern for cellulitis, encephalopathy versus stroke are additional considerations given the patient's new word finding difficulty. Patient will require additional advanced imaging which is not available at this site, and with concern for substantial cellulitis, antibiotics have started, labs sent, and patient will require transfer for advanced imaging.   Amount and/or Complexity of Data Reviewed Independent Historian: friend External Data Reviewed: notes.    Details: Urgent care notes reviewed Labs: ordered. Decision-making details documented in ED Course. ECG/medicine tests: ordered and independent interpretation performed. Decision-making details documented in ED Course.  Risk Prescription drug management. Decision regarding hospitalization. Diagnosis or treatment significantly limited by  social determinants of health.   12:11 PM I reviewed the patient's ultrasound imaging, labs, and she and her female companion are aware of all findings thus far. Patient found to have substantial leukocytosis consistent with clinical concern for cellulitis, and the patient has started ceftriaxone for this. Patient's ultrasound consistent with DVT throughout the left lower extremity, the patient will require anticoagulation. However, the patient's new neuro complaints, and  word finding difficulty necessitate brain imaging prior to initiation of anticoagulation, and I discussed this with our neurologist.  Neurology will be available for consult pending MRI results which will be performed upon the patient's transfer to [facility] our affiliated facility. Absent evidence for hemorrhage, the patient likely appropriate for initiation of anticoagulation in concert with her antibiotics for her left lower extremity lesions. I discussed patient's case with our internal medicine colleague for admission, transfer.     Final Clinical Impression(s) / ED Diagnoses Final diagnoses:  Word finding difficulty  Cellulitis of left lower extremity  Acute deep vein thrombosis (DVT) of femoral vein of left lower extremity (HCC)    CRITICAL CARE Performed by: Gerhard Munch Total critical care time: 35 minutes Critical care time was exclusive of separately billable procedures and treating other patients. Critical care was necessary to treat or prevent imminent or life-threatening deterioration. Critical care was time spent personally by me on the following activities: development of treatment plan with patient and/or surrogate as well as nursing, discussions with consultants, evaluation of patient's response to treatment, examination of patient, obtaining history from patient or surrogate, ordering and performing treatments and interventions, ordering and review of laboratory studies, ordering and review of  radiographic studies, pulse oximetry and re-evaluation of patient's condition.    Gerhard Munch, MD 03/04/23 1214

## 2023-03-04 NOTE — Assessment & Plan Note (Signed)
-  Meet SIRS criteria -ruling out sepsis (Subjective fever/chills at home since Thursday night) Blood pressure 121/63, pulse 98, temp.  98.6 F (37 C), RR (!) 22, last menstrual period SpO2 96 %. -WBC of 32.2 -Procalcitonin 15.48, -Lactic acid:  -Likely due to cellulitis -Started on Rocephin in ED, will continue for now -Following up with blood cultures, labs, lactic acid

## 2023-03-04 NOTE — Assessment & Plan Note (Signed)
-   Reviewing home medication, continue inhalers -Along with the as needed DuoNeb bronchodilators

## 2023-03-04 NOTE — ED Notes (Signed)
Pt passed swallow screen

## 2023-03-04 NOTE — Assessment & Plan Note (Signed)
-  Holding home medication including metformin, Mounjaro, -A1c 7.1 -Repeat A1c, check CBG q. ACHS with SSI coverage

## 2023-03-04 NOTE — Discharge Instructions (Addendum)
The x-ray of your left ankle is negative for fracture or dislocation. We are providing an Ace wrap for your ankle to provide compression and support.  I am also providing an antibiotic for treatment of possible cellulitis of your left lower leg. As discussed, I am referring you to the emergency department for further evaluation and workup for your dizziness, trouble with word finding, and decreased concentration. Please go to [facility] emergency department.

## 2023-03-04 NOTE — Assessment & Plan Note (Addendum)
-  Objectively patient reporting: Headaches, dizziness, difficulty concentrating and word findings -   -Obtaining CT of the head -stat -unfortunately imaging needs to be obtained at [facility] (CT scan or MRI not available at [facility] today) -Neurochecks every 4 hours -EDP consulting neurology for further evaluation and recommendations -Neuro workup per neurology

## 2023-03-04 NOTE — ED Triage Notes (Addendum)
Pt c/o dizziness chattering teeth, loss of concentration, fever, trouble forming words. Onset yesterday. Pt fell Friday evening due to dizziness injuring left foot it is swollen and has redness.

## 2023-03-04 NOTE — ED Notes (Signed)
Patient is being discharged from the Urgent Care and sent to the Emergency Department via personal vehicle . Per Devra Dopp, NP, patient is in need of higher level of care due to dizziness, trouble with word finding, and decreased concentration. Patient is aware and verbalizes understanding of plan of care.  Vitals:   03/04/23 0814  BP: 114/70  Pulse: (!) 107  Resp: (!) 22  Temp: 99.9 F (37.7 C)  SpO2: 95%

## 2023-03-04 NOTE — Hospital Course (Signed)
Amy Molina is a 62 year old female with extensive history of HTN/HLD, DM2, persistent asthma, anxiety, GERD, obesity, seasonal allergy .Marland Kitchen Presenting to the ED with chief complaint of headaches, dizziness, difficulty concentrating, trouble word finding for past few days.  Denies of double vision, or numbness-asymmetric weaknesses. Also complaining of left lower leg swelling, redness, tenderness.   ED evaluation/findings: Blood pressure 128/66, pulse 93, temp. 98.7 F (37.1 C),  RR 20, last menstrual period 08/09/2016, SpO2 96 %. CBC WBC of 32.2 CMP sodium 134, potassium 3.1, glucose 147, Venous Doppler-Positive for deep venous thrombosis involving the left femoral, popliteal, and posterior tibial veins.    Requested patient to be admitted to [facility] -for head imaging ruling out head pathology, and treatment for DVT and left lower extremity cellulitis

## 2023-03-04 NOTE — Assessment & Plan Note (Signed)
-   Potassium 3.1-repleting orally Checking magnesium level

## 2023-03-04 NOTE — ED Provider Notes (Signed)
[facility]    CSN: [id number] Arrival date & time: 03/04/23  0805      History   Chief Complaint No chief complaint on file.   HPI Amy Molina is a 62 y.o. female.   The history is provided by the patient.   The patient presents with a 1 day history of headache, dizziness, loss of concentration, trouble with word finding, fever, and left lower extremity redness and swelling.  Patient states that she fell 1 day ago due to the dizziness.  Patient reports that she does have a history of vertigo, but this does seem this dizziness feels "different".  Patient also reports that she had a fever.  She also reports chest pain has been present intermittently for the past week.  She also denies 2 episodes of diarrhea 1 day ago.  Patient denies cough, nasal congestion, runny nose, abdominal pain, vomiting, blurred vision, slurred speech, upper extremity weakness, numbness, tingling, or lower extremity weakness.  Patient reports history of hypertension, type 2 diabetes, history of bilateral stasis dermatosis, GERD, asthma, and reflux.    Past Medical History:  Diagnosis Date   Anxiety    Arthritis    general stiffness of joints   Asthma    Complication of anesthesia    per pt,"hard to wake up past some sedation!   Diabetes mellitus (HCC)    Diarrhea    Heart murmur    childhood years- no mention in adult yrs   History of indigestion    Hypertension    Morbid obesity (HCC)    Recurrent upper respiratory infection (URI)    Seasonal allergies    Spinal stenosis     Patient Active Problem List   Diagnosis Date Noted   Seasonal and perennial allergic rhinitis 04/01/2022   Severe persistent asthma without complication 04/01/2022   Rash 06/02/2021   Chronic rhinitis 06/02/2021   Moderate persistent asthma, uncomplicated 06/02/2021   Anxiety 06/17/2020   Asthmatic bronchitis , chronic 06/17/2020   Gastroesophageal reflux disease 04/29/2020   Peripheral edema 04/29/2020    Abnormal CT scan, sigmoid colon    Benign neoplasm of ascending colon    Abdominal pain    [name]    Tubo-ovarian abscess 07/21/2017   Uncontrolled type 2 diabetes mellitus with hyperglycemia (HCC) 07/21/2017   RLQ abdominal pain    Screening for colon cancer    Benign neoplasm of rectum    Fibroid uterus 10/22/2014   Shortness of breath 11/26/2013   Hypertension 03/19/2012   Diabetes mellitus, type 2 (HCC) 03/19/2012   Obesity, morbid (HCC) 03/19/2012    Past Surgical History:  Procedure Laterality Date   COLONOSCOPY N/A 08/04/2015   Procedure: COLONOSCOPY;  Surgeon: Beverley Fiedler, MD;  Location: [facility];  Service: Gastroenterology;  Laterality: N/A;   COLONOSCOPY     COLONOSCOPY WITH PROPOFOL N/A 10/22/2018   Procedure: COLONOSCOPY WITH PROPOFOL;  Surgeon: Beverley Fiedler, MD;  Location: [facility];  Service: Gastroenterology;  Laterality: N/A;   OOPHORECTOMY  05-2000   REMOVED LEFT TUBE AND OVARY   POLYPECTOMY  10/22/2018   Procedure: POLYPECTOMY;  Surgeon: Beverley Fiedler, MD;  Location: [facility];  Service: Gastroenterology;;   WISDOM TOOTH EXTRACTION      OB History     Gravida  1   Para      Term      Preterm      AB  1   Living  0  SAB  1   IAB      Ectopic      Multiple      Live Births               Home Medications    Prior to Admission medications   Medication Sig Start Date End Date Taking? Authorizing Provider  acetaminophen (TYLENOL) 500 MG tablet Take 1,000 mg by mouth 2 (two) times daily as needed for moderate pain.    [provider]  albuterol (PROVENTIL) (2.5 MG/3ML) 0.083% nebulizer solution Take 2.5 mg by nebulization every 6 (six) hours as needed for wheezing or shortness of breath.    [provider]  albuterol (VENTOLIN HFA) 108 (90 Base) MCG/ACT inhaler INHALE 2 PUFFS BY MOUTH EVERY 4 HOURS AS NEEDED FOR WHEEZING OR SHORTNESS OF BREATH 12/13/22   Ambs, Norvel Richards, FNP  ALPRAZolam  Prudy Feeler) 0.5 MG tablet Take 1 tablet (0.5 mg total) by mouth 3 (three) times daily as needed for sleep or anxiety. 12/26/17   Lazaro Arms, MD  aspirin 81 MG tablet Take 81 mg by mouth at bedtime. Patient not taking: Reported on 04/01/2022    [provider]  cetirizine (ZYRTEC) 10 MG tablet TAKE 1 TABLET BY MOUTH ONCE DAILY AS DIRECTED FOR 30 DAYS 03/24/22   [provider]  clobetasol ointment (TEMOVATE) 0.05 % Apply 1 Application topically 2 (two) times daily. Patient not taking: Reported on 10/05/2022 03/30/22   [provider]  Doxepin HCl 5 % CREA Apply 1-2 gm topically, 3 times a day to affected area. If feeling drowsy, apply at bed time. PRN    [provider]  escitalopram (LEXAPRO) 20 MG tablet Take 1 tablet by mouth once daily 11/29/20   Lazaro Arms, MD  [facility] PEN 30 MG/ML SOAJ INJECT 30MG  SUBCUTANEOUSLY EVERY 8 WEEKS 10/28/22   Nehemiah Settle, FNP  furosemide (LASIX) 20 MG tablet Take 20 mg by mouth daily. 04/29/20   [provider]  loperamide (IMODIUM A-D) 2 MG tablet Take 2 mg by mouth as needed for diarrhea or loose stools.    [provider]  loratadine (CLARITIN) 10 MG tablet Take 10 mg by mouth daily.    [provider]  losartan (COZAAR) 50 MG tablet Take 1 tablet (50 mg total) by mouth daily. Patient taking differently: Take 25 mg by mouth daily. 11/27/13   Donita Brooks, MD  metFORMIN (GLUCOPHAGE) 1000 MG tablet Take 1 tablet (1,000 mg total) by mouth daily with breakfast. 11/27/13   Donita Brooks, MD  MOUNJARO 2.5 MG/0.5ML Pen Inject into the skin. 03/09/22   [provider]  omeprazole (PRILOSEC) 20 MG capsule Take 20 mg by mouth daily.    [provider]  oxyCODONE-acetaminophen (PERCOCET/ROXICET) 5-325 MG tablet Take 1-2 tablets by mouth every 6 (six) hours as needed for severe pain. Patient not taking: Reported on 10/05/2022 01/14/22   Lorre Nick, MD  predniSONE (DELTASONE) 50 MG tablet  Take 1 tablet (50 mg total) by mouth daily. Patient not taking: Reported on 04/01/2022 01/14/22   Lorre Nick, MD  rosuvastatin (CRESTOR) 5 MG tablet Take 5 mg by mouth daily. 03/11/22   [provider]  Spacer/Aero-Holding Chambers (AEROCHAMBER PLUS FLO-VU LARGE) MISC Use as directed with metered dose inhaler. 06/02/21   Alfonse Spruce, MD  SYMBICORT 160-4.5 MCG/ACT inhaler INHALE 2 PUFFS BY MOUTH IN THE MORNING AND AT BEDTIME . MUST KEEP APPOINTMENT WITH PRESCRIBER. 05/23/22   Hetty Blend,  FNP  Tetrahydrozoline HCl (VISINE OP) Place 1 drop into both eyes daily as needed (dry eyes).    [provider]  triamcinolone (NASACORT) 55 MCG/ACT AERO nasal inhaler Place 2 sprays in each nostril once a day as needed for stuffy nose Patient not taking: Reported on 10/05/2022 04/01/22   Hetty Blend, FNP  triamcinolone ointment (KENALOG) 0.5 % Apply 1 application topically 2 (two) times daily as needed. Patient not taking: Reported on 10/05/2022 06/02/21   Alfonse Spruce, MD  vitamin B-12 (CYANOCOBALAMIN) 500 MCG tablet Take 500 mcg by mouth daily. Patient not taking: Reported on 04/01/2022    [provider]    Family History Family History  Problem Relation Age of Onset   Eczema Mother    Heart disease Mother    Heart attack Mother    Breast cancer Mother    Hypertension Father    Heart disease Father    Colon polyps Father    COPD Father    Kidney cancer Father    Asthma Sister    Allergic rhinitis Sister    Diabetes Sister    Diabetes Maternal Aunt        7 mat aunts diabetes   Hypertension Maternal Aunt    Lung disease Maternal Aunt    Hypertension Paternal Aunt    Breast cancer Paternal Aunt    Colon cancer Neg Hx     Social History Social History   Tobacco Use   Smoking status: Never   Smokeless tobacco: Never  Vaping Use   Vaping Use: Never used  Substance Use Topics   Alcohol use: Yes    Alcohol/week: 0.0 standard drinks of alcohol     Comment: RARELY   Drug use: No     Allergies   Penicillins, Floxin [ofloxacin], Sulfa antibiotics, Lipitor [atorvastatin], and Penicillin g benzathine   Review of Systems Review of Systems Per HPI  Physical Exam Triage Vital Signs ED Triage Vitals  Enc Vitals Group     BP 03/04/23 0814 114/70     Pulse Rate 03/04/23 0814 (!) 107     Resp 03/04/23 0814 (!) 22     Temp 03/04/23 0814 99.9 F (37.7 C)     Temp Source 03/04/23 0814 Oral     SpO2 03/04/23 0814 95 %     Weight --      Height --      Head Circumference --      Peak Flow --      Pain Score 03/04/23 0815 8     Pain Loc --      Pain Edu? --      Excl. in GC? --    Orthostatic VS for the past 24 hrs:  BP- Lying Pulse- Lying BP- Sitting Pulse- Sitting BP- Standing at 0 minutes Pulse- Standing at 0 minutes  03/04/23 0818 111/69 100 96/54 101 109/70 110    Updated Vital Signs BP 114/70 (BP Location: Right Arm)   Pulse (!) 107   Temp 99.9 F (37.7 C) (Oral)   Resp (!) 22   LMP 08/09/2016   SpO2 95%   Visual Acuity Right Eye Distance:   Left Eye Distance:   Bilateral Distance:    Right Eye Near:   Left Eye Near:    Bilateral Near:     Physical Exam Vitals and nursing note reviewed.  Constitutional:      Appearance: Normal appearance.  HENT:     Head: Normocephalic.  Nose: Nose normal.     Mouth/Throat:     Mouth: Mucous membranes are moist.  Eyes:     Extraocular Movements: Extraocular movements intact.     Conjunctiva/sclera: Conjunctivae normal.     Pupils: Pupils are equal, round, and reactive to light.  Cardiovascular:     Rate and Rhythm: Tachycardia present.     Pulses: Normal pulses.     Heart sounds: Normal heart sounds.  Pulmonary:     Effort: Pulmonary effort is normal. No respiratory distress.     Breath sounds: Normal breath sounds. No stridor. No wheezing, rhonchi or rales.  Abdominal:     General: Bowel sounds are normal.     Palpations: Abdomen is soft.     Tenderness:  There is no abdominal tenderness.  Musculoskeletal:     Cervical back: Normal range of motion.     Left lower leg: Edema present.     Comments: Erythema and edema noted to the left lower extremity  Lymphadenopathy:     Cervical: No cervical adenopathy.  Skin:    General: Skin is warm and dry.  Neurological:     Mental Status: She is alert and oriented to person, place, and time.  Psychiatric:        Mood and Affect: Mood normal.        Behavior: Behavior normal.     UC Treatments / Results  Labs (all labs ordered are listed, but only abnormal results are displayed) Labs Reviewed  POCT FASTING CBG [facility] MANUAL ENTRY - Abnormal; Notable for the following components:      Result Value   POCT Glucose ([facility]) 140 (*)    All other components within normal limits    EKG: Normal sinus rhythm with left axis deviation and suggests right ventricular conduction delay, no STEMI.  Compared to EKG performed on 01/11/2021, 01/08/2021, and 12/13/2017, all previous EKGs indicate left ventricular hypertrophy.   Radiology No results found.  Procedures Procedures (including critical care time)  Medications Ordered in UC Medications - No data to display  Initial Impression / Assessment and Plan / UC Course  I have reviewed the triage vital signs and the nursing notes.  Pertinent labs & imaging results that were available during my care of the patient were reviewed by me and considered in my medical decision making (see chart for details).  The patient presents for complaints of dizziness, decreased concentration, trouble word finding, and intermittent chest pain has been present for the past week.  She is negative for orthostatic hypotension, fingerstick blood glucose is 140.  Patient reports history of vertigo, but states this dizziness "feels different".  She also has swelling and redness to the left lower extremity.  She reports a prior history of bilateral stasis dermatitis; however, she does have  erythema extending up to the left thigh that appears to be consistent with cellulitis.  X-ray of the left ankle due to fall as a result of her dizziness was performed which was negative for fracture or dislocation.  I do suspect cellulitis of the left lower extremity, doxycycline 100 mg twice daily was prescribed.  Because patient continues to complain of the dizziness, and giving her history, patient was referred to the emergency department for further workup and evaluation of her dizziness and neurological symptoms.  Patient's vital signs are stable, she is able to travel via private vehicle.  Patient is in agreement with this plan of care with understanding verbalized.   Final Clinical Impressions(s) / UC  Diagnoses   Final diagnoses:  None   Discharge Instructions   None    ED Prescriptions   None    PDMP not reviewed this encounter.   Abran Cantor, NP 03/04/23 [phone]

## 2023-03-04 NOTE — Assessment & Plan Note (Signed)
-  Continue statins ?

## 2023-03-04 NOTE — Assessment & Plan Note (Signed)
Continue PPI ?

## 2023-03-05 ENCOUNTER — Inpatient Hospital Stay (HOSPITAL_COMMUNITY): Payer: 59

## 2023-03-05 DIAGNOSIS — I10 Essential (primary) hypertension: Secondary | ICD-10-CM | POA: Diagnosis not present

## 2023-03-05 DIAGNOSIS — I82412 Acute embolism and thrombosis of left femoral vein: Secondary | ICD-10-CM | POA: Diagnosis not present

## 2023-03-05 DIAGNOSIS — J4540 Moderate persistent asthma, uncomplicated: Secondary | ICD-10-CM | POA: Diagnosis not present

## 2023-03-05 DIAGNOSIS — R4182 Altered mental status, unspecified: Secondary | ICD-10-CM | POA: Diagnosis not present

## 2023-03-05 DIAGNOSIS — L03116 Cellulitis of left lower limb: Secondary | ICD-10-CM

## 2023-03-05 LAB — CBC
HCT: 35.8 % — ABNORMAL LOW (ref 36.0–46.0)
Hemoglobin: 11.9 g/dL — ABNORMAL LOW (ref 12.0–15.0)
MCH: 31.8 pg (ref 26.0–34.0)
MCHC: 33.2 g/dL (ref 30.0–36.0)
MCV: 95.7 fL (ref 80.0–100.0)
Platelets: 182 10*3/uL (ref 150–400)
RBC: 3.74 MIL/uL — ABNORMAL LOW (ref 3.87–5.11)
RDW: 13.8 % (ref 11.5–15.5)
WBC: 22.2 10*3/uL — ABNORMAL HIGH (ref 4.0–10.5)
nRBC: 0 % (ref 0.0–0.2)

## 2023-03-05 LAB — BASIC METABOLIC PANEL
Anion gap: 8 (ref 5–15)
BUN: 11 mg/dL (ref 6–20)
CO2: 24 mmol/L (ref 22–32)
Calcium: 7.7 mg/dL — ABNORMAL LOW (ref 8.9–10.3)
Chloride: 103 mmol/L (ref 98–111)
Creatinine, Ser: 0.97 mg/dL (ref 0.44–1.00)
GFR, Estimated: >60 mL/min (ref >60)
Glucose, Bld: 116 mg/dL — ABNORMAL HIGH (ref 70–99)
Potassium: 2.9 mmol/L — ABNORMAL LOW (ref 3.5–5.1)
Sodium: 135 mmol/L (ref 135–145)

## 2023-03-05 LAB — ECHOCARDIOGRAM COMPLETE
AR max vel: 2.52 cm2
AV Area VTI: 2.58 cm2
AV Area mean vel: 2.32 cm2
AV Mean grad: 7 mmHg
AV Peak grad: 9.5 mmHg
Ao pk vel: 1.54 m/s
Area-P 1/2: 2.55 cm2
S' Lateral: 2.9 cm

## 2023-03-05 LAB — GLUCOSE, CAPILLARY
Glucose-Capillary: 114 mg/dL — ABNORMAL HIGH (ref 70–99)
Glucose-Capillary: 144 mg/dL — ABNORMAL HIGH (ref 70–99)
Glucose-Capillary: 148 mg/dL — ABNORMAL HIGH (ref 70–99)
Glucose-Capillary: 128 mg/dL — ABNORMAL HIGH (ref 70–99)

## 2023-03-05 LAB — APTT: aPTT: 37 seconds — ABNORMAL HIGH (ref 24–36)

## 2023-03-05 LAB — CULTURE, BLOOD (ROUTINE X 2)

## 2023-03-05 LAB — LACTIC ACID, PLASMA: Lactic Acid, Venous: 1.7 mmol/L (ref 0.5–1.9)

## 2023-03-05 MED ORDER — APIXABAN 5 MG PO TABS
10.0000 mg | ORAL_TABLET | Freq: Two times a day (BID) | ORAL | Status: DC
  Administered 2023-03-05 – 2023-03-08 (×7): 10 mg via ORAL
  Filled 2023-03-05 (×7): qty 2

## 2023-03-05 MED ORDER — APIXABAN 5 MG PO TABS: 5.0000 mg | ORAL_TABLET | Freq: Two times a day (BID) | ORAL | Status: DC

## 2023-03-05 MED ORDER — POTASSIUM CHLORIDE CRYS ER 20 MEQ PO TBCR
40.0000 meq | EXTENDED_RELEASE_TABLET | Freq: Two times a day (BID) | ORAL | Status: AC
  Administered 2023-03-05 (×2): 40 meq via ORAL
  Filled 2023-03-05 (×2): qty 2

## 2023-03-05 MED ORDER — CEFAZOLIN SODIUM-DEXTROSE 2-4 GM/100ML-% IV SOLN
2.0000 g | Freq: Three times a day (TID) | INTRAVENOUS | Status: DC
  Administered 2023-03-05 – 2023-03-08 (×9): 2 g via INTRAVENOUS
  Filled 2023-03-05 (×9): qty 100

## 2023-03-05 MED ORDER — POTASSIUM CHLORIDE 10 MEQ/100ML IV SOLN
10.0000 meq | INTRAVENOUS | Status: AC
  Administered 2023-03-05 (×2): 10 meq via INTRAVENOUS
  Filled 2023-03-05 (×2): qty 100

## 2023-03-05 NOTE — Discharge Instructions (Addendum)
Information on my medicine - ELIQUIS (apixaban)  This medication education was reviewed with me or my healthcare representative as part of my discharge preparation.  The pharmacist that spoke with me during my hospital stay was:  Austin A Paytes, RPH  Why was Eliquis prescribed for you? Eliquis was prescribed to treat blood clots that may have been found in the veins of your legs (deep vein thrombosis) or in your lungs (pulmonary embolism) and to reduce the risk of them occurring again.  What do You need to know about Eliquis ? The starting dose is 10 mg (two 5 mg tablets) taken TWICE daily for the FIRST SEVEN (7) DAYS, then on 03/12/23  the dose is reduced to ONE 5 mg tablet taken TWICE daily.  Eliquis may be taken with or without food.   Try to take the dose about the same time in the morning and in the evening. If you have difficulty swallowing the tablet whole please discuss with your pharmacist how to take the medication safely.  Take Eliquis exactly as prescribed and DO NOT stop taking Eliquis without talking to the doctor who prescribed the medication.  Stopping may increase your risk of developing a new blood clot.  Refill your prescription before you run out.  After discharge, you should have regular check-up appointments with your healthcare provider that is prescribing your Eliquis.    What do you do if you miss a dose? If a dose of ELIQUIS is not taken at the scheduled time, take it as soon as possible on the same day and twice-daily administration should be resumed. The dose should not be doubled to make up for a missed dose.  Important Safety Information A possible side effect of Eliquis is bleeding. You should call your healthcare provider right away if you experience any of the following: Bleeding from an injury or your nose that does not stop. Unusual colored urine (red or dark brown) or unusual colored stools (red or black). Unusual bruising for unknown reasons. A  serious fall or if you hit your head (even if there is no bleeding).  Some medicines may interact with Eliquis and might increase your risk of bleeding or clotting while on Eliquis. To help avoid this, consult your healthcare provider or pharmacist prior to using any new prescription or non-prescription medications, including herbals, vitamins, non-steroidal anti-inflammatory drugs (NSAIDs) and supplements.  This website has more information on Eliquis (apixaban): http://www.eliquis.com/eliquis/home

## 2023-03-05 NOTE — Progress Notes (Signed)
ANTICOAGULATION CONSULT NOTE - Initial Consult  Pharmacy Consult for apixaban Indication: DVT  Allergies  Allergen Reactions   Penicillins Anaphylaxis, Shortness Of Breath and Swelling    DID THE REACTION INVOLVE: Swelling of the face/tongue/throat, SOB, or low BP? Yes Sudden or severe rash/hives, skin peeling, or the inside of the mouth or nose? No Did it require medical treatment? Yes When did it last happen?      15+ years If all above answers are "NO", may proceed with cephalosporin use.  PATIENT TOLERATED ZOSYN AND MERREM ON 07/21/17 ADMISSION    Floxin [Ofloxacin]     MIGRAINES, INSOMNIA, PARANOIND, RINGING IN EARS   Sulfa Antibiotics Hives   Lipitor [Atorvastatin]     Severe myalgias   Penicillin G Benzathine Rash    Vital Signs: Temp: 98.7 F (37.1 C) (06/23 0330) Temp Source: Oral (06/23 0330) BP: 117/64 (06/23 0330) Pulse Rate: 72 (06/23 0000)  Labs: Recent Labs    03/04/23 1135 03/05/23 0456  HGB 13.5 11.9*  HCT 40.6 35.8*  PLT 201 182  APTT 33 37*  LABPROT 16.3*  --   INR 1.3*  --   CREATININE 0.96 0.97    Medical History: Past Medical History:  Diagnosis Date   Anxiety    Arthritis    general stiffness of joints   Asthma    Complication of anesthesia    per pt,"hard to wake up past some sedation!   Diabetes mellitus (HCC)    Diarrhea    Heart murmur    childhood years- no mention in adult yrs   History of indigestion    Hypertension    Morbid obesity (HCC)    Recurrent upper respiratory infection (URI)    Seasonal allergies    Spinal stenosis     Medications:  Facility-Administered Medications Prior to Admission  Medication Dose Route Frequency Provider Last Rate Last Admin   Benralizumab SOSY 30 mg  30 mg Subcutaneous Q28 days Alfonse Spruce, MD   30 mg at 01/09/23 0981   Medications Prior to Admission  Medication Sig Dispense Refill Last Dose   acetaminophen (TYLENOL) 500 MG tablet Take 1,000 mg by mouth 2 (two) times daily  as needed for moderate pain.   03/03/2023   albuterol (PROVENTIL) (2.5 MG/3ML) 0.083% nebulizer solution Take 2.5 mg by nebulization every 6 (six) hours as needed for wheezing or shortness of breath.   03/03/2023   albuterol (VENTOLIN HFA) 108 (90 Base) MCG/ACT inhaler INHALE 2 PUFFS BY MOUTH EVERY 4 HOURS AS NEEDED FOR WHEEZING OR SHORTNESS OF BREATH (Patient taking differently: Inhale 2 puffs into the lungs every 4 (four) hours as needed for shortness of breath.) 18 g 1 03/03/2023   ALPRAZolam (XANAX) 0.5 MG tablet Take 1 tablet (0.5 mg total) by mouth 3 (three) times daily as needed for sleep or anxiety. 90 tablet 1 03/03/2023   BREZTRI AEROSPHERE 160-9-4.8 MCG/ACT AERO Inhale 1 puff into the lungs daily.   03/03/2023   cetirizine (ZYRTEC) 10 MG tablet Take 10 mg by mouth daily.   03/03/2023   Doxepin HCl 5 % CREA Apply 1 Application topically daily at 6 (six) AM.   03/03/2023   doxycycline (VIBRA-TABS) 100 MG tablet Take 1 tablet (100 mg total) by mouth 2 (two) times daily for 7 days. 14 tablet 0 03/04/2023   escitalopram (LEXAPRO) 20 MG tablet Take 1 tablet by mouth once daily 30 tablet 5 03/03/2023   FASENRA PEN 30 MG/ML SOAJ INJECT 30MG  SUBCUTANEOUSLY EVERY 8  WEEKS (Patient taking differently: Inject 30 mg into the skin every 8 (eight) weeks.) 1 mL 9 Past Month   furosemide (LASIX) 20 MG tablet Take 20 mg by mouth daily.   03/03/2023   loperamide (IMODIUM A-D) 2 MG tablet Take 2 mg by mouth daily as needed for diarrhea or loose stools.   Past Week   loratadine (CLARITIN) 10 MG tablet Take 10 mg by mouth daily.   03/03/2023   losartan (COZAAR) 50 MG tablet Take 1 tablet (50 mg total) by mouth daily. (Patient taking differently: Take 25 mg by mouth daily.) 90 tablet 3 03/03/2023   metFORMIN (GLUCOPHAGE) 1000 MG tablet Take 1 tablet (1,000 mg total) by mouth daily with breakfast. 90 tablet 3 03/03/2023   MOUNJARO 2.5 MG/0.5ML Pen Inject 2.5 mg into the skin once a week.   02/26/2023   omeprazole (PRILOSEC) 20  MG capsule Take 20 mg by mouth daily.   03/03/2023   rosuvastatin (CRESTOR) 5 MG tablet Take 5 mg by mouth daily.   03/03/2023   Spacer/Aero-Holding Chambers (AEROCHAMBER PLUS FLO-VU LARGE) MISC Use as directed with metered dose inhaler. 1 each 1 unknown   SYMBICORT 160-4.5 MCG/ACT inhaler INHALE 2 PUFFS BY MOUTH IN THE MORNING AND AT BEDTIME . MUST KEEP APPOINTMENT WITH PRESCRIBER. (Patient taking differently: Inhale 2 puffs into the lungs 2 (two) times daily.) 11 g 4 03/03/2023   Tetrahydrozoline HCl (VISINE OP) Place 1 drop into both eyes daily as needed (dry eyes).   03/03/2023   clobetasol ointment (TEMOVATE) 0.05 % Apply 1 Application topically 2 (two) times daily. (Patient not taking: Reported on 10/05/2022)   Not Taking   oxyCODONE-acetaminophen (PERCOCET/ROXICET) 5-325 MG tablet Take 1-2 tablets by mouth every 6 (six) hours as needed for severe pain. (Patient not taking: Reported on 10/05/2022) 15 tablet 0 Not Taking   predniSONE (DELTASONE) 50 MG tablet Take 1 tablet (50 mg total) by mouth daily. (Patient not taking: Reported on 04/01/2022) 5 tablet 0 Not Taking   triamcinolone (NASACORT) 55 MCG/ACT AERO nasal inhaler Place 2 sprays in each nostril once a day as needed for stuffy nose (Patient not taking: Reported on 03/04/2023) 1 each 5 Not Taking   triamcinolone ointment (KENALOG) 0.5 % Apply 1 application topically 2 (two) times daily as needed. (Patient not taking: Reported on 03/04/2023) 30 g 2 Not Taking   Scheduled:   acidophilus  2 capsule Oral TID   escitalopram  20 mg Oral Daily   fluticasone furoate-vilanterol  1 puff Inhalation Daily   heparin  5,000 Units Subcutaneous Q8H   insulin aspart  0-15 Units Subcutaneous TID WC   pantoprazole  40 mg Oral Daily   sodium chloride flush  3 mL Intravenous Q12H   Infusions:   clindamycin (CLEOCIN) IV 600 mg (03/05/23 0452)    Assessment: 61yo female presented [facility] ED from [facility] for HA, difficulty concentrating, trouble finding words, LLE  cellulitis, and CP, found to have DVT in LLE, admitted for further w/u including imaging of the head to r/o acute process prior to starting anticoagulation; MRI and CT negative >> to begin apixaban.  Plan:  Apixaban 10mg  PO BID x7d followed by 5mg  BID.  Vernard Gambles, PharmD, BCPS  03/05/2023,6:51 AM

## 2023-03-05 NOTE — Progress Notes (Addendum)
PROGRESS NOTE        PATIENT DETAILS Name: Amy Molina Age: 62 y.o. Sex: female Date of Birth: 22-Mar-1961 Admit Date: 03/04/2023 Admitting Physician Kendell Bane, MD [id number], Cala Bradford, NP  Brief Summary: Patient is a 62 y.o.  female with history of DM-2, HTN, HLD, asthma, chronic venous stasis bilateral lower extremities, morbid obesity who presented with LLE extremity redness/swelling-some confusion/dizziness.  Evaluated by [facility]-subsequently transferred to [facility] for further evaluation/treatment.  Significant events: 6/22>> admit to [facility]  Significant studies: 6/22>> x-ray left ankle: No fracture 6/22>> Doppler ultrasound LLE: Positive for DVT involving femoral/popliteal/posterior tibial vein 6/22>> MRI brain: No acute infarct 6/23>> CT head: No acute findings  Significant microbiology data: 6/22>> blood culture: No growth  Procedures: None  Consults: None  Subjective: Lying comfortably in bed-denies any chest pain or shortness of breath.  Objective: Vitals: Blood pressure (!) 106/47, pulse 72, temperature 98.6 F (37 C), temperature source Oral, resp. rate (!) 22, last menstrual period 08/09/2016, SpO2 93 %.   Exam: Gen Exam:Alert awake-not in any distress HEENT:atraumatic, normocephalic Chest: B/L clear to auscultation anteriorly CVS:S1S2 regular Abdomen:soft non tender, non distended Extremities:no edema Neurology: Non focal Skin: no rash  Pertinent Labs/Radiology:    Latest Ref Rng & Units 03/05/2023    4:56 AM 03/04/2023   11:35 AM 08/09/2021    1:18 PM  CBC  WBC 4.0 - 10.5 K/uL 22.2  32.2  12.6   Hemoglobin 12.0 - 15.0 g/dL 62.9  52.8  41.3   Hematocrit 36.0 - 46.0 % 35.8  40.6  41.8   Platelets 150 - 400 K/uL 182  201      Lab Results  Component Value Date   NA 135 03/05/2023   K 2.9 (L) 03/05/2023   CL 103 03/05/2023   CO2 24 03/05/2023      Assessment/Plan: SIRS secondary to superimposed cellulitis on LLE  DVT Improving Stop clindamycin-switch to cefazolin Starting Eliquis now that neuroimaging is negative  LLE DVT Unprovoked Eliquis Outpatient hematology evaluation  Acute metabolic encephalopathy Significantly better today-back to baseline-completely awake/alert Suspect this was from soft tissue infection/cellulitis Neuroimaging negative.  Hypokalemia Replete/recheck  DM-2 (A1c 5.3 on 6/22) CBG stable SSI  HLD Statin  HTN BP stable without the use of any antihypertensives-resume losartan when able.  Bronchial asthma (on Fasenra) Not in exacerbation Bronchodilators  GERD PPI  Mood disorder Seems stable As needed Xanax Lexapro  Morbid Obesity: Estimated body mass index is 52.75 kg/m as calculated from the following:   Height as of 10/05/22: 5\' 5"  (1.651 m).   Weight as of 10/05/22: 143.8 kg.   Code status:   Code Status: Full Code   DVT Prophylaxis: TED hose Start: 03/04/23 1148 SCDs Start: 03/04/23 1148 apixaban (ELIQUIS) tablet 10 mg  apixaban (ELIQUIS) tablet 5 mg    Family Communication: Sister at bedside   Disposition Plan: Status is: Inpatient Remains inpatient appropriate because: Severity of illness   Planned Discharge Destination:Home   Diet: Diet Order             Diet Carb Modified Fluid consistency: Thin; Room service appropriate? Yes  Diet effective now                     Antimicrobial agents: Anti-infectives (From admission, onward)    Start     Dose/Rate Route Frequency  Ordered Stop   03/05/23 1400  ceFAZolin (ANCEF) IVPB 2g/100 mL premix        2 g 200 mL/hr over 30 Minutes Intravenous Every 8 hours 03/05/23 0920     03/04/23 1400  clindamycin (CLEOCIN) IVPB 600 mg  Status:  Discontinued        600 mg 100 mL/hr over 30 Minutes Intravenous Every 8 hours 03/04/23 1209 03/05/23 0920   03/04/23 1045  cefTRIAXone (ROCEPHIN) 2 g in sodium chloride 0.9 % 100 mL IVPB        2 g 200 mL/hr over 30 Minutes Intravenous  Once  03/04/23 1040 03/04/23 1147        MEDICATIONS: Scheduled Meds:  acidophilus  2 capsule Oral TID   apixaban  10 mg Oral BID   Followed by   Melene Muller ON 03/12/2023] apixaban  5 mg Oral BID   escitalopram  20 mg Oral Daily   fluticasone furoate-vilanterol  1 puff Inhalation Daily   insulin aspart  0-15 Units Subcutaneous TID WC   pantoprazole  40 mg Oral Daily   potassium chloride  40 mEq Oral BID   sodium chloride flush  3 mL Intravenous Q12H   Continuous Infusions:   ceFAZolin (ANCEF) IV     potassium chloride 10 mEq (03/05/23 0938)   PRN Meds:.acetaminophen **OR** acetaminophen, ALPRAZolam, bisacodyl, hydrALAZINE, HYDROmorphone (DILAUDID) injection, ipratropium, levalbuterol, ondansetron **OR** ondansetron (ZOFRAN) IV, oxyCODONE, senna-docusate, traZODone   I have personally reviewed following labs and imaging studies  LABORATORY DATA: CBC: Recent Labs  Lab 03/04/23 1135 03/05/23 0456  WBC 32.2* 22.2*  NEUTROABS 27.4*  --   HGB 13.5 11.9*  HCT 40.6 35.8*  MCV 94.0 95.7  PLT 201 182    Basic Metabolic Panel: Recent Labs  Lab 03/04/23 1135 03/04/23 1249 03/05/23 0456  NA 134*  --  135  K 3.1*  --  2.9*  CL 99  --  103  CO2 23  --  24  GLUCOSE 147*  --  116*  BUN 13  --  11  CREATININE 0.96  --  0.97  CALCIUM 8.0*  --  7.7*  MG  --  1.2*  --   PHOS  --  2.5  --     GFR: CrCl cannot be calculated (Unknown ideal weight.).  Liver Function Tests: Recent Labs  Lab 03/04/23 1135  AST 30  ALT 17  ALKPHOS 65  BILITOT 1.8*  PROT 7.3  ALBUMIN 3.4*   No results for input(s): "LIPASE", "AMYLASE" in the last 168 hours. No results for input(s): "AMMONIA" in the last 168 hours.  Coagulation Profile: Recent Labs  Lab 03/04/23 1135  INR 1.3*    Cardiac Enzymes: No results for input(s): "CKTOTAL", "CKMB", "CKMBINDEX", "TROPONINI" in the last 168 hours.  BNP (last 3 results) No results for input(s): "PROBNP" in the last 8760 hours.  Lipid Profile: No  results for input(s): "CHOL", "HDL", "LDLCALC", "TRIG", "CHOLHDL", "LDLDIRECT" in the last 72 hours.  Thyroid Function Tests: Recent Labs    03/04/23 1135  TSH 1.587    Anemia Panel: Recent Labs    03/04/23 1407  VITAMINB12 262    Urine analysis:    Component Value Date/Time   COLORURINE AMBER (A) 03/04/2023 1210   APPEARANCEUR HAZY (A) 03/04/2023 1210   LABSPEC 1.017 03/04/2023 1210   PHURINE 5.0 03/04/2023 1210   GLUCOSEU NEGATIVE 03/04/2023 1210   GLUCOSEU NEGATIVE 07/18/2018 1102   HGBUR NEGATIVE 03/04/2023 1210   BILIRUBINUR NEGATIVE 03/04/2023 1210  KETONESUR NEGATIVE 03/04/2023 1210   PROTEINUR 30 (A) 03/04/2023 1210   UROBILINOGEN 0.2 07/18/2018 1102   NITRITE NEGATIVE 03/04/2023 1210   LEUKOCYTESUR NEGATIVE 03/04/2023 1210    Sepsis Labs: Lactic Acid, Venous    Component Value Date/Time   LATICACIDVEN 2.9 (HH) 03/04/2023 1314    MICROBIOLOGY: Recent Results (from the past 240 hour(s))  Culture, blood (Routine X 2) w Reflex to ID Panel     Status: None (Preliminary result)   Collection Time: 03/04/23 12:49 PM   Specimen: Right Antecubital; Blood  Result Value Ref Range Status   Specimen Description   Final    RIGHT ANTECUBITAL BOTTLES DRAWN AEROBIC AND ANAEROBIC   Special Requests   Final    Blood Culture adequate volume Performed at [facility], [address] [place], [place] [address]    Culture PENDING  Incomplete   Report Status PENDING  Incomplete  Culture, blood (Routine X 2) w Reflex to ID Panel     Status: None (Preliminary result)   Collection Time: 03/04/23 12:49 PM   Specimen: Left Antecubital; Blood  Result Value Ref Range Status   Specimen Description   Final    LEFT ANTECUBITAL BOTTLES DRAWN AEROBIC AND ANAEROBIC   Special Requests   Final    Blood Culture adequate volume Performed at [facility], [address] [place], [place] [address]    Culture PENDING  Incomplete   Report Status PENDING  Incomplete    RADIOLOGY  STUDIES/RESULTS: CT HEAD WO CONTRAST ( )  Result Date: 03/05/2023 CLINICAL DATA:  TIA. EXAM: CT HEAD WITHOUT CONTRAST TECHNIQUE: Contiguous axial images were obtained from the base of the skull through the vertex without intravenous contrast. RADIATION DOSE REDUCTION: This exam was performed according to the departmental dose-optimization program which includes automated exposure control, adjustment of the mA and/or kV according to patient size and/or use of iterative reconstruction technique. COMPARISON:  Brain MRI from yesterday FINDINGS: Brain: No evidence of acute infarction, hemorrhage, hydrocephalus, extra-axial collection or mass lesion/mass effect. Small chronic left cerebellar infarcts as seen on prior MRI. Cavum velum interpositum. Vascular: No hyperdense vessel or unexpected calcification. Skull: Normal. Negative for fracture or focal lesion. Sinuses/Orbits: No acute finding. IMPRESSION: 1. No acute finding or change from MRI yesterday. 2. Remote left cerebellar infarcts. Electronically Signed   By: Tiburcio Pea M.D.   On: 03/05/2023 04:11   MR BRAIN WO CONTRAST  Result Date: 03/04/2023 CLINICAL DATA:  Neuro deficit, acute, stroke suspected word finding difficulty EXAM: MRI HEAD WITHOUT CONTRAST TECHNIQUE: Multiplanar, multiecho pulse sequences of the brain and surrounding structures were obtained without intravenous contrast. COMPARISON:  CT Head 04/19/14 FINDINGS: Limitations: Motion degraded exam. Brain: Negative for an acute infarct. No hemorrhage. No hydrocephalus. No extra-axial fluid collection. There is chronic infarct in the left cerebellum. Sequela of mild overall chronic microvascular ischemic change. Cavum velum interpositum. Vascular: Normal flow voids. Skull and upper cervical spine: Normal marrow signal. Sinuses/Orbits: No middle ear or mastoid effusion. Mild mucosal thickening in the bilateral ethmoid sinuses. Orbits are unremarkable Other: None. IMPRESSION: Motion degrade exam.  1.  Negative for an acute infarct. 2.  Chronic left cerebellar infarcts. Electronically Signed   By: Lorenza Cambridge M.D.   On: 03/04/2023 17:56   US Venous Img Lower Unilateral Left  Result Date: 03/04/2023 CLINICAL DATA:  Left leg redness EXAM: LEFT LOWER EXTREMITY VENOUS DOPPLER ULTRASOUND TECHNIQUE: Gray-scale sonography with graded compression, as well as color Doppler and duplex ultrasound were performed to evaluate the  lower extremity deep venous systems from the level of the common femoral vein and including the common femoral, femoral, profunda femoral, popliteal and calf veins including the posterior tibial, peroneal and gastrocnemius veins when visible. The superficial great saphenous vein was also interrogated. Spectral Doppler was utilized to evaluate flow at rest and with distal augmentation maneuvers in the common femoral, femoral and popliteal veins. COMPARISON:  02/24/2021 FINDINGS: Contralateral Common Femoral Vein: Respiratory phasicity is normal and symmetric with the symptomatic side. No evidence of thrombus. Normal compressibility. Common Femoral Vein: No evidence of thrombus. Normal compressibility, respiratory phasicity and response to augmentation. Saphenofemoral Junction: No evidence of thrombus. Normal compressibility and flow on color Doppler imaging. Profunda Femoral Vein: No evidence of thrombus. Normal compressibility and flow on color Doppler imaging. Femoral Vein: Partially occlusive thrombus within the mid left femoral vein. Occlusive thrombus within the distal aspect of the left femoral vein. Popliteal Vein: Occlusive thrombus within the left popliteal vein. Calf Veins: Thrombus is seen extending into the posterior tibial vein. Peroneal veins were not well assessed. Superficial Great Saphenous Vein: No evidence of thrombus. Normal compressibility. Venous Reflux:  None. Other Findings:  None. IMPRESSION: Positive for deep venous thrombosis involving the left femoral, popliteal,  and posterior tibial veins. Electronically Signed   By: Duanne Guess D.O.   On: 03/04/2023 11:17   DG Ankle Complete Left  Result Date: 03/04/2023 CLINICAL DATA:  Acute left ankle pain after fall yesterday. EXAM: LEFT ANKLE COMPLETE - 3+ VIEW COMPARISON:  None Available. FINDINGS: There is no evidence of fracture, dislocation, or joint effusion. There is no evidence of arthropathy or other focal bone abnormality. Soft tissues are unremarkable. IMPRESSION: Negative. Electronically Signed   By: Lupita Raider M.D.   On: 03/04/2023 09:12     LOS: 1 day   Jeoffrey Massed, MD  [facility]    To contact the attending provider between 7A-7P or the covering provider during after hours 7P-7A, please log into the web site [email or web address] and access using universal [place] password for that web site. If you do not have the password, please call the hospital operator.  03/05/2023, 9:42 AM

## 2023-03-05 NOTE — Evaluation (Signed)
Physical Therapy Evaluation Patient Details Name: Amy Molina MRN: [id number] DOB: 11-26-1960 Today's Date: 03/05/2023  History of Present Illness  Pt is a 62 y.o. female admitted 6/22 with LLE DVT and cellulitis. PMH: DM-2, HTN, HLD, asthma, chronic venous stasis BLE, morbid obesity   Clinical Impression  Pt admitted with above diagnosis. PTA pt lived at home with family, independent, employed, and driving. Pt currently with functional limitations due to the deficits listed below (see PT Problem List). On eval, she required supervision transfers and ambulation 150' without AD. Pt will benefit from acute skilled PT to increase their independence and safety with mobility to allow discharge. PT to follow acutely. No follow up services indicated.         Recommendations for follow up therapy are one component of a multi-disciplinary discharge planning process, led by the attending physician.  Recommendations may be updated based on patient status, additional functional criteria and insurance authorization.  Follow Up Recommendations       Assistance Recommended at Discharge None  Patient can return home with the following       Equipment Recommendations None recommended by PT  Recommendations for Other Services       Functional Status Assessment Patient has had a recent decline in their functional status and demonstrates the ability to make significant improvements in function in a reasonable and predictable amount of time.     Precautions / Restrictions Precautions Precautions: None      Mobility  Bed Mobility Overal bed mobility: Modified Independent             General bed mobility comments: +rail, increased time    Transfers Overall transfer level: Needs assistance Equipment used: None Transfers: Sit to/from Stand Sit to Stand: Supervision           General transfer comment: supervision for line management    Ambulation/Gait Ambulation/Gait  assistance: Supervision Gait Distance (Feet): 150 Feet Assistive device: IV Pole Gait Pattern/deviations: Wide base of support, Step-through pattern, Decreased stride length   Gait velocity interpretation: 1.31 - 2.62 ft/sec, indicative of limited community ambulator   General Gait Details: steady Insurance risk surveyor    Modified Rankin (Stroke Patients Only)       Balance Overall balance assessment: Mild deficits observed, not formally tested                                           Pertinent Vitals/Pain Pain Assessment Pain Assessment: 0-10 Pain Score: 8  Pain Location: LLE Pain Descriptors / Indicators: Discomfort, Tender, Sore Pain Intervention(s): Limited activity within patient's tolerance, Monitored during session, Repositioned    Home Living Family/patient expects to be discharged to:: Private residence Living Arrangements: Other relatives Available Help at Discharge: Family;Available PRN/intermittently Type of Home: House Home Access: Stairs to enter Entrance Stairs-Rails: None Entrance Stairs-Number of Steps: 3   Home Layout: One level Home Equipment: Cane - single point      Prior Function Prior Level of Function : Independent/Modified Independent;Working/employed;Driving             Mobility Comments: works at [employer] full time (3rd shift, 12 hours)       Hand Dominance        Extremity/Trunk Assessment   Upper Extremity Assessment Upper Extremity Assessment: Overall WFL for tasks assessed  Lower Extremity Assessment Lower Extremity Assessment: LLE deficits/detail LLE: Unable to fully assess due to pain    Cervical / Trunk Assessment Cervical / Trunk Assessment: Normal  Communication   Communication: No difficulties  Cognition Arousal/Alertness: Awake/alert Behavior During Therapy: WFL for tasks assessed/performed Overall Cognitive Status: Within Functional Limits for tasks assessed                                           General Comments General comments (skin integrity, edema, etc.): VSS on RA    Exercises     Assessment/Plan    PT Assessment Patient needs continued PT services  PT Problem List Decreased mobility;Decreased activity tolerance;Pain       PT Treatment Interventions Functional mobility training;Patient/family education;Therapeutic activities;Gait training;Stair training;Therapeutic exercise    PT Goals (Current goals can be found in the Care Plan section)  Acute Rehab PT Goals Patient Stated Goal: home PT Goal Formulation: With patient Time For Goal Achievement: 03/19/23 Potential to Achieve Goals: Good    Frequency Min 3X/week     Co-evaluation               AM-PAC PT "6 Clicks" Mobility  Outcome Measure Help needed turning from your back to your side while in a flat bed without using bedrails?: None Help needed moving from lying on your back to sitting on the side of a flat bed without using bedrails?: A Little Help needed moving to and from a bed to a chair (including a wheelchair)?: A Little Help needed standing up from a chair using your arms (e.g., wheelchair or bedside chair)?: A Little Help needed to walk in hospital room?: A Little Help needed climbing 3-5 steps with a railing? : A Little 6 Click Score: 19    End of Session Equipment Utilized During Treatment: Gait belt Activity Tolerance: Patient tolerated treatment well Patient left: in bed;with call bell/phone within reach;with family/visitor present Nurse Communication: Mobility status PT Visit Diagnosis: Difficulty in walking, not elsewhere classified (R26.2);Pain Pain - Right/Left: Left Pain - part of body: Leg    Time: 1610-9604 PT Time Calculation (min) (ACUTE ONLY): 22 min   Charges:   PT Evaluation $PT Eval Moderate Complexity: 1 Mod          Ferd Glassing., PT  Office # [phone]   Ilda Foil 03/05/2023, 1:08 PM

## 2023-03-06 ENCOUNTER — Other Ambulatory Visit (HOSPITAL_COMMUNITY): Payer: Self-pay

## 2023-03-06 ENCOUNTER — Ambulatory Visit: Payer: 59

## 2023-03-06 DIAGNOSIS — J4540 Moderate persistent asthma, uncomplicated: Secondary | ICD-10-CM | POA: Diagnosis not present

## 2023-03-06 DIAGNOSIS — L03116 Cellulitis of left lower limb: Secondary | ICD-10-CM | POA: Diagnosis not present

## 2023-03-06 DIAGNOSIS — I82412 Acute embolism and thrombosis of left femoral vein: Secondary | ICD-10-CM | POA: Diagnosis not present

## 2023-03-06 DIAGNOSIS — R4182 Altered mental status, unspecified: Secondary | ICD-10-CM | POA: Diagnosis not present

## 2023-03-06 LAB — GLUCOSE, CAPILLARY
Glucose-Capillary: 135 mg/dL — ABNORMAL HIGH (ref 70–99)
Glucose-Capillary: 130 mg/dL — ABNORMAL HIGH (ref 70–99)
Glucose-Capillary: 138 mg/dL — ABNORMAL HIGH (ref 70–99)
Glucose-Capillary: 133 mg/dL — ABNORMAL HIGH (ref 70–99)

## 2023-03-06 LAB — CBC
HCT: 37.5 % (ref 36.0–46.0)
Hemoglobin: 12 g/dL (ref 12.0–15.0)
MCH: 31.4 pg (ref 26.0–34.0)
MCHC: 32 g/dL (ref 30.0–36.0)
MCV: 98.2 fL (ref 80.0–100.0)
Platelets: 195 10*3/uL (ref 150–400)
RBC: 3.82 MIL/uL — ABNORMAL LOW (ref 3.87–5.11)
RDW: 13.7 % (ref 11.5–15.5)
WBC: 15.1 10*3/uL — ABNORMAL HIGH (ref 4.0–10.5)
nRBC: 0 % (ref 0.0–0.2)

## 2023-03-06 LAB — BASIC METABOLIC PANEL
Anion gap: 7 (ref 5–15)
BUN: 9 mg/dL (ref 6–20)
CO2: 25 mmol/L (ref 22–32)
Calcium: 8.2 mg/dL — ABNORMAL LOW (ref 8.9–10.3)
Chloride: 105 mmol/L (ref 98–111)
Creatinine, Ser: 0.82 mg/dL (ref 0.44–1.00)
GFR, Estimated: >60 mL/min (ref >60)
Glucose, Bld: 93 mg/dL (ref 70–99)
Potassium: 4.6 mmol/L (ref 3.5–5.1)
Sodium: 137 mmol/L (ref 135–145)

## 2023-03-06 LAB — CULTURE, BLOOD (ROUTINE X 2)

## 2023-03-06 LAB — MAGNESIUM: Magnesium: 1.7 mg/dL (ref 1.7–2.4)

## 2023-03-06 NOTE — TOC Benefit Eligibility Note (Signed)
Pharmacy Patient Advocate Encounter  Insurance verification completed.    The patient is insured through [employer] test claim for Eliquis 5 mg and the current 30 day co-pay is $75.00.  Ran test claim for Xarelto 20 mg and the current 30 day co-pay is $75.00.   This test claim was processed through [facility]- copay amounts may vary at other pharmacies due to pharmacy/plan contracts, or as the patient moves through the different stages of their insurance plan.    Amy Molina, CPHT Pharmacy Patient Advocate Specialist [facility] Patient Advocate Team Direct Number: [phone]  Fax: [phone]

## 2023-03-06 NOTE — Evaluation (Signed)
Occupational Therapy Evaluation Patient Details Name: Amy Molina MRN: [id number] DOB: [birth year] Today's Date: 03/06/2023   History of Present Illness Pt is a 62 y.o. female admitted 6/22 with LLE DVT and cellulitis. PMH: DM-2, HTN, HLD, asthma, chronic venous stasis BLE, morbid obesity   Clinical Impression   Pt admitted with the above diagnosis and has the deficits listed below. Pt would benefit from acute OT to increase independence level of adls from min assist to mod I which is patient's baseline. Pt cares for herself and works night shift and generally needs to assist at home.  Feel pt will not need post acute OT.      Recommendations for follow up therapy are one component of a multi-disciplinary discharge planning process, led by the attending physician.  Recommendations may be updated based on patient status, additional functional criteria and insurance authorization.   Assistance Recommended at Discharge PRN  Patient can return home with the following A little help with bathing/dressing/bathroom;Assistance with cooking/housework;Assist for transportation    Functional Status Assessment  Patient has had a recent decline in their functional status and demonstrates the ability to make significant improvements in function in a reasonable and predictable amount of time.  Equipment Recommendations  Other (comment) (may need walker if pain persists)    Recommendations for Other Services       Precautions / Restrictions Precautions Precautions: None Restrictions Weight Bearing Restrictions: No      Mobility Bed Mobility Overal bed mobility: Modified Independent             General bed mobility comments: +rail, increased time    Transfers Overall transfer level: Needs assistance Equipment used: Rolling walker (2 wheels) Transfers: Sit to/from Stand Sit to Stand: Supervision           General transfer comment: supervision for line management      Balance  Overall balance assessment: Mild deficits observed, not formally tested                                         ADL either performed or assessed with clinical judgement   ADL Overall ADL's : Needs assistance/impaired Eating/Feeding: Independent;Sitting   Grooming: Supervision/safety;Standing;Wash/dry hands;Wash/dry face;Oral care   Upper Body Bathing: Set up;Sitting   Lower Body Bathing: Minimal assistance;Sit to/from stand;Cueing for compensatory techniques   Upper Body Dressing : Set up;Sitting   Lower Body Dressing: Moderate assistance;Sit to/from stand;Cueing for compensatory techniques Lower Body Dressing Details (indicate cue type and reason): could benefit from sock aid Toilet Transfer: Supervision/safety;Ambulation;Comfort height toilet;Grab bars   Toileting- Clothing Manipulation and Hygiene: Supervision/safety;Sit to/from stand;Cueing for compensatory techniques       Functional mobility during ADLs: Supervision/safety;Rolling walker (2 wheels) General ADL Comments: Pt used rolling walker for     Vision Baseline Vision/History: 0 No visual deficits Ability to See in Adequate Light: 0 Adequate Patient Visual Report: No change from baseline Vision Assessment?: No apparent visual deficits     Perception Perception Perception Tested?: No   Praxis Praxis Praxis tested?: Within functional limits    Pertinent Vitals/Pain Pain Assessment Pain Assessment: Faces Faces Pain Scale: Hurts little more Pain Location: LLE Pain Descriptors / Indicators: Discomfort, Tender, Sore Pain Intervention(s): Limited activity within patient's tolerance, Monitored during session, Repositioned     Hand Dominance Right   Extremity/Trunk Assessment Upper Extremity Assessment Upper Extremity Assessment: Overall WFL for tasks assessed  Lower Extremity Assessment Lower Extremity Assessment: Defer to PT evaluation   Cervical / Trunk Assessment Cervical / Trunk  Assessment: Normal   Communication Communication Communication: No difficulties   Cognition Arousal/Alertness: Awake/alert Behavior During Therapy: WFL for tasks assessed/performed Overall Cognitive Status: Within Functional Limits for tasks assessed                                       General Comments  Pt used walker this am due to pain in RLE with weight bearing.    Exercises     Shoulder Instructions      Home Living Family/patient expects to be discharged to:: Private residence Living Arrangements: Other relatives Available Help at Discharge: Family;Available PRN/intermittently Type of Home: House Home Access: Stairs to enter [employer] of Steps: 3 Entrance Stairs-Rails: None Home Layout: One level     Bathroom Shower/Tub: Chief Strategy Officer: Handicapped height     Home Equipment: Cane - single point;Shower seat - built in;Grab bars - toilet          Prior Functioning/Environment Prior Level of Function : Independent/Modified Independent;Working/employed;Driving             Mobility Comments: works at [employer] full time (3rd shift, 12 hours) ADLs Comments: no assist PTA        OT Problem List: Impaired balance (sitting and/or standing);Decreased safety awareness;Pain      OT Treatment/Interventions: Self-care/ADL training;Energy conservation;Therapeutic activities    OT Goals(Current goals can be found in the care plan section) Acute Rehab OT Goals Patient Stated Goal: to get back to my normal and get to work OT Goal Formulation: With patient Time For Goal Achievement: 03/20/23 Potential to Achieve Goals: Good ADL Goals Additional ADL Goal #1: Pt will walk to bathroom and complete all toileting with mod I Additional ADL Goal #2: Pt will gather all clothes and dress self with mod I using sock aid if necessary. Additional ADL Goal #3: Pt will state two things she can do at home or at work that will conserve  energy during adls or work tasks with no cues.  OT Frequency: Min 1X/week    Co-evaluation              AM-PAC OT "6 Clicks" Daily Activity     Outcome Measure Help from another person eating meals?: None Help from another person taking care of personal grooming?: None Help from another person toileting, which includes using toliet, bedpan, or urinal?: None Help from another person bathing (including washing, rinsing, drying)?: A Little Help from another person to put on and taking off regular upper body clothing?: None Help from another person to put on and taking off regular lower body clothing?: A Little 6 Click Score: 22   End of Session Equipment Utilized During Treatment: Rolling walker (2 wheels) Nurse Communication: Mobility status  Activity Tolerance: Patient tolerated treatment well Patient left: in chair;with call bell/phone within reach;with family/visitor present  OT Visit Diagnosis: Unsteadiness on feet (R26.81)                Time: 8469-6295 OT Time Calculation (min): 32 min Charges:  OT General Charges $OT Visit: 1 Visit OT Evaluation $OT Eval Low Complexity: 1 Low OT Treatments $Self Care/Home Management : 8-22 mins  Hope Budds 03/06/2023, 8:25 AM

## 2023-03-06 NOTE — Progress Notes (Signed)
PROGRESS NOTE        PATIENT DETAILS Name: Amy Molina Age: 62 y.o. Sex: female Date of Birth: July 05, 1961 Admit Date: 03/04/2023 Admitting Physician Kendell Bane, MD XLK:[id number], Cala Bradford, NP  Brief Summary: Patient is a 62 y.o.  female with history of DM-2, HTN, HLD, asthma, chronic venous stasis bilateral lower extremities, morbid obesity who presented with LLE extremity redness/swelling-some confusion/dizziness.  Evaluated by [facility]-subsequently transferred to [facility] for further evaluation/treatment.  Significant events: 6/22>> admit to [facility]  Significant studies: 6/22>> x-ray left ankle: No fracture 6/22>> Doppler ultrasound LLE: Positive for DVT involving femoral/popliteal/posterior tibial vein 6/22>> MRI brain: No acute infarct 6/23>> CT head: No acute findings  Significant microbiology data: 6/22>> blood culture: No growth  Procedures: None  Consults: None  Subjective: Left leg still swollen-but less compared to yesterday-remains erythematous.  Objective: Vitals: Blood pressure 108/67, pulse 81, temperature 98.2 F (36.8 C), temperature source Oral, resp. rate 19, last menstrual period 08/09/2016, SpO2 94 %.   Exam: Gen Exam:Alert awake-not in any distress HEENT:atraumatic, normocephalic Chest: B/L clear to auscultation anteriorly CVS:S1S2 regular Abdomen:soft non tender, non distended Extremities: LLE-erythematous-slight decrease in swelling. Neurology: Non focal Skin: no rash  Pertinent Labs/Radiology:    Latest Ref Rng & Units 03/06/2023    4:38 AM 03/05/2023    4:56 AM 03/04/2023   11:35 AM  CBC  WBC 4.0 - 10.5 K/uL 15.1  22.2  32.2   Hemoglobin 12.0 - 15.0 g/dL 53.6  64.4  03.4   Hematocrit 36.0 - 46.0 % 37.5  35.8  40.6   Platelets 150 - 400 K/uL 195  182  201     Lab Results  Component Value Date   NA 137 03/06/2023   K 4.6 03/06/2023   CL 105 03/06/2023   CO2 25 03/06/2023      Assessment/Plan: SIRS  secondary to superimposed cellulitis on LLE DVT Improving-however still with significant LLE swelling/erythema Continue cefazolin Anticoagulation started 6/23 Elevate leg  LLE DVT Unprovoked Eliquis Outpatient hematology evaluation Given ongoing swelling/erythema-have asked vascular surgery to evaluate-no common femoral vein involvement-not sure if this patient would be a candidate for catheter guided lytic therapy/thrombectomy.  Acute metabolic encephalopathy Improved-completely awake/alert-back to baseline.  Suspect this was from soft tissue infection/cellulitis Neuroimaging negative.  Hypokalemia Repleted  DM-2 (A1c 5.3 on 6/22) CBG stable SSI  Recent Labs    03/05/23 1650 03/05/23 2129 03/06/23 0813  GLUCAP 148* 128* 135*     HLD Statin  HTN BP stable without the use of any antihypertensives-resume losartan when able.  Bronchial asthma (on Fasenra) Not in exacerbation Bronchodilators  GERD PPI  Mood disorder Seems stable As needed Xanax Lexapro  Morbid Obesity: Estimated body mass index is 52.75 kg/m as calculated from the following:   Height as of 10/05/22: 5\' 5"  (1.651 m).   Weight as of 10/05/22: 143.8 kg.   Code status:   Code Status: Full Code   DVT Prophylaxis: TED hose Start: 03/04/23 1148 SCDs Start: 03/04/23 1148 apixaban (ELIQUIS) tablet 10 mg  apixaban (ELIQUIS) tablet 5 mg    Family Communication: Sister at bedside   Disposition Plan: Status is: Inpatient Remains inpatient appropriate because: Severity of illness   Planned Discharge Destination:Home   Diet: Diet Order             Diet Carb Modified Fluid consistency: Thin; Room  service appropriate? Yes  Diet effective now                     Antimicrobial agents: Anti-infectives (From admission, onward)    Start     Dose/Rate Route Frequency Ordered Stop   03/05/23 1400  ceFAZolin (ANCEF) IVPB 2g/100 mL premix        2 g 200 mL/hr over 30 Minutes Intravenous  Every 8 hours 03/05/23 0920     03/04/23 1400  clindamycin (CLEOCIN) IVPB 600 mg  Status:  Discontinued        600 mg 100 mL/hr over 30 Minutes Intravenous Every 8 hours 03/04/23 1209 03/05/23 0920   03/04/23 1045  cefTRIAXone (ROCEPHIN) 2 g in sodium chloride 0.9 % 100 mL IVPB        2 g 200 mL/hr over 30 Minutes Intravenous  Once 03/04/23 1040 03/04/23 1147        MEDICATIONS: Scheduled Meds:  acidophilus  2 capsule Oral TID   apixaban  10 mg Oral BID   Followed by   Melene Muller ON 03/12/2023] apixaban  5 mg Oral BID   escitalopram  20 mg Oral Daily   fluticasone furoate-vilanterol  1 puff Inhalation Daily   insulin aspart  0-15 Units Subcutaneous TID WC   pantoprazole  40 mg Oral Daily   sodium chloride flush  3 mL Intravenous Q12H   Continuous Infusions:   ceFAZolin (ANCEF) IV 2 g (03/06/23 0500)   PRN Meds:.acetaminophen **OR** acetaminophen, ALPRAZolam, bisacodyl, hydrALAZINE, HYDROmorphone (DILAUDID) injection, ipratropium, levalbuterol, ondansetron **OR** ondansetron (ZOFRAN) IV, oxyCODONE, senna-docusate, traZODone   I have personally reviewed following labs and imaging studies  LABORATORY DATA: CBC: Recent Labs  Lab 03/04/23 1135 03/05/23 0456 03/06/23 0438  WBC 32.2* 22.2* 15.1*  NEUTROABS 27.4*  --   --   HGB 13.5 11.9* 12.0  HCT 40.6 35.8* 37.5  MCV 94.0 95.7 98.2  PLT 201 182 195     Basic Metabolic Panel: Recent Labs  Lab 03/04/23 1135 03/04/23 1249 03/05/23 0456 03/06/23 0438  NA 134*  --  135 137  K 3.1*  --  2.9* 4.6  CL 99  --  103 105  CO2 23  --  24 25  GLUCOSE 147*  --  116* 93  BUN 13  --  11 9  CREATININE 0.96  --  0.97 0.82  CALCIUM 8.0*  --  7.7* 8.2*  MG  --  1.2*  --  1.7  PHOS  --  2.5  --   --      GFR: CrCl cannot be calculated (Unknown ideal weight.).  Liver Function Tests: Recent Labs  Lab 03/04/23 1135  AST 30  ALT 17  ALKPHOS 65  BILITOT 1.8*  PROT 7.3  ALBUMIN 3.4*    No results for input(s): "LIPASE",  "AMYLASE" in the last 168 hours. No results for input(s): "AMMONIA" in the last 168 hours.  Coagulation Profile: Recent Labs  Lab 03/04/23 1135  INR 1.3*     Cardiac Enzymes: No results for input(s): "CKTOTAL", "CKMB", "CKMBINDEX", "TROPONINI" in the last 168 hours.  BNP (last 3 results) No results for input(s): "PROBNP" in the last 8760 hours.  Lipid Profile: No results for input(s): "CHOL", "HDL", "LDLCALC", "TRIG", "CHOLHDL", "LDLDIRECT" in the last 72 hours.  Thyroid Function Tests: Recent Labs    03/04/23 1135  TSH 1.587     Anemia Panel: Recent Labs    03/04/23 1407  VITAMINB12 262  Urine analysis:    Component Value Date/Time   COLORURINE AMBER (A) 03/04/2023 1210   APPEARANCEUR HAZY (A) 03/04/2023 1210   LABSPEC 1.017 03/04/2023 1210   PHURINE 5.0 03/04/2023 1210   GLUCOSEU NEGATIVE 03/04/2023 1210   GLUCOSEU NEGATIVE 07/18/2018 1102   HGBUR NEGATIVE 03/04/2023 1210   BILIRUBINUR NEGATIVE 03/04/2023 1210   KETONESUR NEGATIVE 03/04/2023 1210   PROTEINUR 30 (A) 03/04/2023 1210   UROBILINOGEN 0.2 07/18/2018 1102   NITRITE NEGATIVE 03/04/2023 1210   LEUKOCYTESUR NEGATIVE 03/04/2023 1210    Sepsis Labs: Lactic Acid, Venous    Component Value Date/Time   LATICACIDVEN 1.7 03/05/2023 0908    MICROBIOLOGY: Recent Results (from the past 240 hour(s))  Culture, blood (Routine X 2) w Reflex to ID Panel     Status: None (Preliminary result)   Collection Time: 03/04/23 12:49 PM   Specimen: Right Antecubital; Blood  Result Value Ref Range Status   Specimen Description   Final    RIGHT ANTECUBITAL BOTTLES DRAWN AEROBIC AND ANAEROBIC   Special Requests Blood Culture adequate volume  Final   Culture   Final    NO GROWTH 2 DAYS Performed at [facility], [address] [place], [place] [address]    Report Status PENDING  Incomplete  Culture, blood (Routine X 2) w Reflex to ID Panel     Status: None (Preliminary result)   Collection Time: 03/04/23  12:49 PM   Specimen: Left Antecubital; Blood  Result Value Ref Range Status   Specimen Description   Final    LEFT ANTECUBITAL BOTTLES DRAWN AEROBIC AND ANAEROBIC   Special Requests Blood Culture adequate volume  Final   Culture   Final    NO GROWTH 2 DAYS Performed at [facility], [address], [place], [place] [address]    Report Status PENDING  Incomplete    RADIOLOGY STUDIES/RESULTS: ECHOCARDIOGRAM COMPLETE  Result Date: 03/05/2023    ECHOCARDIOGRAM REPORT   Patient Name:   Bettey Costa Date of Exam: 03/05/2023 Medical Rec #:  [id number]      Height:       65.0 in Accession #:    [id number]     Weight:       317.0 lb Date of Birth:  [birth year]      BSA:          2.406 m Patient Age:    61 years       BP:           106/47 mmHg Patient Gender: F              HR:           77 bpm. Exam Location:  Inpatient Procedure: 2D Echo, Cardiac Doppler and Color Doppler Indications:    Stroke  History:        Patient has no prior history of Echocardiogram examinations.                 Signs/Symptoms:Murmur; Risk Factors:Diabetes and Hypertension.  Sonographer:    Darlys Gales Referring Phys: [phone] SEYED A SHAHMEHDI IMPRESSIONS  1. Left ventricular ejection fraction, by estimation, is 60 to 65%. The left ventricle has normal function. The left ventricle has no regional wall motion abnormalities. There is mild concentric left ventricular hypertrophy. Left ventricular diastolic parameters are consistent with Grade II diastolic dysfunction (pseudonormalization). Elevated left atrial pressure.  2. Right ventricular systolic function is normal. The right ventricular size is normal. Tricuspid regurgitation signal is inadequate for assessing PA  pressure.  3. The mitral valve is normal in structure. No evidence of mitral valve regurgitation. No evidence of mitral stenosis. Moderate mitral annular calcification.  4. The aortic valve is tricuspid. Aortic valve regurgitation is not visualized. No aortic stenosis is  present.  5. The inferior vena cava is dilated in size with <50% respiratory variability, suggesting right atrial pressure of 15 mmHg. FINDINGS  Left Ventricle: Left ventricular ejection fraction, by estimation, is 60 to 65%. The left ventricle has normal function. The left ventricle has no regional wall motion abnormalities. The left ventricular internal cavity size was normal in size. There is  mild concentric left ventricular hypertrophy. Left ventricular diastolic parameters are consistent with Grade II diastolic dysfunction (pseudonormalization). Elevated left atrial pressure. Right Ventricle: The right ventricular size is normal. No increase in right ventricular wall thickness. Right ventricular systolic function is normal. Tricuspid regurgitation signal is inadequate for assessing PA pressure. Left Atrium: Left atrial size was normal in size. Right Atrium: Right atrial size was normal in size. Pericardium: There is no evidence of pericardial effusion. Mitral Valve: The mitral valve is normal in structure. Moderate mitral annular calcification. No evidence of mitral valve regurgitation. No evidence of mitral valve stenosis. Tricuspid Valve: The tricuspid valve is normal in structure. Tricuspid valve regurgitation is not demonstrated. Aortic Valve: The aortic valve is tricuspid. Aortic valve regurgitation is not visualized. No aortic stenosis is present. Aortic valve mean gradient measures 7.0 mmHg. Aortic valve peak gradient measures 9.5 mmHg. Aortic valve area, by VTI measures 2.58 cm. Pulmonic Valve: The pulmonic valve was not well visualized. Pulmonic valve regurgitation is not visualized. No evidence of pulmonic stenosis. Aorta: The aortic root and ascending aorta are structurally normal, with no evidence of dilitation. Venous: The inferior vena cava is dilated in size with less than 50% respiratory variability, suggesting right atrial pressure of 15 mmHg. IAS/Shunts: The interatrial septum was not well  visualized.  LEFT VENTRICLE PLAX 2D LVIDd:         5.10 cm   Diastology LVIDs:         2.90 cm   LV e' medial:    7.51 cm/s LV PW:         1.20 cm   LV E/e' medial:  17.0 LV IVS:        1.10 cm   LV e' lateral:   6.31 cm/s LVOT diam:     1.90 cm   LV E/e' lateral: 20.3 LV SV:         88 LV SV Index:   36 LVOT Area:     2.84 cm  RIGHT VENTRICLE             IVC RV S prime:     15.20 cm/s  IVC diam: 2.70 cm TAPSE (M-mode): 3.4 cm LEFT ATRIUM             Index        RIGHT ATRIUM           Index LA Vol (A2C):   35.0 ml 14.55 ml/m  RA Area:     17.90 cm LA Vol (A4C):   60.3 ml 25.06 ml/m  RA Volume:   44.80 ml  18.62 ml/m LA Biplane Vol: 47.0 ml 19.54 ml/m  AORTIC VALVE AV Area (Vmax):    2.52 cm AV Area (Vmean):   2.32 cm AV Area (VTI):     2.58 cm AV Vmax:  154.00 cm/s AV Vmean:          126.000 cm/s AV VTI:            0.339 m AV Peak Grad:      9.5 mmHg AV Mean Grad:      7.0 mmHg LVOT Vmax:         137.00 cm/s LVOT Vmean:        103.000 cm/s LVOT VTI:          0.309 m LVOT/AV VTI ratio: 0.91  AORTA Ao Root diam: 3.10 cm Ao Asc diam:  3.50 cm MITRAL VALVE MV Area (PHT): 2.55 cm     SHUNTS MV Decel Time: 297 msec     Systemic VTI:  0.31 m MV E velocity: 128.00 cm/s  Systemic Diam: 1.90 cm MV A velocity: 117.00 cm/s MV E/A ratio:  1.09 Mihai Croitoru MD Electronically signed by Thurmon Fair MD Signature Date/Time: 03/05/2023/2:33:43 PM    Final    CT HEAD WO CONTRAST ( )  Result Date: 03/05/2023 CLINICAL DATA:  TIA. EXAM: CT HEAD WITHOUT CONTRAST TECHNIQUE: Contiguous axial images were obtained from the base of the skull through the vertex without intravenous contrast. RADIATION DOSE REDUCTION: This exam was performed according to the departmental dose-optimization program which includes automated exposure control, adjustment of the mA and/or kV according to patient size and/or use of iterative reconstruction technique. COMPARISON:  Brain MRI from yesterday FINDINGS: Brain: No evidence of acute  infarction, hemorrhage, hydrocephalus, extra-axial collection or mass lesion/mass effect. Small chronic left cerebellar infarcts as seen on prior MRI. Cavum velum interpositum. Vascular: No hyperdense vessel or unexpected calcification. Skull: Normal. Negative for fracture or focal lesion. Sinuses/Orbits: No acute finding. IMPRESSION: 1. No acute finding or change from MRI yesterday. 2. Remote left cerebellar infarcts. Electronically Signed   By: Tiburcio Pea M.D.   On: 03/05/2023 04:11   MR BRAIN WO CONTRAST  Result Date: 03/04/2023 CLINICAL DATA:  Neuro deficit, acute, stroke suspected word finding difficulty EXAM: MRI HEAD WITHOUT CONTRAST TECHNIQUE: Multiplanar, multiecho pulse sequences of the brain and surrounding structures were obtained without intravenous contrast. COMPARISON:  CT Head 04/19/14 FINDINGS: Limitations: Motion degraded exam. Brain: Negative for an acute infarct. No hemorrhage. No hydrocephalus. No extra-axial fluid collection. There is chronic infarct in the left cerebellum. Sequela of mild overall chronic microvascular ischemic change. Cavum velum interpositum. Vascular: Normal flow voids. Skull and upper cervical spine: Normal marrow signal. Sinuses/Orbits: No middle ear or mastoid effusion. Mild mucosal thickening in the bilateral ethmoid sinuses. Orbits are unremarkable Other: None. IMPRESSION: Motion degrade exam. 1.  Negative for an acute infarct. 2.  Chronic left cerebellar infarcts. Electronically Signed   By: Lorenza Cambridge M.D.   On: 03/04/2023 17:56   US Venous Img Lower Unilateral Left  Result Date: 03/04/2023 CLINICAL DATA:  Left leg redness EXAM: LEFT LOWER EXTREMITY VENOUS DOPPLER ULTRASOUND TECHNIQUE: Gray-scale sonography with graded compression, as well as color Doppler and duplex ultrasound were performed to evaluate the lower extremity deep venous systems from the level of the common femoral vein and including the common femoral, femoral, profunda femoral, popliteal  and calf veins including the posterior tibial, peroneal and gastrocnemius veins when visible. The superficial great saphenous vein was also interrogated. Spectral Doppler was utilized to evaluate flow at rest and with distal augmentation maneuvers in the common femoral, femoral and popliteal veins. COMPARISON:  02/24/2021 FINDINGS: Contralateral Common Femoral Vein: Respiratory phasicity is normal and symmetric with the symptomatic side. No evidence  of thrombus. Normal compressibility. Common Femoral Vein: No evidence of thrombus. Normal compressibility, respiratory phasicity and response to augmentation. Saphenofemoral Junction: No evidence of thrombus. Normal compressibility and flow on color Doppler imaging. Profunda Femoral Vein: No evidence of thrombus. Normal compressibility and flow on color Doppler imaging. Femoral Vein: Partially occlusive thrombus within the mid left femoral vein. Occlusive thrombus within the distal aspect of the left femoral vein. Popliteal Vein: Occlusive thrombus within the left popliteal vein. Calf Veins: Thrombus is seen extending into the posterior tibial vein. Peroneal veins were not well assessed. Superficial Great Saphenous Vein: No evidence of thrombus. Normal compressibility. Venous Reflux:  None. Other Findings:  None. IMPRESSION: Positive for deep venous thrombosis involving the left femoral, popliteal, and posterior tibial veins. Electronically Signed   By: Duanne Guess D.O.   On: 03/04/2023 11:17     LOS: 2 days   Jeoffrey Massed, MD  [facility]    To contact the attending provider between 7A-7P or the covering provider during after hours 7P-7A, please log into the web site [email or web address] and access using universal [place] password for that web site. If you do not have the password, please call the hospital operator.  03/06/2023, 10:41 AM

## 2023-03-06 NOTE — TOC CM/SW Note (Signed)
Transition of Care [facility]) - Inpatient Brief Assessment   Patient Details  Name: Amy Molina MRN: [id number] Date of Birth: 10-28-1960  Transition of Care [facility]) CM/SW Contact:    Durenda Guthrie, RN Phone Number: 03/06/2023, 2:10 PM   Clinical Narrative:    Transition of Care Asessment: Insurance and Status: Insurance coverage has been reviewed Patient has primary care physician: Yes Mauro Kaufmann NP) Home environment has been reviewed: lives with family Prior level of function:: independent prior Prior/Current Home Services: No current home services Social Determinants of Health Reivew: SDOH reviewed no interventions necessary Readmission risk has been reviewed: Yes Transition of care needs: no transition of care needs at this time

## 2023-03-07 DIAGNOSIS — L03116 Cellulitis of left lower limb: Secondary | ICD-10-CM | POA: Diagnosis not present

## 2023-03-07 DIAGNOSIS — I824Y2 Acute embolism and thrombosis of unspecified deep veins of left proximal lower extremity: Secondary | ICD-10-CM | POA: Diagnosis not present

## 2023-03-07 DIAGNOSIS — R6510 Systemic inflammatory response syndrome (SIRS) of non-infectious origin without acute organ dysfunction: Secondary | ICD-10-CM

## 2023-03-07 LAB — CULTURE, BLOOD (ROUTINE X 2): Special Requests: ADEQUATE

## 2023-03-07 LAB — GLUCOSE, CAPILLARY
Glucose-Capillary: 117 mg/dL — ABNORMAL HIGH (ref 70–99)
Glucose-Capillary: 196 mg/dL — ABNORMAL HIGH (ref 70–99)
Glucose-Capillary: 162 mg/dL — ABNORMAL HIGH (ref 70–99)
Glucose-Capillary: 177 mg/dL — ABNORMAL HIGH (ref 70–99)

## 2023-03-07 LAB — CBC
HCT: 38.7 % (ref 36.0–46.0)
Hemoglobin: 12.5 g/dL (ref 12.0–15.0)
MCH: 30.9 pg (ref 26.0–34.0)
MCHC: 32.3 g/dL (ref 30.0–36.0)
MCV: 95.8 fL (ref 80.0–100.0)
Platelets: 215 10*3/uL (ref 150–400)
RBC: 4.04 MIL/uL (ref 3.87–5.11)
RDW: 13.4 % (ref 11.5–15.5)
WBC: 12.5 10*3/uL — ABNORMAL HIGH (ref 4.0–10.5)
nRBC: 0 % (ref 0.0–0.2)

## 2023-03-07 NOTE — Progress Notes (Signed)
Physical Therapy Treatment Patient Details Name: Amy Molina MRN: [id number] DOB: [birth year] Today's Date: 03/07/2023   History of Present Illness Pt is a 62 y.o. female admitted 6/22 with LLE DVT and cellulitis. PMH: DM-2, HTN, HLD, asthma, chronic venous stasis BLE, morbid obesity.    PT Comments    Pt received in supine, agreeable to therapy session and with good participation and tolerance for transfer, gait and stair training with single point cane. Pt needing min safety/sequencing cues but mostly Supervision, up to min guard assist for stair safety. No buckling or loss of balance, slower speed due to LLE pain. Pt continues to benefit from PT services to progress toward functional mobility goals, anticipate pt will meet PT goals in next 1-2 sessions pending pain tolerance, recommend pt continue to use SPC at home for safety/stability due to LLE pain.    Recommendations for follow up therapy are one component of a multi-disciplinary discharge planning process, led by the attending physician.  Recommendations may be updated based on patient status, additional functional criteria and insurance authorization.  Follow Up Recommendations       Assistance Recommended at Discharge None  Patient can return home with the following Assistance with cooking/housework;Help with stairs or ramp for entrance   Equipment Recommendations  None recommended by PT    Recommendations for Other Services       Precautions / Restrictions Precautions Precautions: None Restrictions Weight Bearing Restrictions: No     Mobility  Bed Mobility Overal bed mobility: Modified Independent             General bed mobility comments: +rail, increased time    Transfers Overall transfer level: Needs assistance Equipment used: Straight cane Transfers: Sit to/from Stand Sit to Stand: Supervision           General transfer comment: supervision for line management     Ambulation/Gait Ambulation/Gait assistance: Supervision Gait Distance (Feet): 200 Feet Assistive device: Straight cane Gait Pattern/deviations: Wide base of support, Step-through pattern, Decreased stride length Gait velocity: grossly <0.4 m/s, slow pace w/a few standing breaks     General Gait Details: steady gait, slower pace due to LLE tenderness and pt guarding at times, pt reports more comfortable with cane in RUE and no overt buckling or LOB. VSS on RA   Stairs Stairs: Yes Stairs assistance: Min guard Stair Management: One rail Right, Step to pattern, With cane, Forwards Number of Stairs: 4 General stair comments: cues for sequencing/safety given LLE pain, no buckling or overt LOB but pt needs increased time to perform (single step up/down x4 reps in stairwell)   Wheelchair Mobility    Modified Rankin (Stroke Patients Only)       Balance Overall balance assessment: Mild deficits observed, not formally tested                                          Cognition Arousal/Alertness: Awake/alert Behavior During Therapy: WFL for tasks assessed/performed Overall Cognitive Status: Within Functional Limits for tasks assessed                                          Exercises Other Exercises Other Exercises: encouraged supine BLE AROM ankle pumps and seated LLE AROM LAQ, pt performed x10 reps ea    General  Comments        Pertinent Vitals/Pain Pain Assessment Pain Assessment: 0-10 Pain Score: 6  Pain Location: LLE with gait trial, HA Pain Descriptors / Indicators: Discomfort, Tender, Sore, Headache Pain Intervention(s): Monitored during session, Premedicated before session, Repositioned    Home Living                          Prior Function            PT Goals (current goals can now be found in the care plan section) Acute Rehab PT Goals Patient Stated Goal: home PT Goal Formulation: With patient Time For Goal  Achievement: 03/19/23 Progress towards PT goals: Progressing toward goals    Frequency    Min 3X/week      PT Plan Current plan remains appropriate    Co-evaluation              AM-PAC PT "6 Clicks" Mobility   Outcome Measure  Help needed turning from your back to your side while in a flat bed without using bedrails?: None Help needed moving from lying on your back to sitting on the side of a flat bed without using bedrails?: None Help needed moving to and from a bed to a chair (including a wheelchair)?: A Little Help needed standing up from a chair using your arms (e.g., wheelchair or bedside chair)?: A Little Help needed to walk in hospital room?: A Little Help needed climbing 3-5 steps with a railing? : A Little 6 Click Score: 20    End of Session Equipment Utilized During Treatment: Gait belt Activity Tolerance: Patient tolerated treatment well Patient left: in chair;with call bell/phone within reach;Other (comment) (pt in bathroom, sister in room and able to assist her out of bathroom for safety PRN) Nurse Communication: Mobility status PT Visit Diagnosis: Difficulty in walking, not elsewhere classified (R26.2);Pain Pain - Right/Left: Left Pain - part of body: Leg     Time: 8295-6213 PT Time Calculation (min) (ACUTE ONLY): 22 min  Charges:  $Gait Training: 8-22 mins                     Draeden Kellman P., PTA Acute Rehabilitation Services Secure Chat Preferred 9a-5:30pm Office: [phone]    [place]us [place] 03/07/2023, 6:31 PM

## 2023-03-07 NOTE — Progress Notes (Signed)
PROGRESS NOTE        PATIENT DETAILS Name: Amy Molina Age: 62 y.o. Sex: female Date of Birth: 27-Nov-1960 Admit Date: 03/04/2023 Admitting Physician Kendell Bane, MD ZOX:[id number], Cala Bradford, NP  Brief Summary: Patient is a 62 y.o.  female with history of DM-2, HTN, HLD, asthma, chronic venous stasis bilateral lower extremities, morbid obesity who presented with LLE extremity redness/swelling-some confusion/dizziness.  Evaluated by [facility]-subsequently transferred to [facility] for further evaluation/treatment.  Significant events: 6/22>> admit to [facility]  Significant studies: 6/22>> x-ray left ankle: No fracture 6/22>> Doppler ultrasound LLE: Positive for DVT involving femoral/popliteal/posterior tibial vein 6/22>> MRI brain: No acute infarct 6/23>> CT head: No acute findings  Significant microbiology data: 6/22>> blood culture: No growth  Procedures: None  Consults: None  Subjective: Left leg less swollen-less erythematous today.   Objective: Vitals: Blood pressure 139/75, pulse 78, temperature 98 F (36.7 C), temperature source Oral, resp. rate (!) 21, weight (!) 138.8 kg, last menstrual period 08/09/2016, SpO2 94 %.   Exam: Gen Exam:Alert awake-not in any distress HEENT:atraumatic, normocephalic Chest: B/L clear to auscultation anteriorly CVS:S1S2 regular Abdomen:soft non tender, non distended Extremities: Leg-less erythema/less swelling. Neurology: Non focal Skin: no rash  Pertinent Labs/Radiology:    Latest Ref Rng & Units 03/07/2023    3:02 AM 03/06/2023    4:38 AM 03/05/2023    4:56 AM  CBC  WBC 4.0 - 10.5 K/uL 12.5  15.1  22.2   Hemoglobin 12.0 - 15.0 g/dL 98.1  19.1  47.8   Hematocrit 36.0 - 46.0 % 38.7  37.5  35.8   Platelets 150 - 400 K/uL 215  195  182     Lab Results  Component Value Date   NA 137 03/06/2023   K 4.6 03/06/2023   CL 105 03/06/2023   CO2 25 03/06/2023      Assessment/Plan: SIRS secondary to superimposed  cellulitis on LLE DVT Overall much better-leukocytosis significantly downtrending-mildly improved-some swelling/erythema persists Continue IV cefazolin for 1 more day before transitioning to oral anticoagulation Anticoagulation started 6/23 Elevate leg  LLE DVT Unprovoked Eliquis started 6/23 Outpatient hematology evaluation Briefly discussed with vascular surgery-Dr. Boris Sharper anticoagulation-no role for thrombectomy.    Acute metabolic encephalopathy Improved-completely awake/alert-back to baseline.  Suspect this was from soft tissue infection/cellulitis Neuroimaging negative.  Hypokalemia Repleted  DM-2 (A1c 5.3 on 6/22) CBG stable SSI  Recent Labs    03/06/23 1644 03/06/23 2131 03/07/23 0757  GLUCAP 138* 133* 117*      HLD Statin  HTN BP stable without the use of any antihypertensives-resume losartan when able.  Bronchial asthma (on Fasenra) Not in exacerbation Bronchodilators  GERD PPI  Mood disorder Seems stable As needed Xanax Lexapro  Morbid Obesity: Estimated body mass index is 50.92 kg/m as calculated from the following:   Height as of 10/05/22: 5\' 5"  (1.651 m).   Weight as of this encounter: 138.8 kg.   Code status:   Code Status: Full Code   DVT Prophylaxis: TED hose Start: 03/04/23 1148 SCDs Start: 03/04/23 1148 apixaban (ELIQUIS) tablet 10 mg  apixaban (ELIQUIS) tablet 5 mg    Family Communication: Sister at bedside   Disposition Plan: Status is: Inpatient Remains inpatient appropriate because: Severity of illness   Planned Discharge Destination:Home hopefully on 6/26 if clinically improved.   Diet: Diet Order  Diet Carb Modified Fluid consistency: Thin; Room service appropriate? Yes  Diet effective now                     Antimicrobial agents: Anti-infectives (From admission, onward)    Start     Dose/Rate Route Frequency Ordered Stop   03/05/23 1400  ceFAZolin (ANCEF) IVPB 2g/100 mL premix         2 g 200 mL/hr over 30 Minutes Intravenous Every 8 hours 03/05/23 0920     03/04/23 1400  clindamycin (CLEOCIN) IVPB 600 mg  Status:  Discontinued        600 mg 100 mL/hr over 30 Minutes Intravenous Every 8 hours 03/04/23 1209 03/05/23 0920   03/04/23 1045  cefTRIAXone (ROCEPHIN) 2 g in sodium chloride 0.9 % 100 mL IVPB        2 g 200 mL/hr over 30 Minutes Intravenous  Once 03/04/23 1040 03/04/23 1147        MEDICATIONS: Scheduled Meds:  acidophilus  2 capsule Oral TID   apixaban  10 mg Oral BID   Followed by   Melene Muller ON 03/12/2023] apixaban  5 mg Oral BID   escitalopram  20 mg Oral Daily   fluticasone furoate-vilanterol  1 puff Inhalation Daily   insulin aspart  0-15 Units Subcutaneous TID WC   pantoprazole  40 mg Oral Daily   sodium chloride flush  3 mL Intravenous Q12H   Continuous Infusions:   ceFAZolin (ANCEF) IV 2 g (03/07/23 0513)   PRN Meds:.acetaminophen **OR** acetaminophen, ALPRAZolam, bisacodyl, hydrALAZINE, HYDROmorphone (DILAUDID) injection, ipratropium, levalbuterol, ondansetron **OR** ondansetron (ZOFRAN) IV, oxyCODONE, senna-docusate, traZODone   I have personally reviewed following labs and imaging studies  LABORATORY DATA: CBC: Recent Labs  Lab 03/04/23 1135 03/05/23 0456 03/06/23 0438 03/07/23 0302  WBC 32.2* 22.2* 15.1* 12.5*  NEUTROABS 27.4*  --   --   --   HGB 13.5 11.9* 12.0 12.5  HCT 40.6 35.8* 37.5 38.7  MCV 94.0 95.7 98.2 95.8  PLT 201 182 195 215     Basic Metabolic Panel: Recent Labs  Lab 03/04/23 1135 03/04/23 1249 03/05/23 0456 03/06/23 0438  NA 134*  --  135 137  K 3.1*  --  2.9* 4.6  CL 99  --  103 105  CO2 23  --  24 25  GLUCOSE 147*  --  116* 93  BUN 13  --  11 9  CREATININE 0.96  --  0.97 0.82  CALCIUM 8.0*  --  7.7* 8.2*  MG  --  1.2*  --  1.7  PHOS  --  2.5  --   --      GFR: Estimated Creatinine Clearance: 104.6 mL/min (by C-G formula based on SCr of 0.82 mg/dL).  Liver Function Tests: Recent Labs   Lab 03/04/23 1135  AST 30  ALT 17  ALKPHOS 65  BILITOT 1.8*  PROT 7.3  ALBUMIN 3.4*    No results for input(s): "LIPASE", "AMYLASE" in the last 168 hours. No results for input(s): "AMMONIA" in the last 168 hours.  Coagulation Profile: Recent Labs  Lab 03/04/23 1135  INR 1.3*     Cardiac Enzymes: No results for input(s): "CKTOTAL", "CKMB", "CKMBINDEX", "TROPONINI" in the last 168 hours.  BNP (last 3 results) No results for input(s): "PROBNP" in the last 8760 hours.  Lipid Profile: No results for input(s): "CHOL", "HDL", "LDLCALC", "TRIG", "CHOLHDL", "LDLDIRECT" in the last 72 hours.  Thyroid Function Tests: Recent Labs  03/04/23 1135  TSH 1.587     Anemia Panel: Recent Labs    03/04/23 1407  VITAMINB12 262     Urine analysis:    Component Value Date/Time   COLORURINE AMBER (A) 03/04/2023 1210   APPEARANCEUR HAZY (A) 03/04/2023 1210   LABSPEC 1.017 03/04/2023 1210   PHURINE 5.0 03/04/2023 1210   GLUCOSEU NEGATIVE 03/04/2023 1210   GLUCOSEU NEGATIVE 07/18/2018 1102   HGBUR NEGATIVE 03/04/2023 1210   BILIRUBINUR NEGATIVE 03/04/2023 1210   KETONESUR NEGATIVE 03/04/2023 1210   PROTEINUR 30 (A) 03/04/2023 1210   UROBILINOGEN 0.2 07/18/2018 1102   NITRITE NEGATIVE 03/04/2023 1210   LEUKOCYTESUR NEGATIVE 03/04/2023 1210    Sepsis Labs: Lactic Acid, Venous    Component Value Date/Time   LATICACIDVEN 1.7 03/05/2023 0908    MICROBIOLOGY: Recent Results (from the past 240 hour(s))  Culture, blood (Routine X 2) w Reflex to ID Panel     Status: None (Preliminary result)   Collection Time: 03/04/23 12:49 PM   Specimen: Right Antecubital; Blood  Result Value Ref Range Status   Specimen Description   Final    RIGHT ANTECUBITAL BOTTLES DRAWN AEROBIC AND ANAEROBIC   Special Requests Blood Culture adequate volume  Final   Culture   Final    NO GROWTH 3 DAYS Performed at [facility], [address] [place], [place] [address]    Report Status PENDING   Incomplete  Culture, blood (Routine X 2) w Reflex to ID Panel     Status: None (Preliminary result)   Collection Time: 03/04/23 12:49 PM   Specimen: Left Antecubital; Blood  Result Value Ref Range Status   Specimen Description   Final    LEFT ANTECUBITAL BOTTLES DRAWN AEROBIC AND ANAEROBIC   Special Requests Blood Culture adequate volume  Final   Culture   Final    NO GROWTH 3 DAYS Performed at [facility], [address] [place], [place] [address]    Report Status PENDING  Incomplete    RADIOLOGY STUDIES/RESULTS: ECHOCARDIOGRAM COMPLETE  Result Date: 03/05/2023    ECHOCARDIOGRAM REPORT   Patient Name:   Bettey Costa Date of Exam: 03/05/2023 Medical Rec #:  [id number]      Height:       65.0 in Accession #:    [id number]     Weight:       317.0 lb Date of Birth:  [birth year]      BSA:          2.406 m Patient Age:    61 years       BP:           106/47 mmHg Patient Gender: F              HR:           77 bpm. Exam Location:  Inpatient Procedure: 2D Echo, Cardiac Doppler and Color Doppler Indications:    Stroke  History:        Patient has no prior history of Echocardiogram examinations.                 Signs/Symptoms:Murmur; Risk Factors:Diabetes and Hypertension.  Sonographer:    Darlys Gales Referring Phys: [phone] SEYED A SHAHMEHDI IMPRESSIONS  1. Left ventricular ejection fraction, by estimation, is 60 to 65%. The left ventricle has normal function. The left ventricle has no regional wall motion abnormalities. There is mild concentric left ventricular hypertrophy. Left ventricular diastolic parameters are consistent with Grade II diastolic dysfunction (pseudonormalization). Elevated  left atrial pressure.  2. Right ventricular systolic function is normal. The right ventricular size is normal. Tricuspid regurgitation signal is inadequate for assessing PA pressure.  3. The mitral valve is normal in structure. No evidence of mitral valve regurgitation. No evidence of mitral stenosis. Moderate mitral  annular calcification.  4. The aortic valve is tricuspid. Aortic valve regurgitation is not visualized. No aortic stenosis is present.  5. The inferior vena cava is dilated in size with <50% respiratory variability, suggesting right atrial pressure of 15 mmHg. FINDINGS  Left Ventricle: Left ventricular ejection fraction, by estimation, is 60 to 65%. The left ventricle has normal function. The left ventricle has no regional wall motion abnormalities. The left ventricular internal cavity size was normal in size. There is  mild concentric left ventricular hypertrophy. Left ventricular diastolic parameters are consistent with Grade II diastolic dysfunction (pseudonormalization). Elevated left atrial pressure. Right Ventricle: The right ventricular size is normal. No increase in right ventricular wall thickness. Right ventricular systolic function is normal. Tricuspid regurgitation signal is inadequate for assessing PA pressure. Left Atrium: Left atrial size was normal in size. Right Atrium: Right atrial size was normal in size. Pericardium: There is no evidence of pericardial effusion. Mitral Valve: The mitral valve is normal in structure. Moderate mitral annular calcification. No evidence of mitral valve regurgitation. No evidence of mitral valve stenosis. Tricuspid Valve: The tricuspid valve is normal in structure. Tricuspid valve regurgitation is not demonstrated. Aortic Valve: The aortic valve is tricuspid. Aortic valve regurgitation is not visualized. No aortic stenosis is present. Aortic valve mean gradient measures 7.0 mmHg. Aortic valve peak gradient measures 9.5 mmHg. Aortic valve area, by VTI measures 2.58 cm. Pulmonic Valve: The pulmonic valve was not well visualized. Pulmonic valve regurgitation is not visualized. No evidence of pulmonic stenosis. Aorta: The aortic root and ascending aorta are structurally normal, with no evidence of dilitation. Venous: The inferior vena cava is dilated in size with less  than 50% respiratory variability, suggesting right atrial pressure of 15 mmHg. IAS/Shunts: The interatrial septum was not well visualized.  LEFT VENTRICLE PLAX 2D LVIDd:         5.10 cm   Diastology LVIDs:         2.90 cm   LV e' medial:    7.51 cm/s LV PW:         1.20 cm   LV E/e' medial:  17.0 LV IVS:        1.10 cm   LV e' lateral:   6.31 cm/s LVOT diam:     1.90 cm   LV E/e' lateral: 20.3 LV SV:         88 LV SV Index:   36 LVOT Area:     2.84 cm  RIGHT VENTRICLE             IVC RV S prime:     15.20 cm/s  IVC diam: 2.70 cm TAPSE (M-mode): 3.4 cm LEFT ATRIUM             Index        RIGHT ATRIUM           Index LA Vol (A2C):   35.0 ml 14.55 ml/m  RA Area:     17.90 cm LA Vol (A4C):   60.3 ml 25.06 ml/m  RA Volume:   44.80 ml  18.62 ml/m LA Biplane Vol: 47.0 ml 19.54 ml/m  AORTIC VALVE AV Area (Vmax):    2.52 cm AV Area (Vmean):  2.32 cm AV Area (VTI):     2.58 cm AV Vmax:           154.00 cm/s AV Vmean:          126.000 cm/s AV VTI:            0.339 m AV Peak Grad:      9.5 mmHg AV Mean Grad:      7.0 mmHg LVOT Vmax:         137.00 cm/s LVOT Vmean:        103.000 cm/s LVOT VTI:          0.309 m LVOT/AV VTI ratio: 0.91  AORTA Ao Root diam: 3.10 cm Ao Asc diam:  3.50 cm MITRAL VALVE MV Area (PHT): 2.55 cm     SHUNTS MV Decel Time: 297 msec     Systemic VTI:  0.31 m MV E velocity: 128.00 cm/s  Systemic Diam: 1.90 cm MV A velocity: 117.00 cm/s MV E/A ratio:  1.09 Mihai Croitoru MD Electronically signed by Thurmon Fair MD Signature Date/Time: 03/05/2023/2:33:43 PM    Final      LOS: 3 days   Jeoffrey Massed, MD  [facility]    To contact the attending provider between 7A-7P or the covering provider during after hours 7P-7A, please log into the web site [email or web address] and access using universal [place] password for that web site. If you do not have the password, please call the hospital operator.  03/07/2023, 10:58 AM

## 2023-03-08 ENCOUNTER — Other Ambulatory Visit (HOSPITAL_COMMUNITY): Payer: Self-pay

## 2023-03-08 ENCOUNTER — Inpatient Hospital Stay: Payer: 59 | Attending: Hematology | Admitting: Hematology

## 2023-03-08 VITALS — BP 138/72 | HR 76 | Temp 97.9°F | Resp 18 | Ht 65.0 in | Wt 299.3 lb

## 2023-03-08 DIAGNOSIS — E669 Obesity, unspecified: Secondary | ICD-10-CM | POA: Insufficient documentation

## 2023-03-08 DIAGNOSIS — Z8249 Family history of ischemic heart disease and other diseases of the circulatory system: Secondary | ICD-10-CM | POA: Insufficient documentation

## 2023-03-08 DIAGNOSIS — I82442 Acute embolism and thrombosis of left tibial vein: Secondary | ICD-10-CM | POA: Insufficient documentation

## 2023-03-08 DIAGNOSIS — E1169 Type 2 diabetes mellitus with other specified complication: Secondary | ICD-10-CM | POA: Diagnosis not present

## 2023-03-08 DIAGNOSIS — Z833 Family history of diabetes mellitus: Secondary | ICD-10-CM | POA: Insufficient documentation

## 2023-03-08 DIAGNOSIS — I82432 Acute embolism and thrombosis of left popliteal vein: Secondary | ICD-10-CM | POA: Diagnosis not present

## 2023-03-08 DIAGNOSIS — K219 Gastro-esophageal reflux disease without esophagitis: Secondary | ICD-10-CM | POA: Insufficient documentation

## 2023-03-08 DIAGNOSIS — Z803 Family history of malignant neoplasm of breast: Secondary | ICD-10-CM | POA: Insufficient documentation

## 2023-03-08 DIAGNOSIS — I82412 Acute embolism and thrombosis of left femoral vein: Secondary | ICD-10-CM | POA: Insufficient documentation

## 2023-03-08 DIAGNOSIS — Z825 Family history of asthma and other chronic lower respiratory diseases: Secondary | ICD-10-CM | POA: Insufficient documentation

## 2023-03-08 DIAGNOSIS — L03116 Cellulitis of left lower limb: Secondary | ICD-10-CM | POA: Diagnosis not present

## 2023-03-08 DIAGNOSIS — I10 Essential (primary) hypertension: Secondary | ICD-10-CM

## 2023-03-08 DIAGNOSIS — Z8051 Family history of malignant neoplasm of kidney: Secondary | ICD-10-CM | POA: Insufficient documentation

## 2023-03-08 DIAGNOSIS — I824Y2 Acute embolism and thrombosis of unspecified deep veins of left proximal lower extremity: Secondary | ICD-10-CM

## 2023-03-08 DIAGNOSIS — Z83719 Family history of colon polyps, unspecified: Secondary | ICD-10-CM | POA: Insufficient documentation

## 2023-03-08 DIAGNOSIS — M25572 Pain in left ankle and joints of left foot: Secondary | ICD-10-CM | POA: Insufficient documentation

## 2023-03-08 DIAGNOSIS — R0609 Other forms of dyspnea: Secondary | ICD-10-CM | POA: Insufficient documentation

## 2023-03-08 DIAGNOSIS — Z840 Family history of diseases of the skin and subcutaneous tissue: Secondary | ICD-10-CM | POA: Insufficient documentation

## 2023-03-08 DIAGNOSIS — J45909 Unspecified asthma, uncomplicated: Secondary | ICD-10-CM | POA: Insufficient documentation

## 2023-03-08 DIAGNOSIS — E119 Type 2 diabetes mellitus without complications: Secondary | ICD-10-CM | POA: Insufficient documentation

## 2023-03-08 DIAGNOSIS — Z79899 Other long term (current) drug therapy: Secondary | ICD-10-CM | POA: Insufficient documentation

## 2023-03-08 LAB — GLUCOSE, CAPILLARY: Glucose-Capillary: 105 mg/dL — ABNORMAL HIGH (ref 70–99)

## 2023-03-08 LAB — CBC
HCT: 42.2 % (ref 36.0–46.0)
Hemoglobin: 13.7 g/dL (ref 12.0–15.0)
MCH: 30.6 pg (ref 26.0–34.0)
MCHC: 32.5 g/dL (ref 30.0–36.0)
MCV: 94.4 fL (ref 80.0–100.0)
Platelets: 288 10*3/uL (ref 150–400)
RBC: 4.47 MIL/uL (ref 3.87–5.11)
RDW: 13.4 % (ref 11.5–15.5)
WBC: 13.3 10*3/uL — ABNORMAL HIGH (ref 4.0–10.5)
nRBC: 0 % (ref 0.0–0.2)

## 2023-03-08 LAB — CULTURE, BLOOD (ROUTINE X 2): Special Requests: ADEQUATE

## 2023-03-08 MED ORDER — CEPHALEXIN 500 MG PO CAPS
1000.0000 mg | ORAL_CAPSULE | Freq: Three times a day (TID) | ORAL | 0 refills | Status: AC
  Filled 2023-03-08: qty 24, 4d supply, fill #0

## 2023-03-08 MED ORDER — APIXABAN 5 MG PO TABS
ORAL_TABLET | ORAL | 3 refills | Status: DC
  Filled 2023-03-08: qty 68, 30d supply, fill #0

## 2023-03-08 NOTE — TOC Transition Note (Signed)
Transition of Care [facility]) - CM/SW Discharge Note   Patient Details  Name: Amy Molina MRN: [id number] Date of Birth: [birth year]  Transition of Care [facility]) CM/SW Contact:  Gordy Clement, RN Phone Number: 03/08/2023, 8:53 AM   Clinical Narrative:     Patient to dc to home today. Eliquis card will be provided to patient from pharmacy. There are no recommendations for DME or Home Health.  Family to transport-   No additional TOC needs           Patient Goals and CMS Choice      Discharge Placement                         Discharge Plan and Services Additional resources added to the After Visit Summary for                                       Social Determinants of Health (SDOH) Interventions SDOH Screenings   Depression (PHQ2-9): Low Risk  (09/10/2018)  Tobacco Use: Low Risk  (03/04/2023)     Readmission Risk Interventions     No data to display

## 2023-03-08 NOTE — Discharge Summary (Signed)
PATIENT DETAILS Name: Amy Molina Age: 62 y.o. Sex: female Date of Birth: 1961-04-06 MRN: [id number]. Admitting Physician: Kendell Bane, MD WUJ:[id number], Cala Bradford, NP  Admit Date: 03/04/2023 Discharge date: 03/08/2023  Recommendations for Outpatient Follow-up:  Follow up with PCP in 1-2 weeks Please obtain CMP/CBC in one week Please ensure follow-up with hematology  Admitted From:  Home  Disposition: Home   Discharge Condition: good  CODE STATUS:   Code Status: Full Code   Diet recommendation:  Diet Order             Diet - low sodium heart healthy           Diet Carb Modified           Diet Carb Modified Fluid consistency: Thin; Room service appropriate? Yes  Diet effective now                    Brief Summary: Patient is a 62 y.o.  female with history of DM-2, HTN, HLD, asthma, chronic venous stasis bilateral lower extremities, morbid obesity who presented with LLE extremity redness/swelling-some confusion/dizziness.  Evaluated by [facility]-subsequently transferred to [facility] for further evaluation/treatment.   Significant events: 6/22>> admit to [facility]   Significant studies: 6/22>> x-ray left ankle: No fracture 6/22>> Doppler ultrasound LLE: Positive for DVT involving femoral/popliteal/posterior tibial vein 6/22>> MRI brain: No acute infarct 6/23>> CT head: No acute findings 623>> echo: EF 60-65%, grade 2 diastolic dysfunction.   Significant microbiology data: 6/22>> blood culture: No growth   Procedures: None   Consults: None  Assessment and Plan: SIRS secondary to superimposed cellulitis on LLE DVT Significantly better-significant decrease in swelling/erythema over the past several days-after being treated with IV cefazolin/anticoagulation  Leukocytosis has almost resolved Continue antibiotics on discharge-will be switched to Keflex-plan is to continue for 4 more days to complete a 7-day course Will be continued on anticoagulation   LLE  DVT Unprovoked Eliquis started 6/23 Outpatient hematology evaluation Briefly discussed with vascular surgery-Dr. Boris Sharper anticoagulation-no role for thrombectomy.     Acute metabolic encephalopathy Improved-completely awake/alert-back to baseline.  Suspect this was from soft tissue infection/cellulitis Neuroimaging negative.   Hypokalemia Repleted   DM-2 (A1c 5.3 on 6/22) CBG stable with SSI Resume usual outpatient regimen on discharge.  HLD Statin   HTN BP stable without the use of any antihypertensives-but creeping up-will resume losartan on discharge.   Bronchial asthma (on Fasenra) Not in exacerbation Bronchodilators   GERD PPI   Mood disorder Seems stable As needed Xanax Lexapro   Morbid Obesity: Estimated body mass index is 50.92 kg/m as calculated from the following:   Height as of 10/05/22: 5\' 5"  (1.651 m).   Weight as of this encounter: 138.8 kg.   Discharge Diagnoses:  Principal Problem:   Lower extremity cellulitis Active Problems:   Altered mental state   DVT (deep venous thrombosis) (HCC)   SIRS (systemic inflammatory response syndrome) (HCC)   Diabetes mellitus, type 2 (HCC)   Hypertension   Obesity, morbid (HCC)   Gastroesophageal reflux disease   Moderate persistent asthma, uncomplicated   Seasonal and perennial allergic rhinitis   Hyperlipidemia   Hypokalemia   Discharge Instructions:  Activity:  As tolerated with Full fall precautions use walker/cane & assistance as needed  Discharge Instructions     Ambulatory referral to Hematology / Oncology   Complete by: As directed    Call MD for:  difficulty breathing, headache or visual disturbances   Complete by: As directed  Call MD for:  redness, tenderness, or signs of infection (pain, swelling, redness, odor or green/yellow discharge around incision site)   Complete by: As directed    Call MD for:  severe uncontrolled pain   Complete by: As directed    Diet - low sodium  heart healthy   Complete by: As directed    Diet Carb Modified   Complete by: As directed    Discharge instructions   Complete by: As directed    Follow with Primary MD  [name], NP in 1-2 weeks  You will get a phone call from hematology/oncology office for a follow-up appointment-if you do not hear from them-please give them a call.  Please get a complete blood count and chemistry panel checked by your Primary MD at your next visit, and again as instructed by your Primary MD.  Get Medicines reviewed and adjusted: Please take all your medications with you for your next visit with your Primary MD  Laboratory/radiological data: Please request your Primary MD to go over all hospital tests and procedure/radiological results at the follow up, please ask your Primary MD to get all Hospital records sent to his/her office.  In some cases, they will be blood work, cultures and biopsy results pending at the time of your discharge. Please request that your primary care M.D. follows up on these results.  Also Note the following: If you experience worsening of your admission symptoms, develop shortness of breath, life threatening emergency, suicidal or homicidal thoughts you must seek medical attention immediately by calling 911 or calling your MD immediately  if symptoms less severe.  You must read complete instructions/literature along with all the possible adverse reactions/side effects for all the Medicines you take and that have been prescribed to you. Take any new Medicines after you have completely understood and accpet all the possible adverse reactions/side effects.   Do not drive when taking Pain medications or sleeping medications (Benzodaizepines)  Do not take more than prescribed Pain, Sleep and Anxiety Medications. It is not advisable to combine anxiety,sleep and pain medications without talking with your primary care practitioner  Special Instructions: If you have smoked or  chewed Tobacco  in the last 2 yrs please stop smoking, stop any regular Alcohol  and or any Recreational drug use.  Wear Seat belts while driving.  Please note: You were cared for by a hospitalist during your hospital stay. Once you are discharged, your primary care physician will handle any further medical issues. Please note that NO REFILLS for any discharge medications will be authorized once you are discharged, as it is imperative that you return to your primary care physician (or establish a relationship with a primary care physician if you do not have one) for your post hospital discharge needs so that they can reassess your need for medications and monitor your lab values.   Increase activity slowly   Complete by: As directed       Allergies as of 03/08/2023       Reactions   Penicillins Anaphylaxis, Shortness Of Breath, Swelling   DID THE REACTION INVOLVE: Swelling of the face/tongue/throat, SOB, or low BP? Yes Sudden or severe rash/hives, skin peeling, or the inside of the mouth or nose? No Did it require medical treatment? Yes When did it last happen?      15+ years If all above answers are "NO", may proceed with cephalosporin use. PATIENT TOLERATED ZOSYN AND MERREM ON 07/21/17 ADMISSION   Floxin [ofloxacin]  MIGRAINES, INSOMNIA, PARANOIND, RINGING IN EARS   Sulfa Antibiotics Hives   Lipitor [atorvastatin]    Severe myalgias   Penicillin G Benzathine Rash        Medication List     STOP taking these medications    doxycycline 100 MG tablet Commonly known as: VIBRA-TABS   oxyCODONE-acetaminophen 5-325 MG tablet Commonly known as: PERCOCET/ROXICET   predniSONE 50 MG tablet Commonly known as: DELTASONE   triamcinolone 55 MCG/ACT Aero nasal inhaler Commonly known as: NASACORT   triamcinolone ointment 0.5 % Commonly known as: KENALOG       TAKE these medications    acetaminophen 500 MG tablet Commonly known as: TYLENOL Take 1,000 mg by mouth 2 (two)  times daily as needed for moderate pain.   AeroChamber Plus Flo-Vu Large Misc Use as directed with metered dose inhaler.   albuterol (2.5 MG/3ML) 0.083% nebulizer solution Commonly known as: PROVENTIL Take 2.5 mg by nebulization every 6 (six) hours as needed for wheezing or shortness of breath. What changed: Another medication with the same name was changed. Make sure you understand how and when to take each.   albuterol 108 (90 Base) MCG/ACT inhaler Commonly known as: VENTOLIN HFA INHALE 2 PUFFS BY MOUTH EVERY 4 HOURS AS NEEDED FOR WHEEZING OR SHORTNESS OF BREATH What changed: See the new instructions.   ALPRAZolam 0.5 MG tablet Commonly known as: Xanax Take 1 tablet (0.5 mg total) by mouth 3 (three) times daily as needed for sleep or anxiety.   apixaban 5 MG Tabs tablet Commonly known as: ELIQUIS 10 mg (2 tabs) twice daily until 6/29-and on 6/30-switch to 5 mg (1 tab) twice daily Start taking on: March 12, 2023   Breztri Aerosphere 160-9-4.8 MCG/ACT Aero Generic drug: Budeson-Glycopyrrol-Formoterol Inhale 1 puff into the lungs daily.   cephALEXin 500 MG capsule Commonly known as: Keflex Take 2 capsules (1,000 mg total) by mouth every 8 (eight) hours for 4 days.   cetirizine 10 MG tablet Commonly known as: ZYRTEC Take 10 mg by mouth daily.   clobetasol ointment 0.05 % Commonly known as: TEMOVATE Apply 1 Application topically 2 (two) times daily.   Doxepin HCl 5 % Crea Apply 1 Application topically daily at 6 (six) AM.   escitalopram 20 MG tablet Commonly known as: LEXAPRO Take 1 tablet by mouth once daily   Fasenra Pen 30 MG/ML prefilled autoinjector Generic drug: benralizumab INJECT 30MG  SUBCUTANEOUSLY EVERY 8 WEEKS What changed: See the new instructions.   furosemide 20 MG tablet Commonly known as: LASIX Take 20 mg by mouth daily.   loperamide 2 MG tablet Commonly known as: IMODIUM A-D Take 2 mg by mouth daily as needed for diarrhea or loose stools.    loratadine 10 MG tablet Commonly known as: CLARITIN Take 10 mg by mouth daily.   losartan 50 MG tablet Commonly known as: COZAAR Take 1 tablet (50 mg total) by mouth daily. What changed: how much to take   metFORMIN 1000 MG tablet Commonly known as: GLUCOPHAGE Take 1 tablet (1,000 mg total) by mouth daily with breakfast.   Mounjaro 2.5 MG/0.5ML Pen Generic drug: tirzepatide Inject 2.5 mg into the skin once a week.   omeprazole 20 MG capsule Commonly known as: PRILOSEC Take 20 mg by mouth daily.   rosuvastatin 5 MG tablet Commonly known as: CRESTOR Take 5 mg by mouth daily.   Symbicort 160-4.5 MCG/ACT inhaler Generic drug: budesonide-formoterol INHALE 2 PUFFS BY MOUTH IN THE MORNING AND AT BEDTIME . MUST KEEP APPOINTMENT  WITH PRESCRIBER. What changed:  how much to take how to take this when to take this additional instructions   VISINE OP Place 1 drop into both eyes daily as needed (dry eyes).        Follow-up Information     [name], NP. Schedule an appointment as soon as possible for a visit in 1 week(s).   Contact information: [address] [place] [address] [phone]         [facility] Follow up.   Specialty: Oncology Why: Hospital follow up, Office will call with date/time, If you dont hear from them,please give them a call Contact information: [address] [id number] [address] [place] [address] [phone]               Allergies  Allergen Reactions   Penicillins Anaphylaxis, Shortness Of Breath and Swelling    DID THE REACTION INVOLVE: Swelling of the face/tongue/throat, SOB, or low BP? Yes Sudden or severe rash/hives, skin peeling, or the inside of the mouth or nose? No Did it require medical treatment? Yes When did it last happen?      15+ years If all above answers are "NO", may proceed with cephalosporin use.  PATIENT TOLERATED ZOSYN AND MERREM ON  07/21/17 ADMISSION    Floxin [Ofloxacin]     MIGRAINES, INSOMNIA, PARANOIND, RINGING IN EARS   Sulfa Antibiotics Hives   Lipitor [Atorvastatin]     Severe myalgias   Penicillin G Benzathine Rash     Other Procedures/Studies: ECHOCARDIOGRAM COMPLETE  Result Date: 03/05/2023    ECHOCARDIOGRAM REPORT   Patient Name:   Amy Molina Date of Exam: 03/05/2023 Medical Rec #:  [id number]      Height:       65.0 in Accession #:    [id number]     Weight:       317.0 lb Date of Birth:  13-Jun-1961      BSA:          2.406 m Patient Age:    61 years       BP:           106/47 mmHg Patient Gender: F              HR:           77 bpm. Exam Location:  Inpatient Procedure: 2D Echo, Cardiac Doppler and Color Doppler Indications:    Stroke  History:        Patient has no prior history of Echocardiogram examinations.                 Signs/Symptoms:Murmur; Risk Factors:Diabetes and Hypertension.  Sonographer:    Darlys Gales Referring Phys: [phone] SEYED A SHAHMEHDI IMPRESSIONS  1. Left ventricular ejection fraction, by estimation, is 60 to 65%. The left ventricle has normal function. The left ventricle has no regional wall motion abnormalities. There is mild concentric left ventricular hypertrophy. Left ventricular diastolic parameters are consistent with Grade II diastolic dysfunction (pseudonormalization). Elevated left atrial pressure.  2. Right ventricular systolic function is normal. The right ventricular size is normal. Tricuspid regurgitation signal is inadequate for assessing PA pressure.  3. The mitral valve is normal in structure. No evidence of mitral valve regurgitation. No evidence of mitral stenosis. Moderate mitral annular calcification.  4. The aortic valve is tricuspid. Aortic valve regurgitation is not visualized. No aortic stenosis is present.  5. The inferior vena cava is dilated in size  with <50% respiratory variability, suggesting right atrial pressure of 15 mmHg. FINDINGS  Left Ventricle: Left  ventricular ejection fraction, by estimation, is 60 to 65%. The left ventricle has normal function. The left ventricle has no regional wall motion abnormalities. The left ventricular internal cavity size was normal in size. There is  mild concentric left ventricular hypertrophy. Left ventricular diastolic parameters are consistent with Grade II diastolic dysfunction (pseudonormalization). Elevated left atrial pressure. Right Ventricle: The right ventricular size is normal. No increase in right ventricular wall thickness. Right ventricular systolic function is normal. Tricuspid regurgitation signal is inadequate for assessing PA pressure. Left Atrium: Left atrial size was normal in size. Right Atrium: Right atrial size was normal in size. Pericardium: There is no evidence of pericardial effusion. Mitral Valve: The mitral valve is normal in structure. Moderate mitral annular calcification. No evidence of mitral valve regurgitation. No evidence of mitral valve stenosis. Tricuspid Valve: The tricuspid valve is normal in structure. Tricuspid valve regurgitation is not demonstrated. Aortic Valve: The aortic valve is tricuspid. Aortic valve regurgitation is not visualized. No aortic stenosis is present. Aortic valve mean gradient measures 7.0 mmHg. Aortic valve peak gradient measures 9.5 mmHg. Aortic valve area, by VTI measures 2.58 cm. Pulmonic Valve: The pulmonic valve was not well visualized. Pulmonic valve regurgitation is not visualized. No evidence of pulmonic stenosis. Aorta: The aortic root and ascending aorta are structurally normal, with no evidence of dilitation. Venous: The inferior vena cava is dilated in size with less than 50% respiratory variability, suggesting right atrial pressure of 15 mmHg. IAS/Shunts: The interatrial septum was not well visualized.  LEFT VENTRICLE PLAX 2D LVIDd:         5.10 cm   Diastology LVIDs:         2.90 cm   LV e' medial:    7.51 cm/s LV PW:         1.20 cm   LV E/e' medial:   17.0 LV IVS:        1.10 cm   LV e' lateral:   6.31 cm/s LVOT diam:     1.90 cm   LV E/e' lateral: 20.3 LV SV:         88 LV SV Index:   36 LVOT Area:     2.84 cm  RIGHT VENTRICLE             IVC RV S prime:     15.20 cm/s  IVC diam: 2.70 cm TAPSE (M-mode): 3.4 cm LEFT ATRIUM             Index        RIGHT ATRIUM           Index LA Vol (A2C):   35.0 ml 14.55 ml/m  RA Area:     17.90 cm LA Vol (A4C):   60.3 ml 25.06 ml/m  RA Volume:   44.80 ml  18.62 ml/m LA Biplane Vol: 47.0 ml 19.54 ml/m  AORTIC VALVE AV Area (Vmax):    2.52 cm AV Area (Vmean):   2.32 cm AV Area (VTI):     2.58 cm AV Vmax:           154.00 cm/s AV Vmean:          126.000 cm/s AV VTI:            0.339 m AV Peak Grad:      9.5 mmHg AV Mean Grad:      7.0 mmHg LVOT Vmax:  137.00 cm/s LVOT Vmean:        103.000 cm/s LVOT VTI:          0.309 m LVOT/AV VTI ratio: 0.91  AORTA Ao Root diam: 3.10 cm Ao Asc diam:  3.50 cm MITRAL VALVE MV Area (PHT): 2.55 cm     SHUNTS MV Decel Time: 297 msec     Systemic VTI:  0.31 m MV E velocity: 128.00 cm/s  Systemic Diam: 1.90 cm MV A velocity: 117.00 cm/s MV E/A ratio:  1.09 Mihai Croitoru MD Electronically signed by Thurmon Fair MD Signature Date/Time: 03/05/2023/2:33:43 PM    Final    CT HEAD WO CONTRAST ( )  Result Date: 03/05/2023 CLINICAL DATA:  TIA. EXAM: CT HEAD WITHOUT CONTRAST TECHNIQUE: Contiguous axial images were obtained from the base of the skull through the vertex without intravenous contrast. RADIATION DOSE REDUCTION: This exam was performed according to the departmental dose-optimization program which includes automated exposure control, adjustment of the mA and/or kV according to patient size and/or use of iterative reconstruction technique. COMPARISON:  Brain MRI from yesterday FINDINGS: Brain: No evidence of acute infarction, hemorrhage, hydrocephalus, extra-axial collection or mass lesion/mass effect. Small chronic left cerebellar infarcts as seen on prior MRI. Cavum velum  interpositum. Vascular: No hyperdense vessel or unexpected calcification. Skull: Normal. Negative for fracture or focal lesion. Sinuses/Orbits: No acute finding. IMPRESSION: 1. No acute finding or change from MRI yesterday. 2. Remote left cerebellar infarcts. Electronically Signed   By: Tiburcio Pea M.D.   On: 03/05/2023 04:11   MR BRAIN WO CONTRAST  Result Date: 03/04/2023 CLINICAL DATA:  Neuro deficit, acute, stroke suspected word finding difficulty EXAM: MRI HEAD WITHOUT CONTRAST TECHNIQUE: Multiplanar, multiecho pulse sequences of the brain and surrounding structures were obtained without intravenous contrast. COMPARISON:  CT Head 04/19/14 FINDINGS: Limitations: Motion degraded exam. Brain: Negative for an acute infarct. No hemorrhage. No hydrocephalus. No extra-axial fluid collection. There is chronic infarct in the left cerebellum. Sequela of mild overall chronic microvascular ischemic change. Cavum velum interpositum. Vascular: Normal flow voids. Skull and upper cervical spine: Normal marrow signal. Sinuses/Orbits: No middle ear or mastoid effusion. Mild mucosal thickening in the bilateral ethmoid sinuses. Orbits are unremarkable Other: None. IMPRESSION: Motion degrade exam. 1.  Negative for an acute infarct. 2.  Chronic left cerebellar infarcts. Electronically Signed   By: Lorenza Cambridge M.D.   On: 03/04/2023 17:56   US Venous Img Lower Unilateral Left  Result Date: 03/04/2023 CLINICAL DATA:  Left leg redness EXAM: LEFT LOWER EXTREMITY VENOUS DOPPLER ULTRASOUND TECHNIQUE: Gray-scale sonography with graded compression, as well as color Doppler and duplex ultrasound were performed to evaluate the lower extremity deep venous systems from the level of the common femoral vein and including the common femoral, femoral, profunda femoral, popliteal and calf veins including the posterior tibial, peroneal and gastrocnemius veins when visible. The superficial great saphenous vein was also interrogated. Spectral  Doppler was utilized to evaluate flow at rest and with distal augmentation maneuvers in the common femoral, femoral and popliteal veins. COMPARISON:  02/24/2021 FINDINGS: Contralateral Common Femoral Vein: Respiratory phasicity is normal and symmetric with the symptomatic side. No evidence of thrombus. Normal compressibility. Common Femoral Vein: No evidence of thrombus. Normal compressibility, respiratory phasicity and response to augmentation. Saphenofemoral Junction: No evidence of thrombus. Normal compressibility and flow on color Doppler imaging. Profunda Femoral Vein: No evidence of thrombus. Normal compressibility and flow on color Doppler imaging. Femoral Vein: Partially occlusive thrombus within the mid left femoral vein. Occlusive  thrombus within the distal aspect of the left femoral vein. Popliteal Vein: Occlusive thrombus within the left popliteal vein. Calf Veins: Thrombus is seen extending into the posterior tibial vein. Peroneal veins were not well assessed. Superficial Great Saphenous Vein: No evidence of thrombus. Normal compressibility. Venous Reflux:  None. Other Findings:  None. IMPRESSION: Positive for deep venous thrombosis involving the left femoral, popliteal, and posterior tibial veins. Electronically Signed   By: Duanne Guess D.O.   On: 03/04/2023 11:17   DG Ankle Complete Left  Result Date: 03/04/2023 CLINICAL DATA:  Acute left ankle pain after fall yesterday. EXAM: LEFT ANKLE COMPLETE - 3+ VIEW COMPARISON:  None Available. FINDINGS: There is no evidence of fracture, dislocation, or joint effusion. There is no evidence of arthropathy or other focal bone abnormality. Soft tissues are unremarkable. IMPRESSION: Negative. Electronically Signed   By: Lupita Raider M.D.   On: 03/04/2023 09:12     TODAY-DAY OF DISCHARGE:  Subjective:   Amy Molina today has no headache,no chest abdominal pain,no new weakness tingling or numbness, feels much better wants to go home today.    Objective:   Blood pressure (!) 157/84, pulse 72, temperature 97.8 F (36.6 C), temperature source Oral, resp. rate 17, weight (!) 138.5 kg, last menstrual period 08/09/2016, SpO2 96 %.  Intake/Output Summary (Last 24 hours) at 03/08/2023 0835 Last data filed at 03/07/2023 2142 Gross per 24 hour  Intake 1187 ml  Output --  Net 1187 ml   Filed Weights   03/07/23 0500 03/08/23 0500  Weight: (!) 138.8 kg (!) 138.5 kg    Exam: Awake Alert, Oriented *3, No new F.N deficits, Normal affect [place].AT,PERRAL Supple Neck,No JVD, No cervical lymphadenopathy appriciated.  Symmetrical Chest wall movement, Good air movement bilaterally, CTAB RRR,No Gallops,Rubs or new Murmurs, No Parasternal Heave +ve B.Sounds, Abd Soft, Non tender, No organomegaly appriciated, No rebound -guarding or rigidity. No Cyanosis, Clubbing or edema, No new Rash or bruise   PERTINENT RADIOLOGIC STUDIES: No results found.   PERTINENT LAB RESULTS: CBC: Recent Labs    03/07/23 0302 03/08/23 0315  WBC 12.5* 13.3*  HGB 12.5 13.7  HCT 38.7 42.2  PLT 215 288   CMET CMP     Component Value Date/Time   NA 137 03/06/2023 0438   K 4.6 03/06/2023 0438   CL 105 03/06/2023 0438   CO2 25 03/06/2023 0438   GLUCOSE 93 03/06/2023 0438   BUN 9 03/06/2023 0438   CREATININE 0.82 03/06/2023 0438   CREATININE 0.57 10/24/2013 1149   CALCIUM 8.2 (L) 03/06/2023 0438   PROT 7.3 03/04/2023 1135   ALBUMIN 3.4 (L) 03/04/2023 1135   AST 30 03/04/2023 1135   ALT 17 03/04/2023 1135   ALKPHOS 65 03/04/2023 1135   BILITOT 1.8 (H) 03/04/2023 1135   GFR 126.20 07/27/2018 1502   GFRNONAA >60 03/06/2023 0438   GFRNONAA >89 10/24/2013 1149    GFR Estimated Creatinine Clearance: 104.5 mL/min (by C-G formula based on SCr of 0.82 mg/dL). No results for input(s): "LIPASE", "AMYLASE" in the last 72 hours. No results for input(s): "CKTOTAL", "CKMB", "CKMBINDEX", "TROPONINI" in the last 72 hours. Invalid input(s): "POCBNP" No  results for input(s): "DDIMER" in the last 72 hours. No results for input(s): "HGBA1C" in the last 72 hours. No results for input(s): "CHOL", "HDL", "LDLCALC", "TRIG", "CHOLHDL", "LDLDIRECT" in the last 72 hours. No results for input(s): "TSH", "T4TOTAL", "T3FREE", "THYROIDAB" in the last 72 hours.  Invalid input(s): "FREET3" No results for input(s): "VITAMINB12", "  FOLATE", "FERRITIN", "TIBC", "IRON", "RETICCTPCT" in the last 72 hours. Coags: No results for input(s): "INR" in the last 72 hours.  Invalid input(s): "PT" Microbiology: Recent Results (from the past 240 hour(s))  Culture, blood (Routine X 2) w Reflex to ID Panel     Status: None (Preliminary result)   Collection Time: 03/04/23 12:49 PM   Specimen: Right Antecubital; Blood  Result Value Ref Range Status   Specimen Description   Final    RIGHT ANTECUBITAL BOTTLES DRAWN AEROBIC AND ANAEROBIC   Special Requests Blood Culture adequate volume  Final   Culture   Final    NO GROWTH 3 DAYS Performed at [facility], [address] [place], [place] [address]    Report Status PENDING  Incomplete  Culture, blood (Routine X 2) w Reflex to ID Panel     Status: None (Preliminary result)   Collection Time: 03/04/23 12:49 PM   Specimen: Left Antecubital; Blood  Result Value Ref Range Status   Specimen Description   Final    LEFT ANTECUBITAL BOTTLES DRAWN AEROBIC AND ANAEROBIC   Special Requests Blood Culture adequate volume  Final   Culture   Final    NO GROWTH 3 DAYS Performed at [facility], [address] [place], [place] [address]    Report Status PENDING  Incomplete    FURTHER DISCHARGE INSTRUCTIONS:  Get Medicines reviewed and adjusted: Please take all your medications with you for your next visit with your Primary MD  Laboratory/radiological data: Please request your Primary MD to go over all hospital tests and procedure/radiological results at the follow up, please ask your Primary MD to get all Hospital records sent  to his/her office.  In some cases, they will be blood work, cultures and biopsy results pending at the time of your discharge. Please request that your primary care M.D. goes through all the records of your hospital data and follows up on these results.  Also Note the following: If you experience worsening of your admission symptoms, develop shortness of breath, life threatening emergency, suicidal or homicidal thoughts you must seek medical attention immediately by calling 911 or calling your MD immediately  if symptoms less severe.  You must read complete instructions/literature along with all the possible adverse reactions/side effects for all the Medicines you take and that have been prescribed to you. Take any new Medicines after you have completely understood and accpet all the possible adverse reactions/side effects.   Do not drive when taking Pain medications or sleeping medications (Benzodaizepines)  Do not take more than prescribed Pain, Sleep and Anxiety Medications. It is not advisable to combine anxiety,sleep and pain medications without talking with your primary care practitioner  Special Instructions: If you have smoked or chewed Tobacco  in the last 2 yrs please stop smoking, stop any regular Alcohol  and or any Recreational drug use.  Wear Seat belts while driving.  Please note: You were cared for by a hospitalist during your hospital stay. Once you are discharged, your primary care physician will handle any further medical issues. Please note that NO REFILLS for any discharge medications will be authorized once you are discharged, as it is imperative that you return to your primary care physician (or establish a relationship with a primary care physician if you do not have one) for your post hospital discharge needs so that they can reassess your need for medications and monitor your lab values.  Total Time spent coordinating discharge including counseling, education and face to  face time equals greater than 30 minutes.  SignedJeoffrey Massed 03/08/2023 8:35 AM

## 2023-03-08 NOTE — Patient Instructions (Addendum)
[facility]  Discharge Instructions  You were seen and examined today by Dr. Ellin Saba. Dr. Ellin Saba is a hematologist, meaning that he specializes in blood abnormalities. Dr. Ellin Saba discussed your past medical history, family history of cancers/blood conditions and the events that led to you being here today.  You were referred to Dr. Ellin Saba due to DVT (blood clot) in your left leg.  Dr. Ellin Saba has recommended being on Eliquis until otherwise instructed.  In 4 months, Dr. Ellin Saba will recheck with ultrasound of your leg as well as labs. The labs will indicate if you are predisposed to developing blood clots; therefore, deciding how long you should remain on Eliquis.  Follow-up as scheduled.  Thank you for choosing [facility] to provide your oncology and hematology care.   To afford each patient quality time with our provider, please arrive at least 15 minutes before your scheduled appointment time. You may need to reschedule your appointment if you arrive late (10 or more minutes). Arriving late affects you and other patients whose appointments are after yours.  Also, if you miss three or more appointments without notifying the office, you may be dismissed from the clinic at the provider's discretion.    Again, thank you for choosing [facility]  Our hope is that these requests will decrease the amount of time that you wait before being seen by our physicians.   If you have a lab appointment with the Cancer Center - please note that after April 8th, all labs will be drawn in the cancer center.  You do not have to check in or register with the main entrance as you have in the past but will complete your check-in at the cancer center.            _____________________________________________________________  Should you have questions after your visit to [facility], please contact our office at  [phone] and follow the prompts.  Our office hours are 8:00 a.m. to 4:30 p.m. Monday - Thursday and 8:00 a.m. to 2:30 p.m. Friday.  Please note that voicemails left after 4:00 p.m. may not be returned until the following business day.  We are closed weekends and all major holidays.  You do have access to a nurse 24-7, just call the main number to the clinic [phone] and do not press any options, hold on the line and a nurse will answer the phone.    For prescription refill requests, have your pharmacy contact our office and allow 72 hours.    Masks are no longer required in the cancer centers. If you would like for your care team to wear a mask while they are taking care of you, please let them know. You may have one support person who is at least 60 years old accompany you for your appointments.

## 2023-03-08 NOTE — Progress Notes (Unsigned)
CONSULT NOTE  Patient Care Team: [name], NP as PCP - General Doreatha Massed, MD as Medical Oncologist (Hematology)  CHIEF COMPLAINTS/PURPOSE OF CONSULTATION:  Unprovoked left leg DVT  HISTORY OF PRESENTING ILLNESS:  Amy Molina 62 y.o. female is seen in consultation today at the request of hospitalist service Dr. Jerral Ralph for unprovoked left leg DVT.  She presented with left leg redness and swelling to the ER on 03/04/2023.  Left leg Doppler showed positive DVT for left femoral, popliteal and posterior tibial veins.  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  No prior history of thrombosis.  She lost 36 pounds in the last 1 year while on Mounjaro.  She had 1 miscarriage 40 years ago, in the first trimester.  Denies any fevers or night sweats.  She was also discharged on Keflex for cellulitis of the left leg.  MEDICAL HISTORY:  Past Medical History:  Diagnosis Date   Anxiety    Arthritis    general stiffness of joints   Asthma    Complication of anesthesia    per pt,"hard to wake up past some sedation!   Diabetes mellitus (HCC)    Diarrhea    Heart murmur    childhood years- no mention in adult yrs   History of indigestion    Hypertension    Morbid obesity (HCC)    Recurrent upper respiratory infection (URI)    Seasonal allergies    Spinal stenosis     SURGICAL HISTORY: Past Surgical History:  Procedure Laterality Date   COLONOSCOPY N/A 08/04/2015   Procedure: COLONOSCOPY;  Surgeon: Beverley Fiedler, MD;  Location: [facility];  Service: Gastroenterology;  Laterality: N/A;   COLONOSCOPY     COLONOSCOPY WITH PROPOFOL N/A 10/22/2018   Procedure: COLONOSCOPY WITH PROPOFOL;  Surgeon: Beverley Fiedler, MD;  Location: [facility];  Service: Gastroenterology;  Laterality: N/A;   OOPHORECTOMY  05-2000   REMOVED LEFT TUBE AND OVARY   POLYPECTOMY  10/22/2018   Procedure: POLYPECTOMY;   Surgeon: Beverley Fiedler, MD;  Location: [facility];  Service: Gastroenterology;;   WISDOM TOOTH EXTRACTION      SOCIAL HISTORY: Social History   Socioeconomic History   Marital status: Single    Spouse name: Not on file   Number of children: Not on file   Years of education: Not on file   Highest education level: Not on file  Occupational History   Not on file  Tobacco Use   Smoking status: Never   Smokeless tobacco: Never  Vaping Use   Vaping Use: Never used  Substance and Sexual Activity   Alcohol use: Yes    Alcohol/week: 0.0 standard drinks of alcohol    Comment: RARELY   Drug use: No   Sexual activity: Not Currently    Birth control/protection: None  Other Topics Concern   Not on file  Social History Narrative   Not on file   Social Determinants of Health   Financial Resource Strain: Not on file  Food Insecurity: Not on file  Transportation Needs: Not on file  Physical Activity: Not on file  Stress: Not on file  Social Connections: Not on file  Intimate Partner Violence: Not on file    FAMILY HISTORY: Family History  Problem Relation Age of Onset   Eczema Mother    Heart disease Mother    Heart attack Mother    Breast cancer Mother  Hypertension Father    Heart disease Father    Colon polyps Father    COPD Father    Kidney cancer Father    Asthma Sister    Allergic rhinitis Sister    Diabetes Sister    Diabetes Maternal Aunt        7 mat aunts diabetes   Hypertension Maternal Aunt    Lung disease Maternal Aunt    Hypertension Paternal Aunt    Breast cancer Paternal Aunt    Colon cancer Neg Hx     ALLERGIES:  is allergic to penicillins, floxin [ofloxacin], sulfa antibiotics, lipitor [atorvastatin], and penicillin g benzathine.  MEDICATIONS:  Current Outpatient Medications  Medication Sig Dispense Refill   acetaminophen (TYLENOL) 500 MG tablet Take 1,000 mg by mouth 2 (two) times daily as needed for moderate pain.     albuterol  (PROVENTIL) (2.5 MG/3ML) 0.083% nebulizer solution Take 2.5 mg by nebulization every 6 (six) hours as needed for wheezing or shortness of breath.     albuterol (VENTOLIN HFA) 108 (90 Base) MCG/ACT inhaler INHALE 2 PUFFS BY MOUTH EVERY 4 HOURS AS NEEDED FOR WHEEZING OR SHORTNESS OF BREATH (Patient taking differently: Inhale 2 puffs into the lungs every 4 (four) hours as needed for shortness of breath.) 18 g 1   ALPRAZolam (XANAX) 0.5 MG tablet Take 1 tablet (0.5 mg total) by mouth 3 (three) times daily as needed for sleep or anxiety. 90 tablet 1   [START ON 03/12/2023] apixaban (ELIQUIS) 5 MG TABS tablet Take 2 tablets (10 mg total) by mouth 2 (two) times daily until 6/29, THEN on 6/30 take 1 tablet (5 mg total) by mouth 2 (two) times daily thereafter 90 tablet 3   BREZTRI AEROSPHERE 160-9-4.8 MCG/ACT AERO Inhale 1 puff into the lungs daily.     cephALEXin (KEFLEX) 500 MG capsule Take 2 capsules (1,000 mg total) by mouth every 8 (eight) hours for 4 days. 24 capsule 0   cetirizine (ZYRTEC) 10 MG tablet Take 10 mg by mouth daily.     clobetasol ointment (TEMOVATE) 0.05 % Apply 1 Application topically 2 (two) times daily.     Doxepin HCl 5 % CREA Apply 1 Application topically daily at 6 (six) AM.     escitalopram (LEXAPRO) 20 MG tablet Take 1 tablet by mouth once daily 30 tablet 5   FASENRA PEN 30 MG/ML SOAJ INJECT 30MG  SUBCUTANEOUSLY EVERY 8 WEEKS (Patient taking differently: Inject 30 mg into the skin every 8 (eight) weeks.) 1 mL 9   furosemide (LASIX) 20 MG tablet Take 20 mg by mouth daily.     loperamide (IMODIUM A-D) 2 MG tablet Take 2 mg by mouth daily as needed for diarrhea or loose stools.     loratadine (CLARITIN) 10 MG tablet Take 10 mg by mouth daily.     losartan (COZAAR) 50 MG tablet Take 1 tablet (50 mg total) by mouth daily. (Patient taking differently: Take 25 mg by mouth daily.) 90 tablet 3   metFORMIN (GLUCOPHAGE) 1000 MG tablet Take 1 tablet (1,000 mg total) by mouth daily with  breakfast. 90 tablet 3   omeprazole (PRILOSEC) 20 MG capsule Take 20 mg by mouth daily.     rosuvastatin (CRESTOR) 5 MG tablet Take 5 mg by mouth daily.     Spacer/Aero-Holding Chambers (AEROCHAMBER PLUS FLO-VU LARGE) MISC Use as directed with metered dose inhaler. 1 each 1   SYMBICORT 160-4.5 MCG/ACT inhaler INHALE 2 PUFFS BY MOUTH IN THE MORNING AND AT BEDTIME .  MUST KEEP APPOINTMENT WITH PRESCRIBER. (Patient taking differently: Inhale 2 puffs into the lungs 2 (two) times daily.) 11 g 4   Tetrahydrozoline HCl (VISINE OP) Place 1 drop into both eyes daily as needed (dry eyes).     MOUNJARO 5 MG/0.5ML Pen SMARTSIG:5 Milligram(s) SUB-Q Once a Week     Current Facility-Administered Medications  Medication Dose Route Frequency Provider Last Rate Last Admin   Benralizumab SOSY 30 mg  30 mg Subcutaneous Q28 days Alfonse Spruce, MD   30 mg at 01/09/23 1610    REVIEW OF SYSTEMS:   Constitutional: Denies fevers, chills or abnormal night sweats Eyes: Denies blurriness of vision, double vision or watery eyes Ears, nose, mouth, throat, and face: Denies mucositis or sore throat Respiratory: Positive for dyspnea on exertion.  No cough. Cardiovascular: Denies palpitation, chest discomfort.  Positive for leg swelling, left more than right. Gastrointestinal:  Denies nausea, heartburn or change in bowel habits Skin: Denies abnormal skin rashes Lymphatics: Denies new lymphadenopathy or easy bruising Neurological:Denies numbness, tingling or new weaknesses Behavioral/Psych: Mood is stable, no new changes  All other systems were reviewed with the patient and are negative.  PHYSICAL EXAMINATION: ECOG PERFORMANCE STATUS: 0 - Asymptomatic  Vitals:   03/08/23 1328  BP: 138/72  Pulse: 76  Resp: 18  Temp: 97.9 F (36.6 C)  SpO2: 97%   Filed Weights   03/08/23 1328  Weight: 299 lb 4.8 oz (135.8 kg)    GENERAL:alert, no distress and comfortable SKIN: skin color, texture, turgor are normal, no  rashes or significant lesions EYES: normal, conjunctiva are pink and non-injected, sclera clear OROPHARYNX:no exudate, no erythema and lips, buccal mucosa, and tongue normal  NECK: supple, thyroid normal size, non-tender, without nodularity LYMPH:  no palpable lymphadenopathy in the cervical, axillary or inguinal LUNGS: clear to auscultation and percussion with normal breathing effort HEART: regular rate & rhythm and no murmurs.  Left leg chronic venous stasis changes with mild erythema.  Left leg swollen more than the right leg. ABDOMEN:abdomen soft, non-tender and normal bowel sounds Musculoskeletal:no cyanosis of digits and no clubbing  PSYCH: alert & oriented x 3 with fluent speech NEURO: no focal motor/sensory deficits  LABORATORY DATA:  I have reviewed the data as listed Recent Results (from the past 2160 hour(s))  POCT CBG (manual entry)     Status: Abnormal   Collection Time: 03/04/23  8:28 AM  Result Value Ref Range   POCT Glucose (KUC) 140 (A) 70 - 99 mg/dL  Ethanol     Status: None   Collection Time: 03/04/23 11:35 AM  Result Value Ref Range   Alcohol, Ethyl (B) <10 <10 mg/dL    Comment: (NOTE) Lowest detectable limit for serum alcohol is 10 mg/dL.  For medical purposes only. Performed at [facility], [address] [place], [place] [address]   Protime-INR     Status: Abnormal   Collection Time: 03/04/23 11:35 AM  Result Value Ref Range   Prothrombin Time 16.3 (H) 11.4 - 15.2 seconds   INR 1.3 (H) 0.8 - 1.2    Comment: (NOTE) INR goal varies based on device and disease states. Performed at [facility], [address] [place], [place] [address]   APTT     Status: None   Collection Time: 03/04/23 11:35 AM  Result Value Ref Range   aPTT 33 24 - 36 seconds    Comment: Performed at [facility], [address], [place], [place] [address]  CBC  Status: Abnormal   Collection Time: 03/04/23 11:35 AM  Result Value Ref Range   WBC 32.2 (H) 4.0 - 10.5 K/uL   RBC  4.32 3.87 - 5.11 MIL/uL   Hemoglobin 13.5 12.0 - 15.0 g/dL   HCT 78.2 95.6 - 21.3 %   MCV 94.0 80.0 - 100.0 fL   MCH 31.3 26.0 - 34.0 pg   MCHC 33.3 30.0 - 36.0 g/dL   RDW 08.6 57.8 - 46.9 %   Platelets 201 150 - 400 K/uL   nRBC 0.0 0.0 - 0.2 %    Comment: Performed at [facility], [address] [place], [place] [address]  Differential     Status: Abnormal   Collection Time: 03/04/23 11:35 AM  Result Value Ref Range   Neutrophils Relative % 86 %   Neutro Abs 27.4 (H) 1.7 - 7.7 K/uL   Lymphocytes Relative 8 %   Lymphs Abs 2.7 0.7 - 4.0 K/uL   Monocytes Relative 5 %   Monocytes Absolute 1.7 (H) 0.1 - 1.0 K/uL   Eosinophils Relative 0 %   Eosinophils Absolute 0.0 0.0 - 0.5 K/uL   Basophils Relative 0 %   Basophils Absolute 0.1 0.0 - 0.1 K/uL   WBC Morphology VACUOLATED NEUTROPHILS    RBC Morphology MORPHOLOGY UNREMARKABLE    Smear Review MORPHOLOGY UNREMARKABLE    Immature Granulocytes 1 %   Abs Immature Granulocytes 0.38 (H) 0.00 - 0.07 K/uL    Comment: Performed at [facility], [address], [place], [place] [address]  Comprehensive metabolic panel     Status: Abnormal   Collection Time: 03/04/23 11:35 AM  Result Value Ref Range   Sodium 134 (L) 135 - 145 mmol/L   Potassium 3.1 (L) 3.5 - 5.1 mmol/L   Chloride 99 98 - 111 mmol/L   CO2 23 22 - 32 mmol/L   Glucose, Bld 147 (H) 70 - 99 mg/dL    Comment: Glucose reference range applies only to samples taken after fasting for at least 8 hours.   BUN 13 6 - 20 mg/dL   Creatinine, Ser 4.40 0.44 - 1.00 mg/dL   Calcium 8.0 (L) 8.9 - 10.3 mg/dL   Total Protein 7.3 6.5 - 8.1 g/dL   Albumin 3.4 (L) 3.5 - 5.0 g/dL   AST 30 15 - 41 U/L   ALT 17 0 - 44 U/L   Alkaline Phosphatase 65 38 - 126 U/L   Total Bilirubin 1.8 (H) 0.3 - 1.2 mg/dL   GFR, Estimated >10 >27 mL/min    Comment: (NOTE) Calculated using the CKD-EPI Creatinine Equation (2021)    Anion gap 12 5 - 15    Comment: Performed at [facility], [address],  [place], [place] [address]  Hemoglobin A1c     Status: None   Collection Time: 03/04/23 11:35 AM  Result Value Ref Range   Hgb A1c MFr Bld 5.3 4.8 - 5.6 %    Comment: (NOTE) Pre diabetes:          5.7%-6.4%  Diabetes:              >6.4%  Glycemic control for   <7.0% adults with diabetes    Mean Plasma Glucose 105.41 mg/dL    Comment: Performed at [facility], [address], [place], [place] [address]  TSH     Status: None   Collection Time: 03/04/23 11:35 AM  Result Value Ref Range   TSH 1.587 0.350 - 4.500 uIU/mL  Comment: Performed by a 3rd Generation assay with a functional sensitivity of <=0.01 uIU/mL. Performed at [facility], [address], [place], [place] [address]   Procalcitonin     Status: None   Collection Time: 03/04/23 11:35 AM  Result Value Ref Range   Procalcitonin 15.48 ng/mL    Comment:        Interpretation: PCT >= 10 ng/mL: Important systemic inflammatory response, almost exclusively due to severe bacterial sepsis or septic shock. (NOTE)       Sepsis PCT Algorithm           Lower Respiratory Tract                                      Infection PCT Algorithm    ----------------------------     ----------------------------         PCT < 0.25 ng/mL                PCT < 0.10 ng/mL          Strongly encourage             Strongly discourage   discontinuation of antibiotics    initiation of antibiotics    ----------------------------     -----------------------------       PCT 0.25 - 0.50 ng/mL            PCT 0.10 - 0.25 ng/mL               OR       >80% decrease in PCT            Discourage initiation of                                            antibiotics      Encourage discontinuation           of antibiotics    ----------------------------     -----------------------------         PCT >= 0.50 ng/mL              PCT 0.26 - 0.50 ng/mL                AND       <80% decrease in PCT             Encourage initiation of                                              antibiotics       Encourage continuation           of antibiotics    ----------------------------     -----------------------------        PCT >= 0.50 ng/mL                  PCT > 0.50 ng/mL               AND         increase in PCT                  Strongly encourage  initiation of antibiotics    Strongly encourage escalation           of antibiotics                                     -----------------------------                                           PCT <= 0.25 ng/mL                                                 OR                                        > 80% decrease in PCT                                      Discontinue / Do not initiate                                             antibiotics  Performed at [facility], [address] [place], [place] [address]   Urine rapid drug screen (hosp performed)     Status: None   Collection Time: 03/04/23 12:10 PM  Result Value Ref Range   Opiates NONE DETECTED NONE DETECTED   Cocaine NONE DETECTED NONE DETECTED   Benzodiazepines NONE DETECTED NONE DETECTED   Amphetamines NONE DETECTED NONE DETECTED   Tetrahydrocannabinol NONE DETECTED NONE DETECTED   Barbiturates NONE DETECTED NONE DETECTED    Comment: (NOTE) DRUG SCREEN FOR MEDICAL PURPOSES ONLY.  IF CONFIRMATION IS NEEDED FOR ANY PURPOSE, NOTIFY LAB WITHIN 5 DAYS.  LOWEST DETECTABLE LIMITS FOR URINE DRUG SCREEN Drug Class                     Cutoff (ng/mL) Amphetamine and metabolites    1000 Barbiturate and metabolites    200 Benzodiazepine                 200 Opiates and metabolites        300 Cocaine and metabolites        300 THC                            50 Performed at [facility], [address] [place], [place] [address]   Urinalysis, Routine w reflex microscopic -Urine, Clean Catch     Status: Abnormal   Collection Time: 03/04/23 12:10 PM  Result Value Ref Range   Color, Urine AMBER (A) YELLOW     Comment: BIOCHEMICALS MAY BE AFFECTED BY COLOR   APPearance HAZY (A) CLEAR   Specific Gravity, Urine 1.017 1.005 - 1.030   pH 5.0 5.0 - 8.0   Glucose, UA NEGATIVE NEGATIVE mg/dL   Hgb urine dipstick NEGATIVE NEGATIVE   Bilirubin Urine  NEGATIVE NEGATIVE   Ketones, ur NEGATIVE NEGATIVE mg/dL   Protein, ur 30 (A) NEGATIVE mg/dL   Nitrite NEGATIVE NEGATIVE   Leukocytes,Ua NEGATIVE NEGATIVE   RBC / HPF 0-5 0 - 5 RBC/hpf   WBC, UA 0-5 0 - 5 WBC/hpf   Bacteria, UA NONE SEEN NONE SEEN   Squamous Epithelial / HPF 0-5 0 - 5 /HPF   Mucus PRESENT    Hyaline Casts, UA PRESENT     Comment: Performed at [facility], [address] [place], [place] [address]  CBG monitoring, ED     Status: Abnormal   Collection Time: 03/04/23 12:29 PM  Result Value Ref Range   Glucose-Capillary 133 (H) 70 - 99 mg/dL    Comment: Glucose reference range applies only to samples taken after fasting for at least 8 hours.  Culture, blood (Routine X 2) w Reflex to ID Panel     Status: None   Collection Time: 03/04/23 12:49 PM   Specimen: Right Antecubital; Blood  Result Value Ref Range   Specimen Description      RIGHT ANTECUBITAL BOTTLES DRAWN AEROBIC AND ANAEROBIC   Special Requests Blood Culture adequate volume    Culture      NO GROWTH 5 DAYS Performed at [facility], [address] [place], [place] [address]    Report Status 03/09/2023 FINAL   Culture, blood (Routine X 2) w Reflex to ID Panel     Status: None   Collection Time: 03/04/23 12:49 PM   Specimen: Left Antecubital; Blood  Result Value Ref Range   Specimen Description      LEFT ANTECUBITAL BOTTLES DRAWN AEROBIC AND ANAEROBIC   Special Requests Blood Culture adequate volume    Culture      NO GROWTH 5 DAYS Performed at [facility], [address] [place], [place] [address]    Report Status 03/09/2023 FINAL   HIV Antibody (routine testing w rflx)     Status: None   Collection Time: 03/04/23 12:49 PM  Result Value Ref Range   HIV Screen 4th  Generation wRfx Non Reactive Non Reactive    Comment: Performed at [facility], [address], [place], [place] [address]  Magnesium     Status: Abnormal   Collection Time: 03/04/23 12:49 PM  Result Value Ref Range   Magnesium 1.2 (L) 1.7 - 2.4 mg/dL    Comment: Performed at [facility], [address] [place], [place] [address]  Phosphorus     Status: None   Collection Time: 03/04/23 12:49 PM  Result Value Ref Range   Phosphorus 2.5 2.5 - 4.6 mg/dL    Comment: Performed at [facility], [address] [place], [place] [address]  Lactic acid, plasma     Status: Abnormal   Collection Time: 03/04/23  1:14 PM  Result Value Ref Range   Lactic Acid, Venous 2.9 (HH) 0.5 - 1.9 mmol/L    Comment: CRITICAL RESULT CALLED TO, READ BACK BY AND VERIFIED WITH BASSINGER, L AT 1344 ON 03/04/23 BY SMN. Performed at [facility], [address], [place], [place] [address]   Vitamin B12     Status: None   Collection Time: 03/04/23  2:07 PM  Result Value Ref Range   Vitamin B-12 262 180 - 914 pg/mL    Comment: (NOTE) This assay is not validated for testing neonatal or myeloproliferative syndrome specimens for Vitamin B12 levels. Performed at [facility], [address] [place], [place] [address]   Sedimentation rate  Status: Abnormal   Collection Time: 03/04/23  2:07 PM  Result Value Ref Range   Sed Rate 42 (H) 0 - 22 mm/hr    Comment: Performed at [facility], [address], [place], [place] [address]  C-reactive protein     Status: Abnormal   Collection Time: 03/04/23  2:07 PM  Result Value Ref Range   CRP 25.0 (H) <1.0 mg/dL    Comment: Performed at [facility], [address], [place], [place] [address]  Glucose, capillary     Status: Abnormal   Collection Time: 03/04/23  3:43 PM  Result Value Ref Range   Glucose-Capillary 159 (H) 70 - 99 mg/dL    Comment: Glucose reference range applies only to samples taken after fasting for at least 8 hours.  Glucose, capillary      Status: Abnormal   Collection Time: 03/04/23  9:38 PM  Result Value Ref Range   Glucose-Capillary 158 (H) 70 - 99 mg/dL    Comment: Glucose reference range applies only to samples taken after fasting for at least 8 hours.  CBC     Status: Abnormal   Collection Time: 03/05/23  4:56 AM  Result Value Ref Range   WBC 22.2 (H) 4.0 - 10.5 K/uL   RBC 3.74 (L) 3.87 - 5.11 MIL/uL   Hemoglobin 11.9 (L) 12.0 - 15.0 g/dL   HCT 09.8 (L) 11.9 - 14.7 %   MCV 95.7 80.0 - 100.0 fL   MCH 31.8 26.0 - 34.0 pg   MCHC 33.2 30.0 - 36.0 g/dL   RDW 82.9 56.2 - 13.0 %   Platelets 182 150 - 400 K/uL   nRBC 0.0 0.0 - 0.2 %    Comment: Performed at [facility], [address] [place], [place] [address]  APTT     Status: Abnormal   Collection Time: 03/05/23  4:56 AM  Result Value Ref Range   aPTT 37 (H) 24 - 36 seconds    Comment:        IF BASELINE aPTT IS ELEVATED, SUGGEST PATIENT RISK ASSESSMENT BE USED TO DETERMINE APPROPRIATE ANTICOAGULANT THERAPY. Performed at [facility], [address] [place], [place] [address]   Basic metabolic panel     Status: Abnormal   Collection Time: 03/05/23  4:56 AM  Result Value Ref Range   Sodium 135 135 - 145 mmol/L   Potassium 2.9 (L) 3.5 - 5.1 mmol/L   Chloride 103 98 - 111 mmol/L   CO2 24 22 - 32 mmol/L   Glucose, Bld 116 (H) 70 - 99 mg/dL    Comment: Glucose reference range applies only to samples taken after fasting for at least 8 hours.   BUN 11 6 - 20 mg/dL   Creatinine, Ser 9.52 0.44 - 1.00 mg/dL   Calcium 7.7 (L) 8.9 - 10.3 mg/dL   GFR, Estimated >84 >13 mL/min    Comment: (NOTE) Calculated using the CKD-EPI Creatinine Equation (2021)    Anion gap 8 5 - 15    Comment: Performed at [facility], [address], [place], [place] [address]  Glucose, capillary     Status: Abnormal   Collection Time: 03/05/23  8:16 AM  Result Value Ref Range   Glucose-Capillary 114 (H) 70 - 99 mg/dL    Comment: Glucose reference range applies only  to samples taken after fasting for at least 8 hours.  Lactic acid, plasma     Status: None   Collection  Time: 03/05/23  9:08 AM  Result Value Ref Range   Lactic Acid, Venous 1.7 0.5 - 1.9 mmol/L    Comment: Performed at [facility], [address] [place], [place] [address]  Glucose, capillary     Status: Abnormal   Collection Time: 03/05/23 12:05 PM  Result Value Ref Range   Glucose-Capillary 144 (H) 70 - 99 mg/dL    Comment: Glucose reference range applies only to samples taken after fasting for at least 8 hours.  ECHOCARDIOGRAM COMPLETE     Status: None   Collection Time: 03/05/23  1:51 PM  Result Value Ref Range   BP 106/47 mmHg   S' Lateral 2.90 cm   AR max vel 2.52 cm2   AV Area VTI 2.58 cm2   AV Mean grad 7.0 mmHg   AV Peak grad 9.5 mmHg   Ao pk vel 1.54 m/s   Area-P 1/2 2.55 cm2   AV Area mean vel 2.32 cm2   Est EF 60 - 65%   Glucose, capillary     Status: Abnormal   Collection Time: 03/05/23  4:50 PM  Result Value Ref Range   Glucose-Capillary 148 (H) 70 - 99 mg/dL    Comment: Glucose reference range applies only to samples taken after fasting for at least 8 hours.  Glucose, capillary     Status: Abnormal   Collection Time: 03/05/23  9:29 PM  Result Value Ref Range   Glucose-Capillary 128 (H) 70 - 99 mg/dL    Comment: Glucose reference range applies only to samples taken after fasting for at least 8 hours.  CBC     Status: Abnormal   Collection Time: 03/06/23  4:38 AM  Result Value Ref Range   WBC 15.1 (H) 4.0 - 10.5 K/uL   RBC 3.82 (L) 3.87 - 5.11 MIL/uL   Hemoglobin 12.0 12.0 - 15.0 g/dL   HCT 13.0 86.5 - 78.4 %   MCV 98.2 80.0 - 100.0 fL   MCH 31.4 26.0 - 34.0 pg   MCHC 32.0 30.0 - 36.0 g/dL   RDW 69.6 29.5 - 28.4 %   Platelets 195 150 - 400 K/uL   nRBC 0.0 0.0 - 0.2 %    Comment: Performed at [facility], [address], [place], [place] [address]  Basic metabolic panel     Status: Abnormal   Collection Time: 03/06/23  4:38 AM  Result  Value Ref Range   Sodium 137 135 - 145 mmol/L   Potassium 4.6 3.5 - 5.1 mmol/L   Chloride 105 98 - 111 mmol/L   CO2 25 22 - 32 mmol/L   Glucose, Bld 93 70 - 99 mg/dL    Comment: Glucose reference range applies only to samples taken after fasting for at least 8 hours.   BUN 9 6 - 20 mg/dL   Creatinine, Ser 0.10 0.44 - 1.00 mg/dL   Calcium 8.2 (L) 8.9 - 10.3 mg/dL   GFR, Estimated >27 >25 mL/min    Comment: (NOTE) Calculated using the CKD-EPI Creatinine Equation (2021)    Anion gap 7 5 - 15    Comment: Performed at [facility], [address], [place], [place] [id number]  Magnesium     Status: None   Collection Time: 03/06/23  4:38 AM  Result Value Ref Range   Magnesium 1.7 1.7 - 2.4 mg/dL    Comment: Performed at [facility], [address], [place], [place] [address]  Glucose, capillary  Status: Abnormal   Collection Time: 03/06/23  8:13 AM  Result Value Ref Range   Glucose-Capillary 135 (H) 70 - 99 mg/dL    Comment: Glucose reference range applies only to samples taken after fasting for at least 8 hours.   Comment 1 Notify RN    Comment 2 Document in Chart   Glucose, capillary     Status: Abnormal   Collection Time: 03/06/23 12:38 PM  Result Value Ref Range   Glucose-Capillary 130 (H) 70 - 99 mg/dL    Comment: Glucose reference range applies only to samples taken after fasting for at least 8 hours.   Comment 1 Notify RN    Comment 2 Document in Chart   Glucose, capillary     Status: Abnormal   Collection Time: 03/06/23  4:44 PM  Result Value Ref Range   Glucose-Capillary 138 (H) 70 - 99 mg/dL    Comment: Glucose reference range applies only to samples taken after fasting for at least 8 hours.   Comment 1 Notify RN    Comment 2 Document in Chart   Glucose, capillary     Status: Abnormal   Collection Time: 03/06/23  9:31 PM  Result Value Ref Range   Glucose-Capillary 133 (H) 70 - 99 mg/dL    Comment: Glucose reference range applies only to samples taken  after fasting for at least 8 hours.  CBC     Status: Abnormal   Collection Time: 03/07/23  3:02 AM  Result Value Ref Range   WBC 12.5 (H) 4.0 - 10.5 K/uL   RBC 4.04 3.87 - 5.11 MIL/uL   Hemoglobin 12.5 12.0 - 15.0 g/dL   HCT 16.1 09.6 - 04.5 %   MCV 95.8 80.0 - 100.0 fL   MCH 30.9 26.0 - 34.0 pg   MCHC 32.3 30.0 - 36.0 g/dL   RDW 40.9 81.1 - 91.4 %   Platelets 215 150 - 400 K/uL   nRBC 0.0 0.0 - 0.2 %    Comment: Performed at [facility], [address], [place], [place] [address]  Glucose, capillary     Status: Abnormal   Collection Time: 03/07/23  7:57 AM  Result Value Ref Range   Glucose-Capillary 117 (H) 70 - 99 mg/dL    Comment: Glucose reference range applies only to samples taken after fasting for at least 8 hours.  Glucose, capillary     Status: Abnormal   Collection Time: 03/07/23 11:43 AM  Result Value Ref Range   Glucose-Capillary 196 (H) 70 - 99 mg/dL    Comment: Glucose reference range applies only to samples taken after fasting for at least 8 hours.  Glucose, capillary     Status: Abnormal   Collection Time: 03/07/23  4:12 PM  Result Value Ref Range   Glucose-Capillary 162 (H) 70 - 99 mg/dL    Comment: Glucose reference range applies only to samples taken after fasting for at least 8 hours.  Glucose, capillary     Status: Abnormal   Collection Time: 03/07/23  9:34 PM  Result Value Ref Range   Glucose-Capillary 177 (H) 70 - 99 mg/dL    Comment: Glucose reference range applies only to samples taken after fasting for at least 8 hours.  CBC     Status: Abnormal   Collection Time: 03/08/23  3:15 AM  Result Value Ref Range   WBC 13.3 (H) 4.0 - 10.5 K/uL   RBC 4.47 3.87 - 5.11 MIL/uL   Hemoglobin 13.7 12.0 -  15.0 g/dL   HCT 16.1 09.6 - 04.5 %   MCV 94.4 80.0 - 100.0 fL   MCH 30.6 26.0 - 34.0 pg   MCHC 32.5 30.0 - 36.0 g/dL   RDW 40.9 81.1 - 91.4 %   Platelets 288 150 - 400 K/uL   nRBC 0.0 0.0 - 0.2 %    Comment: Performed at [facility], [address], [place], [place] [address]  Glucose, capillary     Status: Abnormal   Collection Time: 03/08/23  7:45 AM  Result Value Ref Range   Glucose-Capillary 105 (H) 70 - 99 mg/dL    Comment: Glucose reference range applies only to samples taken after fasting for at least 8 hours.    RADIOGRAPHIC STUDIES: I have personally reviewed the radiological images as listed and agreed with the findings in the report. ECHOCARDIOGRAM COMPLETE  Result Date: 03/05/2023    ECHOCARDIOGRAM REPORT   Patient Name:   LAKEITA PANTHER Date of Exam: 03/05/2023 Medical Rec #:  [id number]      Height:       65.0 in Accession #:    [id number]     Weight:       317.0 lb Date of Birth:  1961/03/02      BSA:          2.406 m Patient Age:    61 years       BP:           106/47 mmHg Patient Gender: F              HR:           77 bpm. Exam Location:  Inpatient Procedure: 2D Echo, Cardiac Doppler and Color Doppler Indications:    Stroke  History:        Patient has no prior history of Echocardiogram examinations.                 Signs/Symptoms:Murmur; Risk Factors:Diabetes and Hypertension.  Sonographer:    Darlys Gales Referring Phys: [phone] SEYED A SHAHMEHDI IMPRESSIONS  1. Left ventricular ejection fraction, by estimation, is 60 to 65%. The left ventricle has normal function. The left ventricle has no regional wall motion abnormalities. There is mild concentric left ventricular hypertrophy. Left ventricular diastolic parameters are consistent with Grade II diastolic dysfunction (pseudonormalization). Elevated left atrial pressure.  2. Right ventricular systolic function is normal. The right ventricular size is normal. Tricuspid regurgitation signal is inadequate for assessing PA pressure.  3. The mitral valve is normal in structure. No evidence of mitral valve regurgitation. No evidence of mitral stenosis. Moderate mitral annular calcification.  4. The aortic valve is tricuspid. Aortic valve regurgitation is not visualized. No aortic  stenosis is present.  5. The inferior vena cava is dilated in size with <50% respiratory variability, suggesting right atrial pressure of 15 mmHg. FINDINGS  Left Ventricle: Left ventricular ejection fraction, by estimation, is 60 to 65%. The left ventricle has normal function. The left ventricle has no regional wall motion abnormalities. The left ventricular internal cavity size was normal in size. There is  mild concentric left ventricular hypertrophy. Left ventricular diastolic parameters are consistent with Grade II diastolic dysfunction (pseudonormalization). Elevated left atrial pressure. Right Ventricle: The right ventricular size is normal. No increase in right ventricular wall thickness. Right ventricular systolic function is normal. Tricuspid regurgitation signal is inadequate for assessing PA pressure. Left Atrium: Left atrial size was normal in size. Right Atrium: Right atrial size  was normal in size. Pericardium: There is no evidence of pericardial effusion. Mitral Valve: The mitral valve is normal in structure. Moderate mitral annular calcification. No evidence of mitral valve regurgitation. No evidence of mitral valve stenosis. Tricuspid Valve: The tricuspid valve is normal in structure. Tricuspid valve regurgitation is not demonstrated. Aortic Valve: The aortic valve is tricuspid. Aortic valve regurgitation is not visualized. No aortic stenosis is present. Aortic valve mean gradient measures 7.0 mmHg. Aortic valve peak gradient measures 9.5 mmHg. Aortic valve area, by VTI measures 2.58 cm. Pulmonic Valve: The pulmonic valve was not well visualized. Pulmonic valve regurgitation is not visualized. No evidence of pulmonic stenosis. Aorta: The aortic root and ascending aorta are structurally normal, with no evidence of dilitation. Venous: The inferior vena cava is dilated in size with less than 50% respiratory variability, suggesting right atrial pressure of 15 mmHg. IAS/Shunts: The interatrial septum  was not well visualized.  LEFT VENTRICLE PLAX 2D LVIDd:         5.10 cm   Diastology LVIDs:         2.90 cm   LV e' medial:    7.51 cm/s LV PW:         1.20 cm   LV E/e' medial:  17.0 LV IVS:        1.10 cm   LV e' lateral:   6.31 cm/s LVOT diam:     1.90 cm   LV E/e' lateral: 20.3 LV SV:         88 LV SV Index:   36 LVOT Area:     2.84 cm  RIGHT VENTRICLE             IVC RV S prime:     15.20 cm/s  IVC diam: 2.70 cm TAPSE (M-mode): 3.4 cm LEFT ATRIUM             Index        RIGHT ATRIUM           Index LA Vol (A2C):   35.0 ml 14.55 ml/m  RA Area:     17.90 cm LA Vol (A4C):   60.3 ml 25.06 ml/m  RA Volume:   44.80 ml  18.62 ml/m LA Biplane Vol: 47.0 ml 19.54 ml/m  AORTIC VALVE AV Area (Vmax):    2.52 cm AV Area (Vmean):   2.32 cm AV Area (VTI):     2.58 cm AV Vmax:           154.00 cm/s AV Vmean:          126.000 cm/s AV VTI:            0.339 m AV Peak Grad:      9.5 mmHg AV Mean Grad:      7.0 mmHg LVOT Vmax:         137.00 cm/s LVOT Vmean:        103.000 cm/s LVOT VTI:          0.309 m LVOT/AV VTI ratio: 0.91  AORTA Ao Root diam: 3.10 cm Ao Asc diam:  3.50 cm MITRAL VALVE MV Area (PHT): 2.55 cm     SHUNTS MV Decel Time: 297 msec     Systemic VTI:  0.31 m MV E velocity: 128.00 cm/s  Systemic Diam: 1.90 cm MV A velocity: 117.00 cm/s MV E/A ratio:  1.09 Mihai Croitoru MD Electronically signed by Thurmon Fair MD Signature Date/Time: 03/05/2023/2:33:43 PM    Final    CT HEAD WO  CONTRAST ( )  Result Date: 03/05/2023 CLINICAL DATA:  TIA. EXAM: CT HEAD WITHOUT CONTRAST TECHNIQUE: Contiguous axial images were obtained from the base of the skull through the vertex without intravenous contrast. RADIATION DOSE REDUCTION: This exam was performed according to the departmental dose-optimization program which includes automated exposure control, adjustment of the mA and/or kV according to patient size and/or use of iterative reconstruction technique. COMPARISON:  Brain MRI from yesterday FINDINGS: Brain: No  evidence of acute infarction, hemorrhage, hydrocephalus, extra-axial collection or mass lesion/mass effect. Small chronic left cerebellar infarcts as seen on prior MRI. Cavum velum interpositum. Vascular: No hyperdense vessel or unexpected calcification. Skull: Normal. Negative for fracture or focal lesion. Sinuses/Orbits: No acute finding. IMPRESSION: 1. No acute finding or change from MRI yesterday. 2. Remote left cerebellar infarcts. Electronically Signed   By: Tiburcio Pea M.D.   On: 03/05/2023 04:11   MR BRAIN WO CONTRAST  Result Date: 03/04/2023 CLINICAL DATA:  Neuro deficit, acute, stroke suspected word finding difficulty EXAM: MRI HEAD WITHOUT CONTRAST TECHNIQUE: Multiplanar, multiecho pulse sequences of the brain and surrounding structures were obtained without intravenous contrast. COMPARISON:  CT Head 04/19/14 FINDINGS: Limitations: Motion degraded exam. Brain: Negative for an acute infarct. No hemorrhage. No hydrocephalus. No extra-axial fluid collection. There is chronic infarct in the left cerebellum. Sequela of mild overall chronic microvascular ischemic change. Cavum velum interpositum. Vascular: Normal flow voids. Skull and upper cervical spine: Normal marrow signal. Sinuses/Orbits: No middle ear or mastoid effusion. Mild mucosal thickening in the bilateral ethmoid sinuses. Orbits are unremarkable Other: None. IMPRESSION: Motion degrade exam. 1.  Negative for an acute infarct. 2.  Chronic left cerebellar infarcts. Electronically Signed   By: Lorenza Cambridge M.D.   On: 03/04/2023 17:56   US Venous Img Lower Unilateral Left  Result Date: 03/04/2023 CLINICAL DATA:  Left leg redness EXAM: LEFT LOWER EXTREMITY VENOUS DOPPLER ULTRASOUND TECHNIQUE: Gray-scale sonography with graded compression, as well as color Doppler and duplex ultrasound were performed to evaluate the lower extremity deep venous systems from the level of the common femoral vein and including the common femoral, femoral, profunda  femoral, popliteal and calf veins including the posterior tibial, peroneal and gastrocnemius veins when visible. The superficial great saphenous vein was also interrogated. Spectral Doppler was utilized to evaluate flow at rest and with distal augmentation maneuvers in the common femoral, femoral and popliteal veins. COMPARISON:  02/24/2021 FINDINGS: Contralateral Common Femoral Vein: Respiratory phasicity is normal and symmetric with the symptomatic side. No evidence of thrombus. Normal compressibility. Common Femoral Vein: No evidence of thrombus. Normal compressibility, respiratory phasicity and response to augmentation. Saphenofemoral Junction: No evidence of thrombus. Normal compressibility and flow on color Doppler imaging. Profunda Femoral Vein: No evidence of thrombus. Normal compressibility and flow on color Doppler imaging. Femoral Vein: Partially occlusive thrombus within the mid left femoral vein. Occlusive thrombus within the distal aspect of the left femoral vein. Popliteal Vein: Occlusive thrombus within the left popliteal vein. Calf Veins: Thrombus is seen extending into the posterior tibial vein. Peroneal veins were not well assessed. Superficial Great Saphenous Vein: No evidence of thrombus. Normal compressibility. Venous Reflux:  None. Other Findings:  None. IMPRESSION: Positive for deep venous thrombosis involving the left femoral, popliteal, and posterior tibial veins. Electronically Signed   By: Duanne Guess D.O.   On: 03/04/2023 11:17   DG Ankle Complete Left  Result Date: 03/04/2023 CLINICAL DATA:  Acute left ankle pain after fall yesterday. EXAM: LEFT ANKLE COMPLETE - 3+ VIEW COMPARISON:  None Available. FINDINGS: There is no evidence of fracture, dislocation, or joint effusion. There is no evidence of arthropathy or other focal bone abnormality. Soft tissues are unremarkable. IMPRESSION: Negative. Electronically Signed   By: Lupita Raider M.D.   On: 03/04/2023 09:12     ASSESSMENT:  1.  Unprovoked left leg DVT: - Presentation symptoms: Headaches, dizziness, difficulty concentrating, trouble word finding, left leg swelling redness and tenderness - Left leg Doppler (03/04/2023): Positive DVT involving left femoral, popliteal and posterior tibial veins. - MRI brain without contrast (03/04/2023): Negative for acute infarct.  Chronic left cerebellar infarct.  2.  Social/family history: - Her sister lives with her.  She is independent of ADLs and IADLs.  She works at [employer] on [employer], 12-hour shifts.  She is up and about on her feet during work.  She is a non-smoker. - No family history of DVT.  Father had kidney cancer.  Mother had breast cancer.  2 maternal aunts had bilateral breast cancer.  Maternal great uncle had stomach cancer.  Another maternal aunt had brain tumor.  Paternal uncle had prostate cancer.  1 maternal aunt had 3 miscarriages.  3.  Other medical problems: - HTN, DM2, persistent asthma, GERD, obesity   PLAN:  1.  Unprovoked left leg DVT: - Due to unprovoked nature of the DVT, we have discussed long-term anticoagulation.  She also has other risk factors for recurrence including obesity, venous stasis changes in the lower legs. - Continue Eliquis.  Due to high BMI of 49, recommend factor Xa trough levels 30 minutes prior to next dose. - RTC 4 months with left leg Doppler, D-dimer, lupus anticoagulant, anticardiolipin antibodies and antibeta 2 glycoprotein 1 antibodies.   All questions were answered. The patient knows to call the clinic with any problems, questions or concerns.      Doreatha Massed, MD 03/09/23 7:51 AM

## 2023-03-08 NOTE — Progress Notes (Signed)
D/c instructions provided by charge nurse [facility] RN. Patient d/c home   Demoni Parmar, Kae Heller, RN

## 2023-03-09 LAB — CULTURE, BLOOD (ROUTINE X 2)
Culture: NO GROWTH
Culture: NO GROWTH

## 2023-03-13 ENCOUNTER — Ambulatory Visit (INDEPENDENT_AMBULATORY_CARE_PROVIDER_SITE_OTHER): Payer: 59

## 2023-03-13 DIAGNOSIS — J4540 Moderate persistent asthma, uncomplicated: Secondary | ICD-10-CM | POA: Diagnosis not present

## 2023-03-14 ENCOUNTER — Inpatient Hospital Stay: Payer: 59 | Attending: Hematology

## 2023-03-14 DIAGNOSIS — I82432 Acute embolism and thrombosis of left popliteal vein: Secondary | ICD-10-CM | POA: Insufficient documentation

## 2023-03-14 DIAGNOSIS — I82412 Acute embolism and thrombosis of left femoral vein: Secondary | ICD-10-CM | POA: Diagnosis present

## 2023-03-14 DIAGNOSIS — I82442 Acute embolism and thrombosis of left tibial vein: Secondary | ICD-10-CM | POA: Diagnosis not present

## 2023-03-14 DIAGNOSIS — I824Y2 Acute embolism and thrombosis of unspecified deep veins of left proximal lower extremity: Secondary | ICD-10-CM

## 2023-03-17 ENCOUNTER — Encounter: Payer: Self-pay | Admitting: Physician Assistant

## 2023-03-21 DIAGNOSIS — D239 Other benign neoplasm of skin, unspecified: Secondary | ICD-10-CM | POA: Insufficient documentation

## 2023-03-21 LAB — MISC LABCORP TEST (SEND OUT): Labcorp test code: 504385
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
## 2023-03-22 ENCOUNTER — Ambulatory Visit: Payer: 59 | Admitting: Physician Assistant

## 2023-03-22 ENCOUNTER — Encounter: Payer: Self-pay | Admitting: Physician Assistant

## 2023-03-22 ENCOUNTER — Ambulatory Visit: Payer: 59

## 2023-03-22 VITALS — BP 140/82 | HR 78 | Resp 20 | Ht 65.0 in | Wt 306.0 lb

## 2023-03-22 DIAGNOSIS — R404 Transient alteration of awareness: Secondary | ICD-10-CM | POA: Diagnosis not present

## 2023-03-22 DIAGNOSIS — R0989 Other specified symptoms and signs involving the circulatory and respiratory systems: Secondary | ICD-10-CM | POA: Diagnosis not present

## 2023-03-22 DIAGNOSIS — R413 Other amnesia: Secondary | ICD-10-CM

## 2023-03-22 NOTE — Patient Instructions (Signed)
It was a pleasure to see you today at our office.   Recommendations:    Follow up in 3 months Carotid ultrasound  EEG    For psychiatric meds, mood meds: Please have your primary care physician manage these medications.  If you have any severe symptoms of a stroke, or other severe issues such as confusion,severe chills or fever, etc call 911 or go to the ER as you may need to be evaluated further  For guidance regarding [facility] and if placement were needed at the facility, contact Social Worker tel: [phone]  For assessment of decision of mental capacity and competency:  Call Dr. Erick Blinks, geriatric psychiatrist at [phone]  Counseling regarding caregiver distress, including caregiver depression, anxiety and issues regarding community resources, adult day care programs, adult living facilities, or memory care questions:  please contact your  Primary Doctor's Social Worker   Whom to call: Memory  decline, memory medications: Call our office [phone]    [email or web address]   RECOMMENDATIONS FOR ALL PATIENTS WITH MEMORY PROBLEMS: 1. Continue to exercise (Recommend 30 minutes of walking everyday, or 3 hours every week) 2. Increase social interactions - continue going to [place] and enjoy social gatherings with friends and family 3. Eat healthy, avoid fried foods and eat more fruits and vegetables 4. Maintain adequate blood pressure, blood sugar, and blood cholesterol level. Reducing the risk of stroke and cardiovascular disease also helps promoting better memory. 5. Avoid stressful situations. Live a simple life and avoid aggravations. Organize your time and prepare for the next day in anticipation. 6. Sleep well, avoid any interruptions of sleep and avoid any distractions in the bedroom that may interfere with adequate sleep quality 7. Avoid sugar, avoid sweets as there is a strong link between  excessive sugar intake, diabetes, and cognitive impairment We discussed the Mediterranean diet, which has been shown to help patients reduce the risk of progressive memory disorders and reduces cardiovascular risk. This includes eating fish, eat fruits and green leafy vegetables, nuts like almonds and hazelnuts, walnuts, and also use olive oil. Avoid fast foods and fried foods as much as possible. Avoid sweets and sugar as sugar use has been linked to worsening of memory function.  There is always a concern of gradual progression of memory problems. If this is the case, then we may need to adjust level of care according to patient needs. Support, both to the patient and caregiver, should then be put into place.        FALL PRECAUTIONS: Be cautious when walking. Scan the area for obstacles that may increase the risk of trips and falls. When getting up in the mornings, sit up at the edge of the bed for a few minutes before getting out of bed. Consider elevating the bed at the head end to avoid drop of blood pressure when getting up. Walk always in a well-lit room (use night lights in the walls). Avoid area rugs or power cords from appliances in the middle of the walkways. Use a walker or a cane if necessary and consider physical therapy for balance exercise. Get your eyesight checked regularly.  FINANCIAL OVERSIGHT: Supervision, especially oversight when making financial decisions or transactions is also recommended.  HOME SAFETY: Consider the safety of the kitchen when operating appliances like stoves, microwave oven, and blender. Consider having supervision and share cooking responsibilities until no longer able to participate in those. Accidents with firearms and other hazards in the house should be identified and addressed  as well.   ABILITY TO BE LEFT ALONE: If patient is unable to contact 911 operator, consider using LifeLine, or when the need is there, arrange for someone to stay with patients.  Smoking is a fire hazard, consider supervision or cessation. Risk of wandering should be assessed by caregiver and if detected at any point, supervision and safe proof recommendations should be instituted.  MEDICATION SUPERVISION: Inability to self-administer medication needs to be constantly addressed. Implement a mechanism to ensure safe administration of the medications.      Mediterranean Diet A Mediterranean diet refers to food and lifestyle choices that are based on the traditions of countries located on the [employer]. This way of eating has been shown to help prevent certain conditions and improve outcomes for people who have chronic diseases, like kidney disease and heart disease. What are tips for following this plan? Lifestyle  Cook and eat meals together with your family, when possible. Drink enough fluid to keep your urine clear or pale yellow. Be physically active every day. This includes: Aerobic exercise like running or swimming. Leisure activities like gardening, walking, or housework. Get 7-8 hours of sleep each night. If recommended by your health care provider, drink red wine in moderation. This means 1 glass a day for nonpregnant women and 2 glasses a day for men. A glass of wine equals 5 oz (150 mL). Reading food labels  Check the serving size of packaged foods. For foods such as rice and pasta, the serving size refers to the amount of cooked product, not dry. Check the total fat in packaged foods. Avoid foods that have saturated fat or trans fats. Check the ingredients list for added sugars, such as corn syrup. Shopping  At the grocery store, buy most of your food from the areas near the walls of the store. This includes: Fresh fruits and vegetables (produce). Grains, beans, nuts, and seeds. Some of these may be available in unpackaged forms or large amounts (in bulk). Fresh seafood. Poultry and eggs. Low-fat dairy products. Buy whole ingredients instead of  prepackaged foods. Buy fresh fruits and vegetables in-season from local farmers markets. Buy frozen fruits and vegetables in resealable bags. If you do not have access to quality fresh seafood, buy precooked frozen shrimp or canned fish, such as tuna, salmon, or sardines. Buy small amounts of raw or cooked vegetables, salads, or olives from the deli or salad bar at your store. Stock your pantry so you always have certain foods on hand, such as olive oil, canned tuna, canned tomatoes, rice, pasta, and beans. Cooking  Cook foods with extra-virgin olive oil instead of using butter or other vegetable oils. Have meat as a side dish, and have vegetables or grains as your main dish. This means having meat in small portions or adding small amounts of meat to foods like pasta or stew. Use beans or vegetables instead of meat in common dishes like chili or lasagna. Experiment with different cooking methods. Try roasting or broiling vegetables instead of steaming or sauteing them. Add frozen vegetables to soups, stews, pasta, or rice. Add nuts or seeds for added healthy fat at each meal. You can add these to yogurt, salads, or vegetable dishes. Marinate fish or vegetables using olive oil, lemon juice, garlic, and fresh herbs. Meal planning  Plan to eat 1 vegetarian meal one day each week. Try to work up to 2 vegetarian meals, if possible. Eat seafood 2 or more times a week. Have healthy snacks readily available, such  as: Vegetable sticks with hummus. Greek yogurt. Fruit and nut trail mix. Eat balanced meals throughout the week. This includes: Fruit: 2-3 servings a day Vegetables: 4-5 servings a day Low-fat dairy: 2 servings a day Fish, poultry, or lean meat: 1 serving a day Beans and legumes: 2 or more servings a week Nuts and seeds: 1-2 servings a day Whole grains: 6-8 servings a day Extra-virgin olive oil: 3-4 servings a day Limit red meat and sweets to only a few servings a month What are my  food choices? Mediterranean diet Recommended Grains: Whole-grain pasta. Brown rice. Bulgar wheat. Polenta. Couscous. Whole-wheat bread. Orpah Cobb. Vegetables: Artichokes. Beets. Broccoli. Cabbage. Carrots. Eggplant. Green beans. Chard. Kale. Spinach. Onions. Leeks. Peas. Squash. Tomatoes. Peppers. Radishes. Fruits: Apples. Apricots. Avocado. Berries. Bananas. Cherries. Dates. Figs. Grapes. Lemons. Melon. Oranges. Peaches. Plums. Pomegranate. Meats and other protein foods: Beans. Almonds. Sunflower seeds. Pine nuts. Peanuts. Cod. Salmon. Scallops. Shrimp. Tuna. Tilapia. Clams. Oysters. Eggs. Dairy: Low-fat milk. Cheese. Greek yogurt. Beverages: Water. Red wine. Herbal tea. Fats and oils: Extra virgin olive oil. Avocado oil. Grape seed oil. Sweets and desserts: [place] yogurt with honey. Baked apples. Poached pears. Trail mix. Seasoning and other foods: Basil. Cilantro. Coriander. Cumin. Mint. Parsley. Sage. Rosemary. Tarragon. Garlic. Oregano. Thyme. Pepper. Balsalmic vinegar. Tahini. Hummus. Tomato sauce. Olives. Mushrooms. Limit these Grains: Prepackaged pasta or rice dishes. Prepackaged cereal with added sugar. Vegetables: Deep fried potatoes (french fries). Fruits: Fruit canned in syrup. Meats and other protein foods: Beef. Pork. Lamb. Poultry with skin. Hot dogs. Tomasa Blase. Dairy: Ice cream. Sour cream. Whole milk. Beverages: Juice. Sugar-sweetened soft drinks. Beer. Liquor and spirits. Fats and oils: Butter. Canola oil. Vegetable oil. Beef fat (tallow). Lard. Sweets and desserts: Cookies. Cakes. Pies. Candy. Seasoning and other foods: Mayonnaise. Premade sauces and marinades. The items listed may not be a complete list. Talk with your dietitian about what dietary choices are right for you. Summary The Mediterranean diet includes both food and lifestyle choices. Eat a variety of fresh fruits and vegetables, beans, nuts, seeds, and whole grains. Limit the amount of red meat and sweets  that you eat. Talk with your health care provider about whether it is safe for you to drink red wine in moderation. This means 1 glass a day for nonpregnant women and 2 glasses a day for men. A glass of wine equals 5 oz (150 mL). This information is not intended to replace advice given to you by your health care provider. Make sure you discuss any questions you have with your health care provider. Document Released: 04/21/2016 Document Revised: 05/24/2016 Document Reviewed: 04/21/2016 Elsevier Interactive Patient Education  2017 [employer].     For migraines  Limit use of pain relievers to no more than 2 days out of the week.  These medications include acetaminophen, NSAIDs (ibuprofen/Advil/Motrin, naproxen/Aleve, triptans (Imitrex/sumatriptan), Excedrin, and narcotics.  This will help reduce risk of rebound headaches. Be aware of common food triggers:  - Caffeine:  coffee, black tea, cola, Mt. Dew  - Chocolate  - Dairy:  aged cheeses (brie, blue, cheddar, gouda, [place], provolone, [place], Swiss, etc), chocolate milk, buttermilk, sour cream, limit eggs and yogurt  - Nuts, peanut butter  - Alcohol  - Cereals/grains:  FRESH breads (fresh bagels, sourdough, doughnuts), yeast productions  - Processed/canned/aged/cured meats (pre-packaged deli meats, hotdogs)  - MSG/glutamate:  soy sauce, flavor enhancer, pickled/preserved/marinated foods  - Sweeteners:  aspartame (Equal, Nutrasweet).  Sugar and Splenda are okay  - Vegetables:  legumes (lima beans,  lentils, snow peas, fava beans, pinto peans, peas, garbanzo beans), sauerkraut, onions, olives, pickles  - Fruit:  avocados, bananas, citrus fruit (orange, lemon, grapefruit), mango  - Other:  Frozen meals, macaroni and cheese Routine exercise Stay adequately hydrated (aim for 64 oz water daily) Keep headache diary Maintain proper stress management Maintain proper sleep hygiene Do not skip meals Consider supplements:  magnesium citrate 400mg   daily, riboflavin 400mg  daily, coenzyme Q10 100mg  three times daily.

## 2023-03-23 ENCOUNTER — Telehealth: Payer: Self-pay

## 2023-03-23 NOTE — Telephone Encounter (Signed)
Received Disability paperwork from [place]. Unable to complete paperwork due to diagnosis that Dr. Ellin Saba sees her for. Based off patient's symptoms, patient notified to contact PCP or neurology to complete forms. Patient verbalized understanding.

## 2023-03-29 ENCOUNTER — Ambulatory Visit: Payer: 59 | Admitting: Neurology

## 2023-03-29 ENCOUNTER — Telehealth: Payer: Self-pay | Admitting: *Deleted

## 2023-03-29 DIAGNOSIS — R404 Transient alteration of awareness: Secondary | ICD-10-CM | POA: Diagnosis not present

## 2023-03-29 NOTE — Telephone Encounter (Signed)
Received call from patient's CM with [facility] with concerns that patient has been taking Eliquis 10 mg bid, rather than 5 mg, as prescribed.  Reached out to patient who clarified that she is only taking 5 mg dose.

## 2023-03-30 ENCOUNTER — Ambulatory Visit: Payer: 59

## 2023-03-30 ENCOUNTER — Ambulatory Visit: Payer: 59 | Admitting: Physician Assistant

## 2023-04-03 ENCOUNTER — Other Ambulatory Visit: Payer: 59

## 2023-04-03 NOTE — Procedures (Signed)
ELECTROENCEPHALOGRAM REPORT  Date of Study: 03/29/2023  Patient's Name: ELLICE BOULTINGHOUSE MRN: [id number] Date of Birth: July 05, 1961  Referring Provider: Marlowe Kays, PA-C  Clinical History: This is a 62 year old woman with episode of shaking and memory loss. EEG for classification.  Medications: Xanax, Eliquis, Benralizumab,Lexapro, Lasix, Cozaar, Glucophage,Crestor  Technical Summary: A multichannel digital 1-hour EEG recording measured by the international 10-20 system with electrodes applied with paste and impedances below 5000 ohms performed in our laboratory with EKG monitoring in an awake and asleep patient.  Hyperventilation was not performed. Photic stimulation was performed.  The digital EEG was referentially recorded, reformatted, and digitally filtered in a variety of bipolar and referential montages for optimal display.    Description: The patient is awake and asleep during the recording.  During maximal wakefulness, there is a symmetric, medium voltage 10 Hz posterior dominant rhythm that attenuates with eye opening.  The record is symmetric.  During drowsiness and sleep, there is an increase in theta slowing of the background.  Vertex waves and symmetric sleep spindles were seen. Photic stimulation did not elicit any abnormalities.  There were no epileptiform discharges or electrographic seizures seen.    EKG lead showed occasional extrasystolic beats.  Impression: This 1-hour awake and asleep EEG is normal.    Clinical Correlation: A normal EEG does not exclude a clinical diagnosis of epilepsy.  If further clinical questions remain, prolonged EEG may be helpful.  Clinical correlation is advised.   Patrcia Dolly, M.D.

## 2023-04-04 ENCOUNTER — Encounter: Payer: 59 | Admitting: Hematology

## 2023-04-04 NOTE — Progress Notes (Signed)
EEG is normal, no seizure activity was seen, thanks

## 2023-04-05 ENCOUNTER — Encounter: Payer: Self-pay | Admitting: Allergy & Immunology

## 2023-04-05 ENCOUNTER — Ambulatory Visit: Payer: 59 | Admitting: Allergy & Immunology

## 2023-04-05 ENCOUNTER — Other Ambulatory Visit (HOSPITAL_COMMUNITY): Payer: Self-pay

## 2023-04-05 VITALS — BP 132/82 | HR 83 | Temp 98.3°F | Ht 65.0 in | Wt 308.5 lb

## 2023-04-05 DIAGNOSIS — J302 Other seasonal allergic rhinitis: Secondary | ICD-10-CM | POA: Diagnosis not present

## 2023-04-05 DIAGNOSIS — J3089 Other allergic rhinitis: Secondary | ICD-10-CM | POA: Diagnosis not present

## 2023-04-05 DIAGNOSIS — K219 Gastro-esophageal reflux disease without esophagitis: Secondary | ICD-10-CM

## 2023-04-05 DIAGNOSIS — J4540 Moderate persistent asthma, uncomplicated: Secondary | ICD-10-CM | POA: Diagnosis not present

## 2023-04-05 MED ORDER — ALBUTEROL SULFATE (2.5 MG/3ML) 0.083% IN NEBU: 2.5000 mg | INHALATION_SOLUTION | Freq: Four times a day (QID) | RESPIRATORY_TRACT | 2 refills | Status: DC | PRN

## 2023-04-05 MED ORDER — OMEPRAZOLE 20 MG PO CPDR: 20.0000 mg | DELAYED_RELEASE_CAPSULE | Freq: Every day | ORAL | 1 refills | Status: AC

## 2023-04-05 MED ORDER — BREZTRI AEROSPHERE 160-9-4.8 MCG/ACT IN AERO: 1.0000 | INHALATION_SPRAY | Freq: Every day | RESPIRATORY_TRACT | 1 refills | Status: DC

## 2023-04-05 NOTE — Patient Instructions (Addendum)
1. Moderate persistent asthma, uncomplicated - Lung function looked fairly stable. - We are not going to make any medication changes at this point in time.  - Daily controller medication(s): Breztri two puffs twice daily and Fasenra every 8 weeks - Prior to physical activity: albuterol 2 puffs 10-15 minutes before physical activity. - Rescue medications: albuterol 4 puffs every 4-6 hours as needed - Asthma control goals:  * Full participation in all desired activities (may need albuterol before activity) * Albuterol use two time or less a week on average (not counting use with activity) * Cough interfering with sleep two time or less a month * Oral steroids no more than once a year * No hospitalizations  2. Chronic rhinitis - Previous testing showed: grasses, trees, indoor molds, outdoor molds, dust mites, cat, dog, and cockroach - Continue with: Zyrtec 10mg  daily  - Continue with: Nasacort (triamcinolone) one spray per nostril daily AS NEEDED  - You can use an extra dose of the antihistamine, if needed, for breakthrough symptoms.  - Consider nasal saline rinses 1-2 times daily to remove allergens from the nasal cavities as well as help with mucous clearance (this is especially helpful to do before the nasal sprays are given) - Consider allergy shots as a means of long-term control. runny nose, nasal congestion, etc).     3. Rash - Continue to follow with Dr. Randye Lobo.  - She is fantastic, so be sure to keep those appointments so you stay currently.   4. GERD  - Continue on omeprazole daily.   4. Return in about 6 months (around 10/06/2023).    Please inform us of any Emergency Department visits, hospitalizations, or changes in symptoms. Call us before going to the ED for breathing or allergy symptoms since we might be able to fit you in for a sick visit. Feel free to contact us anytime with any questions, problems, or concerns.  It was a pleasure to meet you today!  Websites  that have reliable patient information: 1. American Academy of Asthma, Allergy, and Immunology: www.aaaai.org 2. Food Allergy Research and Education (FARE): foodallergy.org 3. Mothers of Asthmatics: http://www.asthmacommunitynetwork.[email or web address] 4. American College of Allergy, Asthma, and Immunology: www.acaai.org   COVID-19 Vaccine Information can be found at: [email or web address] For questions related to vaccine distribution or appointments, please email [email or web address] or call [phone].   We realize that you might be concerned about having an allergic reaction to the COVID19 vaccines. To help with that concern, WE ARE OFFERING THE COVID19 VACCINES IN OUR OFFICE! Ask the front desk for dates!     "Like" [place] on Facebook and Instagram for our latest updates!      A healthy democracy works best when [employer] participate! Make sure you are registered to vote! If you have moved or changed any of your contact information, you will need to get this updated before voting!  In some cases, you MAY be able to register to vote online: [email or web address]

## 2023-04-05 NOTE — Progress Notes (Signed)
FOLLOW UP  Date of Service/Encounter:  04/05/23   Assessment:   Moderate persistent asthma, uncomplicated - with AEC 400, doing well symptomatically on Fasenra    Perennial and seasonal allergic rhinitis (grasses, trees, indoor molds, outdoor molds, dust mites, cat, dog, and cockroach)   Venous stasis dermatitis - followed by Dr. Randye Molina    Plan/Recommendations:   1. Moderate persistent asthma, uncomplicated - Lung function looked fairly stable. - We are not going to make any medication changes at this point in time.  - Daily controller medication(s): Breztri two puffs twice daily and Fasenra every 8 weeks - Prior to physical activity: albuterol 2 puffs 10-15 minutes before physical activity. - Rescue medications: albuterol 4 puffs every 4-6 hours as needed - Asthma control goals:  * Full participation in all desired activities (may need albuterol before activity) * Albuterol use two time or less a week on average (not counting use with activity) * Cough interfering with sleep two time or less a month * Oral steroids no more than once a year * No hospitalizations  2. Chronic rhinitis - Previous testing showed: grasses, trees, indoor molds, outdoor molds, dust mites, cat, dog, and cockroach - Continue with: Zyrtec 10mg  daily  - Continue with: Nasacort (triamcinolone) one spray per nostril daily AS NEEDED  - You can use an extra dose of the antihistamine, if needed, for breakthrough symptoms.  - Consider nasal saline rinses 1-2 times daily to remove allergens from the nasal cavities as well as help with mucous clearance (this is especially helpful to do before the nasal sprays are given) - Consider allergy shots as a means of long-term control. runny nose, nasal congestion, etc).     3. Rash - Continue to follow with Dr. Randye Molina.  - She is fantastic, so be sure to keep those appointments so you stay currently.   4. GERD  - Continue on omeprazole daily.   4. Return  in about 6 months (around 10/06/2023).   Subjective:   Amy Molina is a 62 y.o. female presenting today for follow up of  Chief Complaint  Patient presents with   Asthma    Amy Molina has a history of the following: Patient Active Problem List   Diagnosis Date Noted   Memory difficulties 03/22/2023   Lower extremity cellulitis 03/04/2023   Altered mental state 03/04/2023    Class: Acute   DVT (deep venous thrombosis) (HCC) 03/04/2023    Class: Acute   Hyperlipidemia 03/04/2023   Hypokalemia 03/04/2023   SIRS (systemic inflammatory response syndrome) (HCC) 03/04/2023   Seasonal and perennial allergic rhinitis 04/01/2022   Severe persistent asthma without complication 04/01/2022   Chronic rhinitis 06/02/2021   Moderate persistent asthma, uncomplicated 06/02/2021   Anxiety 06/17/2020   Asthmatic bronchitis , chronic 06/17/2020   Gastroesophageal reflux disease 04/29/2020   Abnormal CT scan, sigmoid colon    Benign neoplasm of ascending colon    [name]    Tubo-ovarian abscess 07/21/2017   Uncontrolled type 2 diabetes mellitus with hyperglycemia (HCC) 07/21/2017   Benign neoplasm of rectum    Fibroid uterus 10/22/2014   Hypertension 03/19/2012   Diabetes mellitus, type 2 (HCC) 03/19/2012   Obesity, morbid (HCC) 03/19/2012    History obtained from: chart review and patient.  Amy Molina is a 62 y.o. female presenting for a follow up visit.  She was last seen in January 2024.  At that time, her lung function was in the 50% range and  improved slightly with the albuterol.  We gave her a sample of Breztri.  We continued with albuterol as needed.  She also remained on Fasenra every 8 weeks.  For her rhinitis, we continue with a daily antihistamine and Nasacort.  We talked about allergy shots for long-term control.  For her GERD, we continue with omeprazole daily.  Since last visit, she has done very well.   Asthma/Respiratory Symptom History: She has had some  worsening breathing symptoms with the heat and humidity. She remains on the [place] and the Breztri two puffs BID. She is getting this for free. She does not recall the last time that she got prednisone for her breathing. She reports that she has trouble laying in bed, but she does better in the recliner. She does not have a history of heart problems.   She did have a normal echocardiogram in June 2024:       Allergic Rhinitis Symptom History: She remains on the cetirizine 10mg  daily as well as Nasacort. She has not needed any antibiotics or prednisone for her symptoms. She is overall doing very well. Her breathing issues are her main complaint overall, so her allergic rhinitis is less of a concern for her.  Skin Symptom History: She sees Dr. Gloris Molina.  Her last visit was in July 2024 on the eighth.  At that time, she was diagnosed with venous stasis dermatitis.  She was continued on triamcinolone 0.025% ointment for mild flares and compression stockings.  It was recommended that she begin Ace wraps or TED hoses for her legs at night.  She was recently hospitalized from a seizure or something else. She was found to have clot in her leg. It did not travel in her lungs, but instead went to her foot.   She has some lingering effects of a possible stroke. She is seeing a Insurance account manager and she is seeing Dr. Kirtland Molina for her blood clot.   Otherwise, there have been no changes to her past medical history, surgical history, family history, or social history.    Review of systems otherwise negative other than that mentioned in the HPI.    Objective:   Blood pressure 132/82, pulse 83, temperature 98.3 F (36.8 C), height 5\' 5"  (1.651 m), weight (!) 308 lb 8 oz (139.9 kg), last menstrual period 08/09/2016, SpO2 93%. Body mass index is 51.34 kg/m.    Physical Exam Vitals reviewed.  Constitutional:      Appearance: She is well-developed.     Comments: Lovely and pleasant. Very pleasant. Smiling.    HENT:     Head: Normocephalic and atraumatic.     Right Ear: Tympanic membrane, ear canal and external ear normal. No drainage, swelling or tenderness. Tympanic membrane is not injected, scarred, erythematous, retracted or bulging.     Left Ear: Tympanic membrane, ear canal and external ear normal. No drainage, swelling or tenderness. Tympanic membrane is not injected, scarred, erythematous, retracted or bulging.     Nose: No nasal deformity, septal deviation, mucosal edema or rhinorrhea.     Right Turbinates: Enlarged, swollen and pale.     Left Turbinates: Enlarged, swollen and pale.     Right Sinus: No maxillary sinus tenderness or frontal sinus tenderness.     Left Sinus: No maxillary sinus tenderness or frontal sinus tenderness.     Comments: No nasal polyps noted.     Mouth/Throat:     Lips: Pink.     Mouth: Mucous membranes are moist. Mucous membranes are  not pale and not dry.     Pharynx: Uvula midline.  Eyes:     General:        Right eye: No discharge.        Left eye: No discharge.     Conjunctiva/sclera: Conjunctivae normal.     Right eye: Right conjunctiva is not injected. No chemosis.    Left eye: Left conjunctiva is not injected. No chemosis.    Pupils: Pupils are equal, round, and reactive to light.  Cardiovascular:     Rate and Rhythm: Normal rate and regular rhythm.     Heart sounds: Normal heart sounds.  Pulmonary:     Effort: Pulmonary effort is normal. No tachypnea, accessory muscle usage or respiratory distress.     Breath sounds: Normal breath sounds. No wheezing, rhonchi or rales.     Comments: Moving air well in all lung fields. No increased work of breathing.  Chest:     Chest wall: No tenderness.  Lymphadenopathy:     Head:     Right side of head: No submandibular, tonsillar or occipital adenopathy.     Left side of head: No submandibular, tonsillar or occipital adenopathy.     Cervical: No cervical adenopathy.  Skin:    General: Skin is warm.      Capillary Refill: Capillary refill takes less than 2 seconds.     Coloration: Skin is not pale.     Findings: No abrasion, erythema, petechiae or rash. Rash is not papular, urticarial or vesicular.     Comments: No crackles or wheezes noted.   Neurological:     Mental Status: She is alert.  Psychiatric:        Behavior: Behavior is cooperative.      Diagnostic studies:    Spirometry: results normal (FEV1: 1.66/74%, FVC: 1.89/67%, FEV1/FVC: 88%).    Spirometry consistent with normal pattern.    Allergy Studies: none        Malachi Bonds, MD  [facility]

## 2023-04-11 ENCOUNTER — Other Ambulatory Visit: Payer: Self-pay | Admitting: Allergy & Immunology

## 2023-04-18 ENCOUNTER — Ambulatory Visit: Admission: RE | Admit: 2023-04-18 | Payer: 59 | Source: Ambulatory Visit

## 2023-04-18 DIAGNOSIS — R0989 Other specified symptoms and signs involving the circulatory and respiratory systems: Secondary | ICD-10-CM

## 2023-04-19 NOTE — Progress Notes (Signed)
Ultrasound of the leg arteries is unremarkable, no acute findings, no occlusion.  Thank you

## 2023-04-21 ENCOUNTER — Telehealth: Payer: Self-pay | Admitting: Physician Assistant

## 2023-04-21 NOTE — Telephone Encounter (Signed)
Left message with the after hour service on 04-20-23 at 2:17 pm  Caller states that she is returning two calls to our office

## 2023-06-22 ENCOUNTER — Encounter: Payer: Self-pay | Admitting: Physician Assistant

## 2023-06-22 ENCOUNTER — Ambulatory Visit (INDEPENDENT_AMBULATORY_CARE_PROVIDER_SITE_OTHER): Payer: 59 | Admitting: Physician Assistant

## 2023-06-22 VITALS — BP 138/60 | HR 73 | Resp 18 | Ht 65.0 in | Wt 312.0 lb

## 2023-06-22 DIAGNOSIS — R413 Other amnesia: Secondary | ICD-10-CM

## 2023-06-22 NOTE — Patient Instructions (Signed)
It was a pleasure to see you today at our office.   Recommendations:    Follow up in 1 year    For psychiatric meds, mood meds: Please have your primary care physician manage these medications.  If you have any severe symptoms of a stroke, or other severe issues such as confusion,severe chills or fever, etc call 911 or go to the ER as you may need to be evaluated further  ase contact your  Primary Doctor's Social Worker   Whom to call: Memory  decline, memory medications: Call our office [phone]    [email or web address]   RECOMMENDATIONS FOR ALL PATIENTS WITH MEMORY PROBLEMS: 1. Continue to exercise (Recommend 30 minutes of walking everyday, or 3 hours every week) 2. Increase social interactions - continue going to [place] and enjoy social gatherings with friends and family 3. Eat healthy, avoid fried foods and eat more fruits and vegetables 4. Maintain adequate blood pressure, blood sugar, and blood cholesterol level. Reducing the risk of stroke and cardiovascular disease also helps promoting better memory. 5. Avoid stressful situations. Live a simple life and avoid aggravations. Organize your time and prepare for the next day in anticipation. 6. Sleep well, avoid any interruptions of sleep and avoid any distractions in the bedroom that may interfere with adequate sleep quality 7. Avoid sugar, avoid sweets as there is a strong link between excessive sugar intake, diabetes, and cognitive impairment We discussed the Mediterranean diet, which has been shown to help patients reduce the risk of progressive memory disorders and reduces cardiovascular risk. This includes eating fish, eat fruits and green leafy vegetables, nuts like almonds and hazelnuts, walnuts, and also use olive oil. Avoid fast foods and fried foods as much as possible. Avoid sweets and sugar as sugar use has been linked to worsening of memory function.  There is always  a concern of gradual progression of memory problems. If this is the case, then we may need to adjust level of care according to patient needs. Support, both to the patient and caregiver, should then be put into place.        FALL PRECAUTIONS: Be cautious when walking. Scan the area for obstacles that may increase the risk of trips and falls. When getting up in the mornings, sit up at the edge of the bed for a few minutes before getting out of bed. Consider elevating the bed at the head end to avoid drop of blood pressure when getting up. Walk always in a well-lit room (use night lights in the walls). Avoid area rugs or power cords from appliances in the middle of the walkways. Use a walker or a cane if necessary and consider physical therapy for balance exercise. Get your eyesight checked regularly.  FINANCIAL OVERSIGHT: Supervision, especially oversight when making financial decisions or transactions is also recommended.  HOME SAFETY: Consider the safety of the kitchen when operating appliances like stoves, microwave oven, and blender. Consider having supervision and share cooking responsibilities until no longer able to participate in those. Accidents with firearms and other hazards in the house should be identified and addressed as well.   ABILITY TO BE LEFT ALONE: If patient is unable to contact 911 operator, consider using LifeLine, or when the need is there, arrange for someone to stay with patients. Smoking is a fire hazard, consider supervision or cessation. Risk of wandering should be assessed by caregiver and if detected at any point, supervision and safe proof recommendations should be instituted.  MEDICATION SUPERVISION: Inability to self-administer  medication needs to be constantly addressed. Implement a mechanism to ensure safe administration of the medications.      Mediterranean Diet A Mediterranean diet refers to food and lifestyle choices that are based on the traditions of  countries located on the [employer] [place]. This way of eating has been shown to help prevent certain conditions and improve outcomes for people who have chronic diseases, like kidney disease and heart disease. What are tips for following this plan? Lifestyle  Cook and eat meals together with your family, when possible. Drink enough fluid to keep your urine clear or pale yellow. Be physically active every day. This includes: Aerobic exercise like running or swimming. Leisure activities like gardening, walking, or housework. Get 7-8 hours of sleep each night. If recommended by your health care provider, drink red wine in moderation. This means 1 glass a day for nonpregnant women and 2 glasses a day for men. A glass of wine equals 5 oz (150 mL). Reading food labels  Check the serving size of packaged foods. For foods such as rice and pasta, the serving size refers to the amount of cooked product, not dry. Check the total fat in packaged foods. Avoid foods that have saturated fat or trans fats. Check the ingredients list for added sugars, such as corn syrup. Shopping  At the grocery store, buy most of your food from the areas near the walls of the store. This includes: Fresh fruits and vegetables (produce). Grains, beans, nuts, and seeds. Some of these may be available in unpackaged forms or large amounts (in bulk). Fresh seafood. Poultry and eggs. Low-fat dairy products. Buy whole ingredients instead of prepackaged foods. Buy fresh fruits and vegetables in-season from local farmers markets. Buy frozen fruits and vegetables in resealable bags. If you do not have access to quality fresh seafood, buy precooked frozen shrimp or canned fish, such as tuna, salmon, or sardines. Buy small amounts of raw or cooked vegetables, salads, or olives from the deli or salad bar at your store. Stock your pantry so you always have certain foods on hand, such as olive oil, canned tuna, canned tomatoes, rice,  pasta, and beans. Cooking  Cook foods with extra-virgin olive oil instead of using butter or other vegetable oils. Have meat as a side dish, and have vegetables or grains as your main dish. This means having meat in small portions or adding small amounts of meat to foods like pasta or stew. Use beans or vegetables instead of meat in common dishes like chili or lasagna. Experiment with different cooking methods. Try roasting or broiling vegetables instead of steaming or sauteing them. Add frozen vegetables to soups, stews, pasta, or rice. Add nuts or seeds for added healthy fat at each meal. You can add these to yogurt, salads, or vegetable dishes. Marinate fish or vegetables using olive oil, lemon juice, garlic, and fresh herbs. Meal planning  Plan to eat 1 vegetarian meal one day each week. Try to work up to 2 vegetarian meals, if possible. Eat seafood 2 or more times a week. Have healthy snacks readily available, such as: Vegetable sticks with hummus. Greek yogurt. Fruit and nut trail mix. Eat balanced meals throughout the week. This includes: Fruit: 2-3 servings a day Vegetables: 4-5 servings a day Low-fat dairy: 2 servings a day Fish, poultry, or lean meat: 1 serving a day Beans and legumes: 2 or more servings a week Nuts and seeds: 1-2 servings a day Whole grains: 6-8 servings a day Extra-virgin olive oil:  3-4 servings a day Limit red meat and sweets to only a few servings a month What are my food choices? Mediterranean diet Recommended Grains: Whole-grain pasta. Brown rice. Bulgar wheat. Polenta. Couscous. Whole-wheat bread. Orpah Cobb. Vegetables: Artichokes. Beets. Broccoli. Cabbage. Carrots. Eggplant. Green beans. Chard. Kale. Spinach. Onions. Leeks. Peas. Squash. Tomatoes. Peppers. Radishes. Fruits: Apples. Apricots. Avocado. Berries. Bananas. Cherries. Dates. Figs. Grapes. Lemons. Melon. Oranges. Peaches. Plums. Pomegranate. Meats and other protein foods: Beans.  Almonds. Sunflower seeds. Pine nuts. Peanuts. Cod. Salmon. Scallops. Shrimp. Tuna. Tilapia. Clams. Oysters. Eggs. Dairy: Low-fat milk. Cheese. Greek yogurt. Beverages: Water. Red wine. Herbal tea. Fats and oils: Extra virgin olive oil. Avocado oil. Grape seed oil. Sweets and desserts: [place] yogurt with honey. Baked apples. Poached pears. Trail mix. Seasoning and other foods: Basil. Cilantro. Coriander. Cumin. Mint. Parsley. Sage. Rosemary. Tarragon. Garlic. Oregano. Thyme. Pepper. Balsalmic vinegar. Tahini. Hummus. Tomato sauce. Olives. Mushrooms. Limit these Grains: Prepackaged pasta or rice dishes. Prepackaged cereal with added sugar. Vegetables: Deep fried potatoes (french fries). Fruits: Fruit canned in syrup. Meats and other protein foods: Beef. Pork. Lamb. Poultry with skin. Hot dogs. Tomasa Blase. Dairy: Ice cream. Sour cream. Whole milk. Beverages: Juice. Sugar-sweetened soft drinks. Beer. Liquor and spirits. Fats and oils: Butter. Canola oil. Vegetable oil. Beef fat (tallow). Lard. Sweets and desserts: Cookies. Cakes. Pies. Candy. Seasoning and other foods: Mayonnaise. Premade sauces and marinades. The items listed may not be a complete list. Talk with your dietitian about what dietary choices are right for you. Summary The Mediterranean diet includes both food and lifestyle choices. Eat a variety of fresh fruits and vegetables, beans, nuts, seeds, and whole grains. Limit the amount of red meat and sweets that you eat. Talk with your health care provider about whether it is safe for you to drink red wine in moderation. This means 1 glass a day for nonpregnant women and 2 glasses a day for men. A glass of wine equals 5 oz (150 mL). This information is not intended to replace advice given to you by your health care provider. Make sure you discuss any questions you have with your health care provider. Document Released: 04/21/2016 Document Revised: 05/24/2016 Document Reviewed:  04/21/2016 Elsevier Interactive Patient Education  2017 [employer].     For migraines  Limit use of pain relievers to no more than 2 days out of the week.  These medications include acetaminophen, NSAIDs (ibuprofen/Advil/Motrin, naproxen/Aleve, triptans (Imitrex/sumatriptan), Excedrin, and narcotics.  This will help reduce risk of rebound headaches. Be aware of common food triggers:  - Caffeine:  coffee, black tea, cola, Mt. Dew  - Chocolate  - Dairy:  aged cheeses (brie, blue, cheddar, gouda, [place], provolone, [place], Swiss, etc), chocolate milk, buttermilk, sour cream, limit eggs and yogurt  - Nuts, peanut butter  - Alcohol  - Cereals/grains:  FRESH breads (fresh bagels, sourdough, doughnuts), yeast productions  - Processed/canned/aged/cured meats (pre-packaged deli meats, hotdogs)  - MSG/glutamate:  soy sauce, flavor enhancer, pickled/preserved/marinated foods  - Sweeteners:  aspartame (Equal, Nutrasweet).  Sugar and Splenda are okay  - Vegetables:  legumes (lima beans, lentils, snow peas, fava beans, pinto peans, peas, garbanzo beans), sauerkraut, onions, olives, pickles  - Fruit:  avocados, bananas, citrus fruit (orange, lemon, grapefruit), mango  - Other:  Frozen meals, macaroni and cheese Routine exercise Stay adequately hydrated (aim for 64 oz water daily) Keep headache diary Maintain proper stress management Maintain proper sleep hygiene Do not skip meals Consider supplements:  magnesium citrate 400mg  daily, riboflavin 400mg   daily, coenzyme Q10 100mg  three times daily.

## 2023-06-22 NOTE — Progress Notes (Signed)
Assessment/Plan:   Memory  Concerns   Amy Molina is a very pleasant 62 y.o. RH female with a history of hypertension, hyperlipidemia, GERD, DM2, anxiety,depression, asthma, chronic venous stasis with recent DVT LLE (involving the FT /P and PT vein) and SIRS  presenting today in follow-up for evaluation of memory concerns Patient is not on antidementia medication. She reports that her memory is better. MMSE today is 30/30. EEG was normal. Patient is able to participate on his ADLs and to drive without difficulties. She is compliant with her cardiac meds.        Recommendations:   Follow up in 1 year  No antidementia medication is indicated at this time  Recommend good control of cardiovascular risk factors.Continue Eliquis for LLE DVT and follow with hematology Continue to control mood as per PCP    Subjective:   This patient is here alone.  Previous records as well as any outside records available were reviewed prior to todays visit.   Patient was last seen on 03/2023 with [facility] 24/30     Any changes in memory since last visit? "A little better, especially with dates, names, and appointments ". "I am able to find the words easier". LTM is good. repeats oneself?  Endorsed, but "it is better". Disoriented when walking into a room?  Patient denies Leaving objects in unusual places?  Patient denies   Wandering behavior?   denies   Any personality changes since last visit? Denies.   Any worsening depression?: denies."It is better than before"   Hallucinations or paranoia?  Denies.   Seizures?   Denies.    Any sleep changes? Sleeps well. Denies vivid dreams, REM behavior or sleepwalking   Sleep apnea?   denies    Any hygiene concerns?   denies   Independent of bathing and dressing?  Endorsed  Does the patient needs help with medications? Patient is in charge   Who is in charge of the finances?  Patient is in charge     Any changes in appetite?  Denies.     Patient have trouble  swallowing?  Denies.   Does the patient cook? Yes  Any kitchen accidents such as leaving the stove on?   denies   Any headaches?    Denies.   Vision changes? Denies. Chronic pain?  denies   Ambulates with difficulty?  Denies.    Recent falls or head injuries?  Denies.      Unilateral weakness, numbness or tingling?   Denies.   Any tremors?  Denies.   Any anosmia?    denies   Any incontinence of urine?  Denies.   Any bowel dysfunction?  Denies.      Patient lives with sister and brother in law     Does the patient drive? Yes, denies any issues    Initial visit 7/10/204 How long did patient have memory difficulties?  She denies memory issues prior to her acute confusional state in 02/2023. "Sometimes I have to grasp for the right word, but it is  better than in the hospital", "I have no recollection of being there".  "Is the short term rather than the long term"  repeats oneself?  Endorsed, "after the hospitalization, not before". Disoriented when walking into a room?  Patient denies   Leaving objects in unusual places? denies   Wandering behavior?  denies   Any personality changes?  Patient denies   Any history of depression?:  Endorsed, she takes antidepressants Hallucinations or  paranoia?  "After the hospitalization I was driving and felt that cars were coming towards me". Seizures?  "I was shaking and teeth shattering before the hospital, lasting for about 1 hour. After that I felt drained. Was able to recognize my family members". Denies any other seizure like activity.      Any sleep changes? Sleeps well,  without vivid dreams, REM behavior or sleepwalking   Sleep apnea?  Patient denies   Any hygiene concerns?  Patient denies   Independent of bathing and dressing?  Endorsed  Does the patient needs help with medications? Patient is in charge   Who is in charge of the finances? Patient is in charge     Any changes in appetite?  denies     Patient have trouble swallowing? denies    Does the patient cook?  Yes, denies any issues  Any kitchen accidents such as leaving the stove on? denies   Any headaches?   denies   Chronic back pain ? denies   Ambulates with difficulty?  denies   Recent falls or head injuries? Denies. History of concussion 1992, was hit on the L temporal area with LOC.    Vision changes? "Sometimes I see two of something, but eye checkup was negative".  Unilateral weakness, numbness or tingling? denies   Any tremors?  Any anosmia?  denies   Any incontinence of urine? denies   Any bowel dysfunction? denies      Patient lives with sister and a brother    History of heavy alcohol intake? 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 Now socially History of heavy tobacco use? denies   Family history of dementia? father has dementia unknown type.  Does patient drive? Yes          MRI brain 03/04/23 personally reviewed Negative for acute infarct. Mild chronic microvascular ischemic changes. Chronic L cerebellar infarcts noted .   EEG 03/29/2023  normal, no seizure activity   Labs B12 262. TSH 1.587  Past Medical History:  Diagnosis Date   Anxiety    Arthritis    general stiffness of joints   Asthma    Complication of anesthesia    per pt,"hard to wake up past some sedation!   Diabetes mellitus (HCC)    Diarrhea    Heart murmur    childhood years- no mention in adult yrs   History of indigestion    Hypertension    Morbid obesity (HCC)    Recurrent upper respiratory infection (URI)    Seasonal allergies    Spinal stenosis      Past Surgical History:  Procedure Laterality Date   COLONOSCOPY N/A 08/04/2015   Procedure: COLONOSCOPY;  Surgeon: Beverley Fiedler, MD;  Location: [facility];  Service: Gastroenterology;  Laterality: N/A;   COLONOSCOPY     COLONOSCOPY WITH PROPOFOL N/A 10/22/2018   Procedure: COLONOSCOPY WITH PROPOFOL;  Surgeon: Beverley Fiedler, MD;  Location: [facility];  Service: Gastroenterology;  Laterality: N/A;   OOPHORECTOMY  05-2000    REMOVED LEFT TUBE AND OVARY   POLYPECTOMY  10/22/2018   Procedure: POLYPECTOMY;  Surgeon: Beverley Fiedler, MD;  Location: [facility];  Service: Gastroenterology;;   WISDOM TOOTH EXTRACTION       PREVIOUS MEDICATIONS:   CURRENT MEDICATIONS:  Outpatient Encounter Medications as of 06/22/2023  Medication Sig   acetaminophen (TYLENOL) 500 MG tablet Take 1,000 mg by mouth 2 (two) times daily as needed for moderate pain.   albuterol (PROVENTIL) (2.5 MG/3ML)  0.083% nebulizer solution Take 3 mLs (2.5 mg total) by nebulization every 6 (six) hours as needed for wheezing or shortness of breath.   albuterol (VENTOLIN HFA) 108 (90 Base) MCG/ACT inhaler INHALE 2 PUFFS BY MOUTH EVERY 4 HOURS AS NEEDED FOR WHEEZING OR SHORTNESS OF BREATH (Patient taking differently: Inhale 2 puffs into the lungs every 4 (four) hours as needed for shortness of breath.)   ALPRAZolam (XANAX) 0.5 MG tablet Take 1 tablet (0.5 mg total) by mouth 3 (three) times daily as needed for sleep or anxiety.   BREZTRI AEROSPHERE 160-9-4.8 MCG/ACT AERO INHALE 1 PUFF INTO THE LUNGS ONCE DAILY   cetirizine (ZYRTEC) 10 MG tablet Take 10 mg by mouth daily.   Doxepin HCl 5 % CREA Apply 1 Application topically daily at 6 (six) AM.   escitalopram (LEXAPRO) 20 MG tablet Take 1 tablet by mouth once daily   FASENRA PEN 30 MG/ML SOAJ INJECT 30MG  SUBCUTANEOUSLY EVERY 8 WEEKS (Patient taking differently: Inject 30 mg into the skin every 8 (eight) weeks.)   furosemide (LASIX) 20 MG tablet Take 20 mg by mouth daily.   loperamide (IMODIUM A-D) 2 MG tablet Take 2 mg by mouth daily as needed for diarrhea or loose stools.   loratadine (CLARITIN) 10 MG tablet Take 10 mg by mouth daily.   losartan (COZAAR) 50 MG tablet Take 1 tablet (50 mg total) by mouth daily. (Patient taking differently: Take 25 mg by mouth daily.)   metFORMIN (GLUCOPHAGE) 1000 MG tablet Take 1 tablet (1,000 mg total) by mouth daily with breakfast.   MOUNJARO 5 MG/0.5ML Pen SMARTSIG:5  Milligram(s) SUB-Q Once a Week   omeprazole (PRILOSEC) 20 MG capsule Take 1 capsule (20 mg total) by mouth daily.   rosuvastatin (CRESTOR) 5 MG tablet Take 5 mg by mouth daily.   Spacer/Aero-Holding Chambers (AEROCHAMBER PLUS FLO-VU LARGE) MISC Use as directed with metered dose inhaler.   Tetrahydrozoline HCl (VISINE OP) Place 1 drop into both eyes daily as needed (dry eyes).   apixaban (ELIQUIS) 5 MG TABS tablet Take 2 tablets (10 mg total) by mouth 2 (two) times daily until 6/29, THEN on 6/30 take 1 tablet (5 mg total) by mouth 2 (two) times daily thereafter   Facility-Administered Encounter Medications as of 06/22/2023  Medication   Benralizumab SOSY 30 mg     Objective:     PHYSICAL EXAMINATION:    VITALS:   Vitals:   06/22/23 0859  BP: 138/60  Pulse: 73  Resp: 18  SpO2: 96%  Weight: (!) 312 lb (141.5 kg)  Height: 5\' 5"  (1.651 m)    GEN:  The patient appears stated age and is in NAD. HEENT:  Normocephalic, atraumatic.   Neurological examination:  General: NAD, well-groomed, appears stated age. Orientation: The patient is alert. Oriented to person, place and date Cranial nerves: There is good facial symmetry.The speech is fluent and clear. No aphasia or dysarthria. Fund of knowledge is appropriate. Recent  and remote memory is normal.  Attention and concentration are normal.  Able to name objects and repeat phrases.  Hearing is intact to conversational tone . Sensation: Sensation is intact to light touch throughout Motor: Strength is at least antigravity x4. DTR's 2/4 in UE/LE      03/22/2023    9:00 AM  Montreal Cognitive Assessment   Visuospatial/ Executive (0/5) 2  Naming (0/3) 3  Attention: Read list of digits (0/2) 2  Attention: Read list of letters (0/1) 1  Attention: Serial 7 subtraction starting at  100 (0/3) 3  Language: Repeat phrase (0/2) 2  Language : Fluency (0/1) 1  Abstraction (0/2) 1  Delayed Recall (0/5) 3  Orientation (0/6) 6  Total 24   Adjusted Score (based on education) 24       06/22/2023    9:00 AM  MMSE - Mini Mental State Exam  Orientation to time 5  Orientation to Place 5  Registration 3  Attention/ Calculation 5  Recall 3  Language- name 2 objects 2  Language- repeat 1  Language- follow 3 step command 3  Language- read & follow direction 1  Write a sentence 1  Copy design 1  Total score 30       Movement examination: Tone: There is normal tone in the UE/LE Abnormal movements:  no tremor.  No myoclonus.  No asterixis.   Coordination:  There is no decremation with RAM's. Normal finger to nose  Gait and Station: The patient has no difficulty arising out of a deep-seated chair without the use of the hands. The patient's stride length is good.  Gait is cautious and narrow.   Thank you for allowing [place] the opportunity to participate in the care of this nice patient. Please do not hesitate to contact us for any questions or concerns.   Total time spent on today's visit was 30 minutes dedicated to this patient today, preparing to see patient, examining the patient, ordering tests and/or medications and counseling the patient, documenting clinical information in the EHR or other health record, independently interpreting results and communicating results to the patient/family, discussing treatment and goals, answering patient's questions and coordinating care.  Cc:  Elie Confer, NP  Marlowe Kays 06/22/2023 9:31 AM

## 2023-07-05 ENCOUNTER — Ambulatory Visit (HOSPITAL_COMMUNITY)
Admission: RE | Admit: 2023-07-05 | Discharge: 2023-07-05 | Disposition: A | Discharge status: Home / Self Care | Payer: 59 | Source: Ambulatory Visit | Attending: Hematology | Admitting: Hematology

## 2023-07-05 ENCOUNTER — Inpatient Hospital Stay: Payer: 59 | Attending: Hematology

## 2023-07-05 DIAGNOSIS — I824Y2 Acute embolism and thrombosis of unspecified deep veins of left proximal lower extremity: Secondary | ICD-10-CM | POA: Insufficient documentation

## 2023-07-05 DIAGNOSIS — I82442 Acute embolism and thrombosis of left tibial vein: Secondary | ICD-10-CM | POA: Diagnosis present

## 2023-07-05 DIAGNOSIS — I82432 Acute embolism and thrombosis of left popliteal vein: Secondary | ICD-10-CM | POA: Insufficient documentation

## 2023-07-05 DIAGNOSIS — Z79899 Other long term (current) drug therapy: Secondary | ICD-10-CM | POA: Insufficient documentation

## 2023-07-05 DIAGNOSIS — I82412 Acute embolism and thrombosis of left femoral vein: Secondary | ICD-10-CM | POA: Diagnosis present

## 2023-07-05 LAB — D-DIMER, QUANTITATIVE: D-Dimer, Quant: 0.65 ug{FEU}/mL — ABNORMAL HIGH (ref 0.00–0.50)

## 2023-07-07 LAB — BETA-2-GLYCOPROTEIN I ABS, IGG/M/A
Beta-2 Glyco I IgG: <9 GPI IgG units (ref 0–20)
Beta-2-Glycoprotein I IgA: <9 GPI IgA units (ref 0–25)
Beta-2-Glycoprotein I IgM: <9 GPI IgM units (ref 0–32)

## 2023-07-07 LAB — CARDIOLIPIN ANTIBODIES, IGG, IGM, IGA
Anticardiolipin IgA: <9 U/mL (ref 0–11)
Anticardiolipin IgG: <9 [GPL'U]/mL (ref 0–14)
Anticardiolipin IgM: <9 [MPL'U]/mL (ref 0–12)

## 2023-07-07 LAB — LUPUS ANTICOAGULANT PANEL
DRVVT: 41 s (ref 0.0–47.0)
PTT Lupus Anticoagulant: 35.4 s (ref 0.0–43.5)

## 2023-07-13 ENCOUNTER — Other Ambulatory Visit: Payer: 59

## 2023-07-13 ENCOUNTER — Ambulatory Visit: Payer: 59 | Admitting: Oncology

## 2023-07-17 NOTE — Progress Notes (Unsigned)
[facility] [address] [place], [place] [address]   CLINIC:  Medical Oncology/Hematology  PCP:  Elie Confer, NP [address] [place] [address] [phone]   REASON FOR VISIT:  Follow-up for unprovoked LLE DVT  CURRENT THERAPY: Eliquis 5 mg twice daily  INTERVAL HISTORY:   Amy Molina 62 y.o. female returns for routine follow-up of her unprovoked LLE DVT.  She was last seen by Dr. Ellin Saba on 03/08/2023.  At today's visit, she reports feeling fair.  No recent hospitalizations, surgeries, or changes in baseline health status.  She continues to take Eliquis 5 mg twice daily, but admits to missing doses after her US showed resolution of blood clot.  She denies any adverse bleeding events since being placed on blood thinner.   She has chronic bilateral lower extremity edema/venous stasis, denies any new onset unilateral leg swelling.  She denies any chest pain or hemoptysis.  She has some shortness of breath related to her asthma and gets winded with exertion.  She has 70% energy and 90% appetite. She has lost 30-40 pounds in the past year while taking Mounjaro.  Weight at today's visit is stable compared to last visit.  ASSESSMENT & PLAN:  1.  Unprovoked left leg DVT: - Hospitalized 03/04/2023 through 03/08/2023 for acute unprovoked LLE DVT and superimposed left leg cellulitis  Presented with headaches, dizziness, difficulty concentrating, trouble word finding, left leg swelling/redness/tenderness Left leg venous US Doppler (03/04/2023): Positive DVT involving left femoral, popliteal, and posterior tibial veins. MRI brain without contrast (03/04/2023): Negative for acute infarct.  Chronic left cerebellar infarct. Cellulitis treated with IV cefazolin, discharged with Keflex. - No obvious provoking factors for DVT.  Chronic risk factors for DVT including obesity and venous stasis changes in lower legs. - No prior history of thrombosis.  She had miscarriage x  1 at age 57, in the first trimester. - She has been taking Eliquis since 03/05/2023.  Tolerating this well without adverse bleeding events.   - Repeat US venous LLE (07/05/2023): Interval resolution of left lower extremity deep venous thrombus - No symptoms of recurrent DVT or PE at this time.   - D-dimer (07/05/2023) mildly elevated, but improved at 0.65 - Negative for lupus anticoagulant, cardiolipin antibodies, and beta-2 glycoprotein 1 antibodies. - Due to high BMI of 49, apixaban anti-Xa trough levels were obtained on 03/14/2023), with levels of 73 ng/mL that were below the 5th-95th percentile range of 91-321 ng/mL.  (Adjusting dose of apixaban based on low antifactor Xa levels not routinely recommended, as there is no established correlation between these levels and the efficacy or safety of apixaban in standard clinical settings.) - PLAN: Due to unprovoked nature of DVT (and risk factors for recurrence including obesity and venous stasis changes in lower legs), recommend long-term anticoagulation. - Will continue Eliquis 5 mg twice daily.  We discussed relative risks versus benefits of ongoing anticoagulation.  Patient is agreeable to proceed. - RTC in 1 year with repeat CBC/D, CMP, D-dimer, and apixaban anti-Xa trough levels   2.  Other medical problems: - HTN, DM2, persistent asthma, GERD, morbid obesity  3.  Social/family history: - Her sister lives with her.  She is independent of ADLs and IADLs.  She works at [employer] on [employer], 12-hour shifts.  She is up and about on her feet during work.  She is a non-smoker. - No family history of DVT.  Father had kidney cancer.  Mother had breast  cancer.  2 maternal aunts had bilateral breast cancer.  Maternal great uncle had stomach cancer.  Another maternal aunt had brain tumor.  Paternal uncle had prostate cancer.  1 maternal aunt had 3 miscarriages.  PLAN SUMMARY: >> Labs in 1 year (appointment should be first thing in the  morning) = CBC/D, CMP, D-dimer, apixaban anti-Xa trough [Misc. [facility] #[id number]] >> OFFICE visit in 1 year (1 week after labs)     REVIEW OF SYSTEMS:   Review of Systems  Constitutional:  Positive for fatigue. Negative for appetite change, chills, diaphoresis, fever and unexpected weight change.  HENT:   Negative for lump/mass and nosebleeds.   Eyes:  Negative for eye problems.  Respiratory:  Positive for shortness of breath (with exertion, asthma). Negative for cough and hemoptysis.   Cardiovascular:  Negative for chest pain, leg swelling and palpitations.  Gastrointestinal:  Positive for diarrhea. Negative for abdominal pain, blood in stool, constipation, nausea and vomiting.  Genitourinary:  Negative for hematuria.   Skin: Negative.   Neurological:  Positive for numbness. Negative for dizziness, headaches and light-headedness.  Hematological:  Does not bruise/bleed easily.  Psychiatric/Behavioral:  Positive for depression. The patient is nervous/anxious.      PHYSICAL EXAM:  ECOG PERFORMANCE STATUS: 1 - Symptomatic but completely ambulatory  Vitals:   07/18/23 0943  BP: 121/60  Pulse: 69  Resp: 18  Temp: 98.1 F (36.7 C)  SpO2: 98%   Filed Weights   07/18/23 0943  Weight: (!) 308 lb 3.2 oz (139.8 kg)   Physical Exam Constitutional:      Appearance: Normal appearance. She is morbidly obese.  Cardiovascular:     Heart sounds: Normal heart sounds.  Pulmonary:     Breath sounds: Normal breath sounds.  Musculoskeletal:     Right lower leg: Edema (chronic lymphedema w/ venous stasis chnages) present.     Left lower leg: Edema (chronic lymphedema w/ venous stasis chnages) present.  Neurological:     General: No focal deficit present.     Mental Status: Mental status is at baseline.  Psychiatric:        Behavior: Behavior normal. Behavior is cooperative.     PAST MEDICAL/SURGICAL HISTORY:  Past Medical History:  Diagnosis Date   Anxiety    Arthritis    general  stiffness of joints   Asthma    Complication of anesthesia    per pt,"hard to wake up past some sedation!   Diabetes mellitus (HCC)    Diarrhea    Heart murmur    childhood years- no mention in adult yrs   History of indigestion    Hypertension    Morbid obesity (HCC)    Recurrent upper respiratory infection (URI)    Seasonal allergies    Spinal stenosis    Past Surgical History:  Procedure Laterality Date   COLONOSCOPY N/A 08/04/2015   Procedure: COLONOSCOPY;  Surgeon: Beverley Fiedler, MD;  Location: [facility];  Service: Gastroenterology;  Laterality: N/A;   COLONOSCOPY     COLONOSCOPY WITH PROPOFOL N/A 10/22/2018   Procedure: COLONOSCOPY WITH PROPOFOL;  Surgeon: Beverley Fiedler, MD;  Location: [facility];  Service: Gastroenterology;  Laterality: N/A;   OOPHORECTOMY  05-2000   REMOVED LEFT TUBE AND OVARY   POLYPECTOMY  10/22/2018   Procedure: POLYPECTOMY;  Surgeon: Beverley Fiedler, MD;  Location: [facility];  Service: Gastroenterology;;   WISDOM TOOTH EXTRACTION      SOCIAL HISTORY:  Social History   Socioeconomic History  Marital status: Single    Spouse name: Not on file   Number of children: 1   Years of education: 14   Highest education level: Not on file  Occupational History   Not on file  Tobacco Use   Smoking status: Never   Smokeless tobacco: Never  Vaping Use   Vaping status: Never Used  Substance and Sexual Activity   Alcohol use: Yes    Alcohol/week: 0.0 standard drinks of alcohol    Comment: RARELY   Drug use: No   Sexual activity: Not Currently    Birth control/protection: None  Other Topics Concern   Not on file  Social History Narrative   Right handed   Drinks caffeine   [employer] works there   Lives brotherin Social worker and sister   [place] home   Social Determinants of Health   Financial Resource Strain: Not on file  Food Insecurity: Not on file  Transportation Needs: Not on file  Physical Activity: Not on file  Stress: Not on  file  Social Connections: Unknown (01/21/2022)   [facility]   Social Network    Social Network: Not on file  Intimate Partner Violence: Unknown (12/13/2021)   Received from [facility], [facility]   HITS    Physically Hurt: Not on file    Insult or Talk Down To: Not on file    Threaten Physical Harm: Not on file    Scream or Curse: Not on file    FAMILY HISTORY:  Family History  Problem Relation Age of Onset   Eczema Mother    Heart disease Mother    Heart attack Mother    Breast cancer Mother    Hypertension Father    Heart disease Father    Colon polyps Father    COPD Father    Kidney cancer Father    Asthma Sister    Allergic rhinitis Sister    Diabetes Sister    Diabetes Maternal Aunt        7 mat aunts diabetes   Hypertension Maternal Aunt    Lung disease Maternal Aunt    Hypertension Paternal Aunt    Breast cancer Paternal Aunt    Colon cancer Neg Hx     CURRENT MEDICATIONS:  Outpatient Encounter Medications as of 07/18/2023  Medication Sig   MOUNJARO 2.5 MG/0.5ML Pen SMARTSIG:0.5 Milliliter(s) SUB-Q Once a Week   acetaminophen (TYLENOL) 500 MG tablet Take 1,000 mg by mouth 2 (two) times daily as needed for moderate pain.   albuterol (PROVENTIL) (2.5 MG/3ML) 0.083% nebulizer solution Take 3 mLs (2.5 mg total) by nebulization every 6 (six) hours as needed for wheezing or shortness of breath.   albuterol (VENTOLIN HFA) 108 (90 Base) MCG/ACT inhaler INHALE 2 PUFFS BY MOUTH EVERY 4 HOURS AS NEEDED FOR WHEEZING OR SHORTNESS OF BREATH (Patient taking differently: Inhale 2 puffs into the lungs every 4 (four) hours as needed for shortness of breath.)   ALPRAZolam (XANAX) 0.5 MG tablet Take 1 tablet (0.5 mg total) by mouth 3 (three) times daily as needed for sleep or anxiety.   apixaban (ELIQUIS) 5 MG TABS tablet Take 2 tablets (10 mg total) by mouth 2 (two) times daily until 6/29, THEN on 6/30 take 1 tablet (5 mg total) by mouth 2 (two)  times daily thereafter   BREZTRI AEROSPHERE 160-9-4.8 MCG/ACT AERO INHALE 1 PUFF INTO THE LUNGS ONCE DAILY   cetirizine (ZYRTEC) 10 MG tablet Take 10 mg  by mouth daily.   Doxepin HCl 5 % CREA Apply 1 Application topically daily at 6 (six) AM.   escitalopram (LEXAPRO) 20 MG tablet Take 1 tablet by mouth once daily   FASENRA PEN 30 MG/ML SOAJ INJECT 30MG  SUBCUTANEOUSLY EVERY 8 WEEKS (Patient taking differently: Inject 30 mg into the skin every 8 (eight) weeks.)   furosemide (LASIX) 20 MG tablet Take 20 mg by mouth daily.   loperamide (IMODIUM A-D) 2 MG tablet Take 2 mg by mouth daily as needed for diarrhea or loose stools.   loratadine (CLARITIN) 10 MG tablet Take 10 mg by mouth daily.   losartan (COZAAR) 50 MG tablet Take 1 tablet (50 mg total) by mouth daily. (Patient taking differently: Take 25 mg by mouth daily.)   metFORMIN (GLUCOPHAGE) 1000 MG tablet Take 1 tablet (1,000 mg total) by mouth daily with breakfast.   omeprazole (PRILOSEC) 20 MG capsule Take 1 capsule (20 mg total) by mouth daily.   rosuvastatin (CRESTOR) 5 MG tablet Take 5 mg by mouth daily.   Spacer/Aero-Holding Chambers (AEROCHAMBER PLUS FLO-VU LARGE) MISC Use as directed with metered dose inhaler.   Tetrahydrozoline HCl (VISINE OP) Place 1 drop into both eyes daily as needed (dry eyes).   [DISCONTINUED] MOUNJARO 5 MG/0.5ML Pen SMARTSIG:5 Milligram(s) SUB-Q Once a Week   Facility-Administered Encounter Medications as of 07/18/2023  Medication   Benralizumab SOSY 30 mg    ALLERGIES:  Allergies  Allergen Reactions   Penicillins Anaphylaxis, Shortness Of Breath and Swelling    DID THE REACTION INVOLVE: Swelling of the face/tongue/throat, SOB, or low BP? Yes Sudden or severe rash/hives, skin peeling, or the inside of the mouth or nose? No Did it require medical treatment? Yes When did it last happen?      15+ years If all above answers are "NO", may proceed with cephalosporin use.  PATIENT TOLERATED ZOSYN AND MERREM ON  07/21/17 ADMISSION    Floxin [Ofloxacin]     MIGRAINES, INSOMNIA, PARANOIND, RINGING IN EARS   Sulfa Antibiotics Hives   Lipitor [Atorvastatin]     Severe myalgias   Penicillin G Benzathine Rash    LABORATORY DATA:  I have reviewed the labs as listed.  CBC    Component Value Date/Time   WBC 13.3 (H) 03/08/2023 0315   RBC 4.47 03/08/2023 0315   HGB 13.7 03/08/2023 0315   HGB 14.0 08/09/2021 1318   HCT 42.2 03/08/2023 0315   HCT 41.8 08/09/2021 1318   PLT 288 03/08/2023 0315   MCV 94.4 03/08/2023 0315   MCV 92 08/09/2021 1318   MCH 30.6 03/08/2023 0315   MCHC 32.5 03/08/2023 0315   RDW 13.4 03/08/2023 0315   RDW 11.7 08/09/2021 1318   LYMPHSABS 2.7 03/04/2023 1135   LYMPHSABS 3.3 (H) 08/09/2021 1318   MONOABS 1.7 (H) 03/04/2023 1135   EOSABS 0.0 03/04/2023 1135   EOSABS 0.6 (H) 08/09/2021 1318   BASOSABS 0.1 03/04/2023 1135   BASOSABS 0.1 08/09/2021 1318      Latest Ref Rng & Units 03/06/2023    4:38 AM 03/05/2023    4:56 AM 03/04/2023   11:35 AM  CMP  Glucose 70 - 99 mg/dL 93  578  469   BUN 6 - 20 mg/dL 9  11  13    Creatinine 0.44 - 1.00 mg/dL 6.29  5.28  4.13   Sodium 135 - 145 mmol/L 137  135  134   Potassium 3.5 - 5.1 mmol/L 4.6  2.9  3.1   Chloride  98 - 111 mmol/L 105  103  99   CO2 22 - 32 mmol/L 25  24  23    Calcium 8.9 - 10.3 mg/dL 8.2  7.7  8.0   Total Protein 6.5 - 8.1 g/dL   7.3   Total Bilirubin 0.3 - 1.2 mg/dL   1.8   Alkaline Phos 38 - 126 U/L   65   AST 15 - 41 U/L   30   ALT 0 - 44 U/L   17     DIAGNOSTIC IMAGING:  I have independently reviewed the relevant imaging and discussed with the patient.   WRAP UP:  All questions were answered. The patient knows to call the clinic with any problems, questions or concerns.  Medical decision making: Moderate  Time spent on visit: I spent 20 minutes counseling the patient face to face. The total time spent in the appointment was 30 minutes and more than 50% was on counseling.  Carnella Guadalajara,  PA-C  07/18/23 10:17 AM

## 2023-07-18 ENCOUNTER — Inpatient Hospital Stay: Payer: 59 | Attending: Hematology | Admitting: Physician Assistant

## 2023-07-18 VITALS — BP 121/60 | HR 69 | Temp 98.1°F | Resp 18 | Ht 65.0 in | Wt 308.2 lb

## 2023-07-18 DIAGNOSIS — Z7985 Long-term (current) use of injectable non-insulin antidiabetic drugs: Secondary | ICD-10-CM | POA: Insufficient documentation

## 2023-07-18 DIAGNOSIS — Z882 Allergy status to sulfonamides status: Secondary | ICD-10-CM | POA: Insufficient documentation

## 2023-07-18 DIAGNOSIS — Z7901 Long term (current) use of anticoagulants: Secondary | ICD-10-CM | POA: Diagnosis not present

## 2023-07-18 DIAGNOSIS — Z840 Family history of diseases of the skin and subcutaneous tissue: Secondary | ICD-10-CM | POA: Insufficient documentation

## 2023-07-18 DIAGNOSIS — J45909 Unspecified asthma, uncomplicated: Secondary | ICD-10-CM | POA: Diagnosis not present

## 2023-07-18 DIAGNOSIS — Z90721 Acquired absence of ovaries, unilateral: Secondary | ICD-10-CM | POA: Diagnosis not present

## 2023-07-18 DIAGNOSIS — I824Y2 Acute embolism and thrombosis of unspecified deep veins of left proximal lower extremity: Secondary | ICD-10-CM

## 2023-07-18 DIAGNOSIS — Z6841 Body Mass Index (BMI) 40.0 and over, adult: Secondary | ICD-10-CM | POA: Insufficient documentation

## 2023-07-18 DIAGNOSIS — Z880 Allergy status to penicillin: Secondary | ICD-10-CM | POA: Insufficient documentation

## 2023-07-18 DIAGNOSIS — I82412 Acute embolism and thrombosis of left femoral vein: Secondary | ICD-10-CM | POA: Insufficient documentation

## 2023-07-18 DIAGNOSIS — R5383 Other fatigue: Secondary | ICD-10-CM | POA: Insufficient documentation

## 2023-07-18 DIAGNOSIS — F419 Anxiety disorder, unspecified: Secondary | ICD-10-CM | POA: Insufficient documentation

## 2023-07-18 DIAGNOSIS — Z8673 Personal history of transient ischemic attack (TIA), and cerebral infarction without residual deficits: Secondary | ICD-10-CM | POA: Diagnosis not present

## 2023-07-18 DIAGNOSIS — R42 Dizziness and giddiness: Secondary | ICD-10-CM | POA: Insufficient documentation

## 2023-07-18 DIAGNOSIS — R519 Headache, unspecified: Secondary | ICD-10-CM | POA: Insufficient documentation

## 2023-07-18 DIAGNOSIS — Z881 Allergy status to other antibiotic agents status: Secondary | ICD-10-CM | POA: Insufficient documentation

## 2023-07-18 DIAGNOSIS — K219 Gastro-esophageal reflux disease without esophagitis: Secondary | ICD-10-CM | POA: Diagnosis not present

## 2023-07-18 DIAGNOSIS — Z803 Family history of malignant neoplasm of breast: Secondary | ICD-10-CM | POA: Diagnosis not present

## 2023-07-18 DIAGNOSIS — Z79899 Other long term (current) drug therapy: Secondary | ICD-10-CM | POA: Insufficient documentation

## 2023-07-18 DIAGNOSIS — I82432 Acute embolism and thrombosis of left popliteal vein: Secondary | ICD-10-CM | POA: Diagnosis present

## 2023-07-18 DIAGNOSIS — I82442 Acute embolism and thrombosis of left tibial vein: Secondary | ICD-10-CM | POA: Diagnosis present

## 2023-07-18 DIAGNOSIS — R0602 Shortness of breath: Secondary | ICD-10-CM | POA: Diagnosis not present

## 2023-07-18 DIAGNOSIS — Z8042 Family history of malignant neoplasm of prostate: Secondary | ICD-10-CM | POA: Insufficient documentation

## 2023-07-18 DIAGNOSIS — I10 Essential (primary) hypertension: Secondary | ICD-10-CM | POA: Insufficient documentation

## 2023-07-18 DIAGNOSIS — Z8249 Family history of ischemic heart disease and other diseases of the circulatory system: Secondary | ICD-10-CM | POA: Insufficient documentation

## 2023-07-18 DIAGNOSIS — Z833 Family history of diabetes mellitus: Secondary | ICD-10-CM | POA: Insufficient documentation

## 2023-07-18 DIAGNOSIS — E119 Type 2 diabetes mellitus without complications: Secondary | ICD-10-CM | POA: Diagnosis not present

## 2023-07-18 DIAGNOSIS — R197 Diarrhea, unspecified: Secondary | ICD-10-CM | POA: Insufficient documentation

## 2023-07-18 DIAGNOSIS — Z83719 Family history of colon polyps, unspecified: Secondary | ICD-10-CM | POA: Insufficient documentation

## 2023-07-18 DIAGNOSIS — Z825 Family history of asthma and other chronic lower respiratory diseases: Secondary | ICD-10-CM | POA: Insufficient documentation

## 2023-07-18 DIAGNOSIS — Z8051 Family history of malignant neoplasm of kidney: Secondary | ICD-10-CM | POA: Insufficient documentation

## 2023-07-18 MED ORDER — APIXABAN 5 MG PO TABS: 5.0000 mg | ORAL_TABLET | Freq: Two times a day (BID) | ORAL | 11 refills | Status: DC

## 2023-07-18 NOTE — Addendum Note (Signed)
Addended by: Rojelio Brenner on: 07/18/2023 01:19 PM   Modules accepted: Orders

## 2023-07-18 NOTE — Patient Instructions (Addendum)
[facility] at [facility] **VISIT SUMMARY & IMPORTANT INSTRUCTIONS **   You were seen today by Rojelio Brenner PA-C for your left leg blood clot (DVT - a.k.a. deep vein thrombosis).   Your most recent left leg ultrasound shows that the blood clot has completely resolved! However, you remain at risk for future blood clots, so I recommend continuing Eliquis 5 mg twice daily to help prevent future blood clots. We will recheck your labs and see you for an office visit again in 1 year. Lab appointment will be first thing in the morning, so that your labs can be checked BEFORE your Eliquis dose that day. Please see the attached handout for important information regarding symptoms of another blood clot in your leg. Please see the attached handout for important information regarding bleeding precautions while taking blood thinner.  FOLLOW-UP APPOINTMENT: 1 year  ** Thank you for trusting me with your healthcare!  I strive to provide all of my patients with quality care at each visit.  If you receive a survey for this visit, I would be so grateful to you for taking the time to provide feedback.  Thank you in advance!  ~ Jennell Janosik                   Dr. Doreatha Massed   &   Rojelio Brenner, PA-C   - - - - - - - - - - - - - - - - - -    Thank you for choosing [facility] at [facility] to provide your oncology and hematology care.  To afford each patient quality time with our provider, please arrive at least 15 minutes before your scheduled appointment time.   If you have a lab appointment with the Cancer Center please come in thru the Main Entrance and check in at the main information desk.  You need to re-schedule your appointment should you arrive 10 or more minutes late.  We strive to give you quality time with our providers, and arriving late affects you and other patients whose appointments are after yours.  Also, if you no show three or more  times for appointments you may be dismissed from the clinic at the providers discretion.     Again, thank you for choosing [facility].  Our hope is that these requests will decrease the amount of time that you wait before being seen by our physicians.       _____________________________________________________________  Should you have questions after your visit to [facility], please contact our office at [phone] and follow the prompts.  Our office hours are 8:00 a.m. and 4:30 p.m. Monday - Friday.  Please note that voicemails left after 4:00 p.m. may not be returned until the following business day.  We are closed weekends and major holidays.  You do have access to a nurse 24-7, just call the main number to the clinic [phone] and do not press any options, hold on the line and a nurse will answer the phone.    For prescription refill requests, have your pharmacy contact our office and allow 72 hours.

## 2023-07-28 ENCOUNTER — Ambulatory Visit: Payer: 59 | Admitting: Family Medicine

## 2023-07-28 ENCOUNTER — Encounter: Payer: Self-pay | Admitting: Family Medicine

## 2023-07-28 VITALS — BP 150/86 | HR 93 | Temp 99.1°F | Wt 316.0 lb

## 2023-07-28 DIAGNOSIS — Z86718 Personal history of other venous thrombosis and embolism: Secondary | ICD-10-CM | POA: Insufficient documentation

## 2023-07-28 DIAGNOSIS — J302 Other seasonal allergic rhinitis: Secondary | ICD-10-CM | POA: Diagnosis not present

## 2023-07-28 DIAGNOSIS — J4540 Moderate persistent asthma, uncomplicated: Secondary | ICD-10-CM | POA: Diagnosis not present

## 2023-07-28 DIAGNOSIS — J3089 Other allergic rhinitis: Secondary | ICD-10-CM | POA: Diagnosis not present

## 2023-07-28 MED ORDER — PREDNISONE 10 MG PO TABS: ORAL_TABLET | ORAL | 0 refills | Status: DC

## 2023-07-28 MED ORDER — METHYLPREDNISOLONE ACETATE 80 MG/ML IJ SUSP
80.0000 mg | Freq: Once | INTRAMUSCULAR | Status: AC
  Administered 2023-07-28: 80 mg via INTRAMUSCULAR

## 2023-07-28 NOTE — Progress Notes (Addendum)
[address] [place] [address] Dept: [phone]  FOLLOW UP NOTE  Patient ID: Amy Molina, female    DOB: [birth year]  Age: 62 y.o. MRN: [id number] Date of Office Visit: 07/28/2023  Assessment  Chief Complaint: Asthma (Last couple of weeks has had a hard time catching her breath. After neb treatment it helps but does not help for long. )  HPI Amy Molina is a 62 year old female who presents to the clinic for follow-up visit.  She was last seen in this clinic on 04/05/2023 by Dr. Dellis Anes for evaluation of asthma and allergic rhinitis.  She has a history of venous stasis for which she follows up with Dr. Darletta Moll.    At today's visit, she reports her asthma has been poorly controlled over the last several weeks with symptoms including shortness of breath with activity and occasional shortness of breath with rest and occasional wheezing with activity.  She denies cough with activity or rest.  About 1 week ago, she increased her Breztri inhaler from 2 puffs once a day to 2 puffs twice a day and began to use albuterol at least daily for relief of symptoms.  She reports albuterol provides short term relief of symptoms.  She continues to receive Fasenra injections, however, her last injection was on 03/13/2023.  She denies large or local reactions with Fasenra injections.  She reports a significant decrease in her symptoms of asthma while continuing on Fasenra injections.  Allergic rhinitis is reported as moderately well-controlled with nasal congestion beginning last night with thick green nasal drainage for 1 night.  She denies rhinorrhea, nasal congestion, or postnasal drainage.  She reports that she restarted cetirizine about 1 week ago and is not currently using Flonase or nasal saline rinses. Her last environmental allergy skin testing on 04/05/2023 was positive to grass pollen, tree pollen, mold, dust mite, cat, dog, and cockroach.  Chart review indicates  hospitalization on 03/04/2023 due to cellulitis and DVT.  At today's visit, she denies symptoms including extremity swelling, extremity redness, or extremity pain.  She continues Eliquis 5 mg twice a day.  She continues to follow-up with oncology for management of Eliquis and DVT.  Her current medications are listed in the chart.  Drug Allergies:  Allergies  Allergen Reactions   Penicillins Anaphylaxis, Shortness Of Breath and Swelling    DID THE REACTION INVOLVE: Swelling of the face/tongue/throat, SOB, or low BP? Yes Sudden or severe rash/hives, skin peeling, or the inside of the mouth or nose? No Did it require medical treatment? Yes When did it last happen?      15+ years If all above answers are "NO", may proceed with cephalosporin use.  PATIENT TOLERATED ZOSYN AND MERREM ON 07/21/17 ADMISSION    Floxin [Ofloxacin]     MIGRAINES, INSOMNIA, PARANOIND, RINGING IN EARS   Sulfa Antibiotics Hives   Lipitor [Atorvastatin]     Severe myalgias   Penicillin G Benzathine Rash    Physical Exam: BP (!) 150/86   Pulse 93   Temp 99.1 F (37.3 C)   Wt (!) 316 lb (143.3 kg)   LMP 08/09/2016   SpO2 93%   BMI 52.59 kg/m    Physical Exam Vitals reviewed.  Constitutional:      Appearance: Normal appearance.  HENT:     Head: Normocephalic and atraumatic.     Right Ear: Tympanic membrane normal.     Left Ear: Tympanic membrane normal.     Nose:  Comments: Slightly erythematous with thin clear nasal drainage noted.  Pharynx normal.  Ears normal.  Eyes normal.    Mouth/Throat:     Pharynx: Oropharynx is clear.  Eyes:     Conjunctiva/sclera: Conjunctivae normal.  Cardiovascular:     Rate and Rhythm: Normal rate and regular rhythm.     Heart sounds: Normal heart sounds. No murmur heard. Pulmonary:     Comments: Patient tachypneic with activity.  Lung sounds clear to auscultation.  Patient with regular respiratory rate while seated. Musculoskeletal:        General: Normal range of  motion.     Cervical back: Normal range of motion and neck supple.     Comments: No extremity swelling, redness, or heat noted  Skin:    General: Skin is warm and dry.  Neurological:     Mental Status: She is alert and oriented to person, place, and time.  Psychiatric:        Mood and Affect: Mood normal.        Behavior: Behavior normal.        Thought Content: Thought content normal.        Judgment: Judgment normal.     Diagnostics: Spirometry deferred to tachypnea.  Will get spirometry at follow-up visit  Assessment and Plan: 1. Moderate persistent asthma, uncomplicated   2. Seasonal and perennial allergic rhinitis   3. History of DVT (deep vein thrombosis)     Meds ordered this encounter  Medications   methylPREDNISolone acetate (DEPO-MEDROL) injection 80 mg   predniSONE (DELTASONE) 10 MG tablet    Sig: Start tomorrow 07/29/2023  Take 2 tablets twice a day for 3 days, then take 2 tablets once a day for 1 day, then take 1 tablet on the 5th day, then stop    Dispense:  15 tablet    Refill:  0    Patient Instructions  Asthma DeopMedrol given in the clinic. Begin prednisone 10 mg tablets. Take 2 tablets twice a day for 3 days, then take 2 tablets once a day for 1 day, then take 1 tablet on the 5th day, then stop  Continue Breztri-2 puffs twice a day with a spacer to prevent cough or wheeze continue albuterol 2 puffs once every 4 hours if needed for cough or wheeze You may use albuterol 2 puffs 5 to 15 minutes before activity to decrease cough or wheeze Restart Fasenra injections once  every 8 weeks for asthma control  Allergic rhinitis Continue allergen avoidance measures directed toward grass pollen, tree pollen, mold, dust mite, cat, dog, and cockroach as listed below Continue cetirizine 10 mg once a day as needed for runny nose or itch Consider saline nasal rinses as needed for nasal symptoms. Use this before any medicated nasal sprays for best result Consider allergen  immunotherapy if your symptoms are not well-controlled with the treatment plan as listed above  History of DVT Continue Eliquis as prescribed Continue follow-up with your oncologist as recommended  Call the clinic if this treatment plan is not working well for you.  Follow up in 1 month or sooner if needed.   Return in about 4 weeks (around 08/25/2023), or if symptoms worsen or fail to improve.    Thank you for the opportunity to care for this patient.  Please do not hesitate to contact me with questions.  Thermon Leyland, FNP [facility]

## 2023-07-28 NOTE — Patient Instructions (Addendum)
Asthma DeopMedrol given in the clinic. Begin prednisone 10 mg tablets. Take 2 tablets twice a day for 3 days, then take 2 tablets once a day for 1 day, then take 1 tablet on the 5th day, then stop  Continue Breztri-2 puffs twice a day with a spacer to prevent cough or wheeze continue albuterol 2 puffs once every 4 hours if needed for cough or wheeze You may use albuterol 2 puffs 5 to 15 minutes before activity to decrease cough or wheeze Restart Fasenra injections once  every 8 weeks for asthma control  Allergic rhinitis Continue allergen avoidance measures directed toward grass pollen, tree pollen, mold, dust mite, cat, dog, and cockroach as listed below Continue cetirizine 10 mg once a day as needed for runny nose or itch Consider saline nasal rinses as needed for nasal symptoms. Use this before any medicated nasal sprays for best result Consider allergen immunotherapy if your symptoms are not well-controlled with the treatment plan as listed above  History of DVT Continue Eliquis as prescribed Continue follow-up with your oncologist as recommended  Call the clinic if this treatment plan is not working well for you.  Follow up in 1 month or sooner if needed.  Reducing Pollen Exposure The American Academy of Allergy, Asthma and Immunology suggests the following steps to reduce your exposure to pollen during allergy seasons. Do not hang sheets or clothing out to dry; pollen may collect on these items. Do not mow lawns or spend time around freshly cut grass; mowing stirs up pollen. Keep windows closed at night.  Keep car windows closed while driving. Minimize morning activities outdoors, a time when pollen counts are usually at their highest. Stay indoors as much as possible when pollen counts or humidity is high and on windy days when pollen tends to remain in the air longer. Use air conditioning when possible.  Many air conditioners have filters that trap the pollen spores. Use a HEPA  room air filter to remove pollen form the indoor air you breathe.  Control of Mold Allergen Mold and fungi can grow on a variety of surfaces provided certain temperature and moisture conditions exist.  Outdoor molds grow on plants, decaying vegetation and soil.  The major outdoor mold, Alternaria and Cladosporium, are found in very high numbers during hot and dry conditions.  Generally, a late Summer - Fall peak is seen for common outdoor fungal spores.  Rain will temporarily lower outdoor mold spore count, but counts rise rapidly when the rainy period ends.  The most important indoor molds are Aspergillus and Penicillium.  Dark, humid and poorly ventilated basements are ideal sites for mold growth.  The next most common sites of mold growth are the bathroom and the kitchen.  Outdoor [employer] Use air conditioning and keep windows closed Avoid exposure to decaying vegetation. Avoid leaf raking. Avoid grain handling. Consider wearing a face mask if working in moldy areas.  Indoor Mold Control Maintain humidity below 50%. Clean washable surfaces with 5% bleach solution. Remove sources e.g. Contaminated carpets.   Control of Dust Mite Allergen Dust mites play a major role in allergic asthma and rhinitis. They occur in environments with high humidity wherever human skin is found. Dust mites absorb humidity from the atmosphere (ie, they do not drink) and feed on organic matter (including shed human and animal skin). Dust mites are a microscopic type of insect that you cannot see with the naked eye. High levels of dust mites have been detected from mattresses,  pillows, carpets, upholstered furniture, bed covers, clothes, soft toys and any woven material. The principal allergen of the dust mite is found in its feces. A gram of dust may contain 1,000 mites and 250,000 fecal particles. Mite antigen is easily measured in the air during house cleaning activities. Dust mites do not bite and do not cause  harm to humans, other than by triggering allergies/asthma.  Ways to decrease your exposure to dust mites in your home:  1. Encase mattresses, box springs and pillows with a mite-impermeable barrier or cover  2. Wash sheets, blankets and drapes weekly in hot water (130 F) with detergent and dry them in a dryer on the hot setting.  3. Have the room cleaned frequently with a vacuum cleaner and a damp dust-mop. For carpeting or rugs, vacuuming with a vacuum cleaner equipped with a high-efficiency particulate air (HEPA) filter. The dust mite allergic individual should not be in a room which is being cleaned and should wait 1 hour after cleaning before going into the room.  4. Do not sleep on upholstered furniture (eg, couches).  5. If possible removing carpeting, upholstered furniture and drapery from the home is ideal. Horizontal blinds should be eliminated in the rooms where the person spends the most time (bedroom, study, television room). Washable vinyl, roller-type shades are optimal.  6. Remove all non-washable stuffed toys from the bedroom. Wash stuffed toys weekly like sheets and blankets above.  7. Reduce indoor humidity to less than 50%. Inexpensive humidity monitors can be purchased at most hardware stores. Do not use a humidifier as can make the problem worse and are not recommended.  Control of Dog or Cat Allergen Avoidance is the best way to manage a dog or cat allergy. If you have a dog or cat and are allergic to dog or cats, consider removing the dog or cat from the home. If you have a dog or cat but don't want to find it a new home, or if your family wants a pet even though someone in the household is allergic, here are some strategies that may help keep symptoms at bay:  Keep the pet out of your bedroom and restrict it to only a few rooms. Be advised that keeping the dog or cat in only one room will not limit the allergens to that room. Don't pet, hug or kiss the dog or cat; if  you do, wash your hands with soap and water. High-efficiency particulate air (HEPA) cleaners run continuously in a bedroom or living room can reduce allergen levels over time. Regular use of a high-efficiency vacuum cleaner or a central vacuum can reduce allergen levels. Giving your dog or cat a bath at least once a week can reduce airborne allergen.  Control of Cockroach Allergen Cockroach allergen has been identified as an important cause of acute attacks of asthma, especially in urban settings.  There are fifty-five species of cockroach that exist in the [place], however only three, the [place], [place] species produce allergen that can affect patients with Asthma.  Allergens can be obtained from fecal particles, egg casings and secretions from cockroaches.    Remove food sources. Reduce access to water. Seal access and entry points. Spray runways with 0.5-1% Diazinon or Chlorpyrifos Blow boric acid power under stoves and refrigerator. Place bait stations (hydramethylnon) at feeding sites.

## 2023-07-31 ENCOUNTER — Telehealth: Payer: Self-pay | Admitting: *Deleted

## 2023-07-31 NOTE — Telephone Encounter (Signed)
I called and spoke with the patient. She states that she hasn't been out of the house today. She states that she has been really congested with green mucous and is going to start Mucinex. She has been in touch with Tammy and is scheduled to start on December 2nd.

## 2023-07-31 NOTE — Telephone Encounter (Signed)
-----   Message from Alfonse Spruce sent at 07/28/2023  9:31 PM EST ----- Can someone call and make sure that she is doing better on Monday? Thx!

## 2023-08-03 NOTE — Telephone Encounter (Signed)
Does she need a round of antibiotics sent in?

## 2023-08-03 NOTE — Telephone Encounter (Signed)
Called patient - DOB/NO DPR on file - unable to leave message - per recording, the voicemail is full and can not leave a message, please try your call again later.  If patient call back - please advise of below provider notation.

## 2023-08-14 ENCOUNTER — Ambulatory Visit: Payer: 59

## 2023-08-14 DIAGNOSIS — J4540 Moderate persistent asthma, uncomplicated: Secondary | ICD-10-CM | POA: Diagnosis not present

## 2023-09-11 ENCOUNTER — Ambulatory Visit (INDEPENDENT_AMBULATORY_CARE_PROVIDER_SITE_OTHER): Payer: 59

## 2023-09-11 DIAGNOSIS — J4540 Moderate persistent asthma, uncomplicated: Secondary | ICD-10-CM

## 2023-09-11 DIAGNOSIS — J4550 Severe persistent asthma, uncomplicated: Secondary | ICD-10-CM | POA: Diagnosis not present

## 2023-09-26 NOTE — Progress Notes (Deleted)
   [address] [place] [address] Dept: [phone]  FOLLOW UP NOTE  Patient ID: Amy Molina, female    DOB: [birth year]  Age: 63 y.o. MRN: [id number] Date of Office Visit: 09/27/2023  Assessment  Chief Complaint: No chief complaint on file.  HPI Amy Molina is a 63 year old female who presents to the clinic for follow-up visit.  She was last seen in this clinic on 07/28/2023.  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  Her last environmental allergy skin testing on 04/05/2023 was positive to grass pollen, tree pollen, mold, dust mite, cat, dog, and cockroach. Discussed the use of AI scribe software for clinical note transcription with the patient, who gave verbal consent to proceed.  History of Present Illness             Drug Allergies:  Allergies  Allergen Reactions   Penicillins Anaphylaxis, Shortness Of Breath and Swelling    DID THE REACTION INVOLVE: Swelling of the face/tongue/throat, SOB, or low BP? Yes Sudden or severe rash/hives, skin peeling, or the inside of the mouth or nose? No Did it require medical treatment? Yes When did it last happen?      15+ years If all above answers are "NO", may proceed with cephalosporin use.  PATIENT TOLERATED ZOSYN  AND MERREM  ON 07/21/17 ADMISSION    Floxin [Ofloxacin]     MIGRAINES, INSOMNIA, PARANOIND, RINGING IN EARS   Sulfa  Antibiotics Hives   Lipitor [Atorvastatin]     Severe myalgias   Penicillin G Benzathine Rash    Physical Exam: LMP 08/09/2016    Physical Exam  Diagnostics:    Assessment and Plan: No diagnosis found.  No orders of the defined types were placed in this encounter.   There are no Patient Instructions on file for this visit.  No follow-ups on file.    Thank you for the opportunity to care for this patient.  Please  do not hesitate to contact me with questions.  Marinus Sic, FNP [facility]

## 2023-09-27 ENCOUNTER — Ambulatory Visit: Payer: 59 | Admitting: Family Medicine

## 2023-09-27 DIAGNOSIS — J309 Allergic rhinitis, unspecified: Secondary | ICD-10-CM

## 2023-10-06 ENCOUNTER — Ambulatory Visit: Payer: 59 | Admitting: Family Medicine

## 2023-10-09 ENCOUNTER — Ambulatory Visit (INDEPENDENT_AMBULATORY_CARE_PROVIDER_SITE_OTHER): Payer: 59

## 2023-10-09 DIAGNOSIS — J4550 Severe persistent asthma, uncomplicated: Secondary | ICD-10-CM

## 2023-11-01 ENCOUNTER — Ambulatory Visit: Payer: 59 | Admitting: Family Medicine

## 2023-11-01 ENCOUNTER — Other Ambulatory Visit: Payer: Self-pay | Admitting: Family

## 2023-11-06 ENCOUNTER — Other Ambulatory Visit: Payer: Self-pay

## 2023-11-06 ENCOUNTER — Encounter: Payer: Self-pay | Admitting: Internal Medicine

## 2023-11-06 ENCOUNTER — Ambulatory Visit: Payer: 59 | Admitting: Internal Medicine

## 2023-11-06 VITALS — BP 138/72 | HR 88 | Temp 98.9°F | Resp 16 | Ht 65.0 in | Wt 309.6 lb

## 2023-11-06 DIAGNOSIS — J4540 Moderate persistent asthma, uncomplicated: Secondary | ICD-10-CM

## 2023-11-06 DIAGNOSIS — J3089 Other allergic rhinitis: Secondary | ICD-10-CM | POA: Diagnosis not present

## 2023-11-06 DIAGNOSIS — J302 Other seasonal allergic rhinitis: Secondary | ICD-10-CM | POA: Diagnosis not present

## 2023-11-06 DIAGNOSIS — L723 Sebaceous cyst: Secondary | ICD-10-CM | POA: Insufficient documentation

## 2023-11-06 MED ORDER — ALBUTEROL SULFATE (2.5 MG/3ML) 0.083% IN NEBU: 2.5000 mg | INHALATION_SOLUTION | Freq: Four times a day (QID) | RESPIRATORY_TRACT | 1 refills | Status: AC | PRN

## 2023-11-06 MED ORDER — BREZTRI AEROSPHERE 160-9-4.8 MCG/ACT IN AERO: 2.0000 | INHALATION_SPRAY | Freq: Two times a day (BID) | RESPIRATORY_TRACT | 1 refills | Status: DC

## 2023-11-06 MED ORDER — ALBUTEROL SULFATE HFA 108 (90 BASE) MCG/ACT IN AERS: 2.0000 | INHALATION_SPRAY | RESPIRATORY_TRACT | 1 refills | Status: DC | PRN

## 2023-11-06 MED ORDER — CETIRIZINE HCL 10 MG PO TABS: 10.0000 mg | ORAL_TABLET | Freq: Every day | ORAL | 5 refills | Status: AC

## 2023-11-06 NOTE — Patient Instructions (Addendum)
 Moderate Persistent Asthma - Maintenance inhaler: Continue Fasenra 30mg  subcutaneous injection every 8 weeks. Continue Breztri 160-9-4.22mcg 2 puffs twice a day with a spacer - Rescue inhaler: Albuterol 2 puffs via spacer or 1 vial via nebulizer every 4-6 hours as needed for respiratory symptoms of cough, shortness of breath, or wheezing Asthma control goals:  Full participation in all desired activities (may need albuterol before activity) Albuterol use two times or less a week on average (not counting use with activity) Cough interfering with sleep two times or less a month Oral steroids no more than once a year No hospitalizations  Allergic Rhinitis - SPT 05/2021: positive to grass pollen, tree pollen, mold, dust mite, cat, dog, and cockroach  - Use nasal saline rinses before nose sprays such as with Neilmed Sinus Rinse.  Use distilled water.   - Use Zyrtec 10 mg daily.  - Consider allergy shots as long term control of your symptoms by teaching your immune system to be more tolerant of your allergy triggers

## 2023-11-06 NOTE — Progress Notes (Signed)
 FOLLOW UP Date of Service/Encounter:  11/06/23   Subjective:  Amy Molina (DOB: 20-Jun-1961) is a 63 y.o. female who [facility] for follow up for asthma and allergic rhinitis.   History obtained from: chart review and patient. Last seen on 07/28/2023 with Thermon Leyland due to cough/SOB/wheezing with flare up of asthma.  Using [place] but had been off [place]. Started on oral prednisone and restart Harrington Challenger with continuation of Breztri.  In terms of rhinitis, using Zyrtec.   Reports doing a lot better with restarting Fasenra.  Previously was unable to do much activity without wheezing or coughing and now is able to walk and do things.  Also not having as frequent flare ups.  Got the Flu 2.5 weeks ago and had some very mild coughing/wheezing, requiring few days of Albuterol.  Taking Breztri 2 puffs BID.  No other ER visits/oral prednisone use.   Allergies are doing well.  Slight runny nose here and there but responds to Zyrtec.  Some congestion and clear white drainage with Flu but that has since improved.   Past Medical History: Past Medical History:  Diagnosis Date   Anxiety    Arthritis    general stiffness of joints   Asthma    Complication of anesthesia    per pt,"hard to wake up past some sedation!   Diabetes mellitus (HCC)    Diarrhea    Heart murmur    childhood years- no mention in adult yrs   History of indigestion    Hypertension    Morbid obesity (HCC)    Recurrent upper respiratory infection (URI)    Seasonal allergies    Spinal stenosis     Objective:  BP 138/72 (BP Location: Right Arm, Patient Position: Sitting, Cuff Size: Large)   Pulse 88   Temp 98.9 F (37.2 C) (Temporal)   Resp 16   Ht 5\' 5"  (1.651 m)   Wt (!) 309 lb 9.6 oz (140.4 kg)   LMP 08/09/2016   SpO2 95%   BMI 51.52 kg/m  Body mass index is 51.52 kg/m. Physical Exam: GEN: alert, well developed HEENT: clear conjunctiva, nose with mild inferior  turbinate hypertrophy, pink nasal mucosa, clear rhinorrhea, + cobblestoning HEART: regular rate and rhythm, no murmur LUNGS: clear to auscultation bilaterally, no coughing, unlabored respiration SKIN: no rashes or lesions  Spirometry:  Tracings reviewed. Her effort: Good reproducible efforts. FVC: 1.83L, 65% predicted  FEV1: 1.47L, 66% predicted FEV1/FVC ratio: 80% Interpretation: Spirometry consistent with normal pattern.  Please see scanned spirometry results for details.  Assessment:   1. Seasonal and perennial allergic rhinitis   2. Moderate persistent asthma, uncomplicated     Plan/Recommendations:  Moderate Persistent Asthma - Controlled. MDI technique discussed.  Spirometry without obstruction, possible restriction.  Doing well on [place], will continue.  - Maintenance inhaler: Continue Fasenra 30mg  subcutaneous injection every 8 weeks. Continue Breztri 160-9-4.58mcg 2 puffs twice a day with a spacer - Rescue inhaler: Albuterol 2 puffs via spacer or 1 vial via nebulizer every 4-6 hours as needed for respiratory symptoms of cough, shortness of breath, or wheezing Asthma control goals:  Full participation in all desired activities (may need albuterol before activity) Albuterol use two times or less a week on average (not counting use with activity) Cough interfering with sleep two times or less a month Oral steroids no more than once a year No hospitalizations  Allergic Rhinitis - Controlled.  - SPT 05/2021:  positive to grass pollen, tree pollen, mold, dust mite, cat, dog, and cockroach  - Use nasal saline rinses before nose sprays such as with Neilmed Sinus Rinse.  Use distilled water.   - Use Zyrtec 10 mg daily.  - Consider allergy shots as long term control of your symptoms by teaching your immune system to be more tolerant of your allergy triggers     Return in about 3 months (around 02/03/2024).  Alesia Morin, MD [facility]

## 2023-12-04 ENCOUNTER — Ambulatory Visit: Payer: 59

## 2023-12-04 DIAGNOSIS — J4540 Moderate persistent asthma, uncomplicated: Secondary | ICD-10-CM | POA: Diagnosis not present

## 2024-01-29 ENCOUNTER — Ambulatory Visit

## 2024-01-29 DIAGNOSIS — J4550 Severe persistent asthma, uncomplicated: Secondary | ICD-10-CM | POA: Diagnosis not present

## 2024-02-20 NOTE — Patient Instructions (Incomplete)
 Asthma Continue Breztri -2 puffs twice a day with a spacer to prevent cough or wheeze continue albuterol  2 puffs once every 4 hours if needed for cough or wheeze You may use albuterol  2 puffs 5 to 15 minutes before activity to decrease cough or wheeze Continue Fasenra  injections once  every 8 weeks for asthma control  Allergic rhinitis Continue allergen avoidance measures directed toward grass pollen, tree pollen, mold, dust mite, cat, dog, and cockroach as listed below Continue cetirizine  10 mg once a day as needed for runny nose or itch Consider saline nasal rinses as needed for nasal symptoms. Use this before any medicated nasal sprays for best result Consider allergen immunotherapy if your symptoms are not well-controlled with the treatment plan as listed above  History of DVT Continue Eliquis  as prescribed Continue follow-up with your oncologist as recommended  Call the clinic if this treatment plan is not working well for you.  Follow up in 6 months or sooner if needed.  Reducing Pollen Exposure The American Academy of Allergy, Asthma and Immunology suggests the following steps to reduce your exposure to pollen during allergy seasons. Do not hang sheets or clothing out to dry; pollen may collect on these items. Do not mow lawns or spend time around freshly cut grass; mowing stirs up pollen. Keep windows closed at night.  Keep car windows closed while driving. Minimize morning activities outdoors, a time when pollen counts are usually at their highest. Stay indoors as much as possible when pollen counts or humidity is high and on windy days when pollen tends to remain in the air longer. Use air conditioning when possible.  Many air conditioners have filters that trap the pollen spores. Use a HEPA room air filter to remove pollen form the indoor air you breathe.  Control of Mold Allergen Mold and fungi can grow on a variety of surfaces provided certain temperature and moisture  conditions exist.  Outdoor molds grow on plants, decaying vegetation and soil.  The major outdoor mold, Alternaria and Cladosporium, are found in very high numbers during hot and dry conditions.  Generally, a late Summer - Fall peak is seen for common outdoor fungal spores.  Rain will temporarily lower outdoor mold spore count, but counts rise rapidly when the rainy period ends.  The most important indoor molds are Aspergillus and Penicillium.  Dark, humid and poorly ventilated basements are ideal sites for mold growth.  The next most common sites of mold growth are the bathroom and the kitchen.  Outdoor [employer] Use air conditioning and keep windows closed Avoid exposure to decaying vegetation. Avoid leaf raking. Avoid grain handling. Consider wearing a face mask if working in moldy areas.  Indoor Mold Control Maintain humidity below 50%. Clean washable surfaces with 5% bleach solution. Remove sources e.g. Contaminated carpets.   Control of Dust Mite Allergen Dust mites play a major role in allergic asthma and rhinitis. They occur in environments with high humidity wherever human skin is found. Dust mites absorb humidity from the atmosphere (ie, they do not drink) and feed on organic matter (including shed human and animal skin). Dust mites are a microscopic type of insect that you cannot see with the naked eye. High levels of dust mites have been detected from mattresses, pillows, carpets, upholstered furniture, bed covers, clothes, soft toys and any woven material. The principal allergen of the dust mite is found in its feces. A gram of dust may contain 1,000 mites and 250,000 fecal particles. Mite antigen is  easily measured in the air during house cleaning activities. Dust mites do not bite and do not cause harm to humans, other than by triggering allergies/asthma.  Ways to decrease your exposure to dust mites in your home:  1. Encase mattresses, box springs and pillows with a  mite-impermeable barrier or cover  2. Wash sheets, blankets and drapes weekly in hot water (130 F) with detergent and dry them in a dryer on the hot setting.  3. Have the room cleaned frequently with a vacuum cleaner and a damp dust-mop. For carpeting or rugs, vacuuming with a vacuum cleaner equipped with a high-efficiency particulate air (HEPA) filter. The dust mite allergic individual should not be in a room which is being cleaned and should wait 1 hour after cleaning before going into the room.  4. Do not sleep on upholstered furniture (eg, couches).  5. If possible removing carpeting, upholstered furniture and drapery from the home is ideal. Horizontal blinds should be eliminated in the rooms where the person spends the most time (bedroom, study, television room). Washable vinyl, roller-type shades are optimal.  6. Remove all non-washable stuffed toys from the bedroom. Wash stuffed toys weekly like sheets and blankets above.  7. Reduce indoor humidity to less than 50%. Inexpensive humidity monitors can be purchased at most hardware stores. Do not use a humidifier as can make the problem worse and are not recommended.  Control of Dog or Cat Allergen Avoidance is the best way to manage a dog or cat allergy. If you have a dog or cat and are allergic to dog or cats, consider removing the dog or cat from the home. If you have a dog or cat but don't want to find it a new home, or if your family wants a pet even though someone in the household is allergic, here are some strategies that may help keep symptoms at bay:  Keep the pet out of your bedroom and restrict it to only a few rooms. Be advised that keeping the dog or cat in only one room will not limit the allergens to that room. Don't pet, hug or kiss the dog or cat; if you do, wash your hands with soap and water. High-efficiency particulate air (HEPA) cleaners run continuously in a bedroom or living room can reduce allergen levels over  time. Regular use of a high-efficiency vacuum cleaner or a central vacuum can reduce allergen levels. Giving your dog or cat a bath at least once a week can reduce airborne allergen.  Control of Cockroach Allergen Cockroach allergen has been identified as an important cause of acute attacks of asthma, especially in urban settings.  There are fifty-five species of cockroach that exist in the [place] , however only three, the [place], [place] and [place] species produce allergen that can affect patients with Asthma.  Allergens can be obtained from fecal particles, egg casings and secretions from cockroaches.    Remove food sources. Reduce access to water. Seal access and entry points. Spray runways with 0.5-1% Diazinon or Chlorpyrifos Blow boric acid power under stoves and refrigerator. Place bait stations (hydramethylnon) at feeding sites.

## 2024-02-20 NOTE — Progress Notes (Signed)
 [address] [place] [address] Dept: [phone]  FOLLOW UP NOTE  Patient ID: Amy Molina, female    DOB: [birth year]  Age: 63 y.o. MRN: [id number] Date of Office Visit: 02/21/2024  Assessment  Chief Complaint: Follow-up (Green thick mucus- having to use albuterol  frequently )  HPI Amy Molina is a 63 year old female who presents to the clinic for a follow-up visit.  She was last seen in this clinic on 11/06/2023 by Dr. Tobie for evaluation of asthma and allergic rhinitis.  In the interim, she reports that about 3 weeks ago she went to a family dinner where she was exposed to several sick contacts.  At today's visit, she reports her asthma has been not well-controlled over the last 2 to 3 weeks with symptoms including shortness of breath and wheeze which is worse with activity as well as cough producing clear mucus that has changed to green mucus for the last several days.  She continues Breztri  2 puffs twice a day, albuterol  before work or daily and occasionally for relief of asthma symptoms.  She continues Fasenra  injections once every 2 months with no large or local reactions.  She reports a significant decrease in her symptoms of asthma while continuing on Fasenra .  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  She denies recent fever or sweats.  She does report one time when she was chilly and she also reports several sick contacts.  Allergic rhinitis is reported as moderately well-controlled with nasal congestion and postnasal drainage as the main symptoms.  She denies headache at this time.  She does report occasional ear itching.  She continues Claritin  10 mg once a day and is not currently using Flonase or nasal saline rinses. Her last environmental allergy skin testing on 04/05/2023 was positive to grass pollen, tree pollen, mold, dust mite, cat, dog, and cockroach.  She  reports reflux has been well-controlled with no symptoms including heartburn or vomiting.  She continues omeprazole  daily without relief of symptoms.  Her current medications are listed in the chart.  Drug Allergies:  Allergies  Allergen Reactions   Penicillins Anaphylaxis, Shortness Of Breath and Swelling    DID THE REACTION INVOLVE: Swelling of the face/tongue/throat, SOB, or low BP? Yes Sudden or severe rash/hives, skin peeling, or the inside of the mouth or nose? No Did it require medical treatment? Yes When did it last happen?      15+ years If all above answers are "NO", may proceed with cephalosporin use.  PATIENT TOLERATED ZOSYN  AND MERREM  ON 07/21/17 ADMISSION    Floxin [Ofloxacin]     MIGRAINES, INSOMNIA, PARANOIND, RINGING IN EARS   Sulfa  Antibiotics Hives   Lipitor [Atorvastatin]     Severe myalgias   Penicillin G Benzathine Rash    Physical Exam: LMP 08/09/2016    Physical Exam Vitals reviewed.  Constitutional:      Appearance: Normal appearance.  HENT:     Head: Normocephalic and atraumatic.     Right Ear: Tympanic membrane normal.     Left Ear: Tympanic membrane normal.     Nose:     Comments: Bilateral nares slightly erythematous with thin clear nasal drainage noted.  Pharynx normal.  Ears normal.  Eyes normal.    Mouth/Throat:     Pharynx: Oropharynx is clear.  Eyes:     Conjunctiva/sclera: Conjunctivae normal.  Cardiovascular:  Rate and Rhythm: Normal rate and regular rhythm.     Heart sounds: Normal heart sounds. No murmur heard. Pulmonary:     Effort: Pulmonary effort is normal.     Breath sounds: Normal breath sounds.     Comments: Lungs clear to auscultation Musculoskeletal:        General: Normal range of motion.     Cervical back: Normal range of motion and neck supple.  Skin:    General: Skin is warm and dry.  Neurological:     Mental Status: She is alert and oriented to person, place, and time.  Psychiatric:        Mood and Affect:  Mood normal.        Behavior: Behavior normal.        Thought Content: Thought content normal.        Judgment: Judgment normal.     Diagnostics: FVC 2.69 which is 60% of predicted value, FEV1 1.31 which is 59% of predicted value.  Spirometry indicates possible restriction.  This is consistent with previous spirometry readings.  Assessment and Plan: 1. Severe persistent asthma with acute exacerbation   2. Seasonal and perennial allergic rhinitis   3. Gastroesophageal reflux disease, unspecified whether esophagitis present   4. History of DVT (deep vein thrombosis)     Meds ordered this encounter  Medications   BREZTRI  AEROSPHERE 160-9-4.8 MCG/ACT AERO inhaler    Sig: Inhale 2 puffs into the lungs in the morning and at bedtime.    Dispense:  33 g    Refill:  1   predniSONE  (DELTASONE ) 10 MG tablet    Sig: Begin prednisone  10 mg tablets. Take 2 tablets twice a day for 3 days, then take 2 tablets once a day for 1 day, then take 1 tablet on the 5th day, then stop    Dispense:  15 tablet    Refill:  0    Patient Instructions  Asthma Begin prednisone  10 mg tablets. Take 2 tablets twice a day for 3 days, then take 2 tablets once a day for 1 day, then take 1 tablet on the 5th day, then stop  Continue Breztri -2 puffs twice a day with a spacer to prevent cough or wheeze continue albuterol  2 puffs once every 4 hours if needed for cough or wheeze You may use albuterol  2 puffs 5 to 15 minutes before activity to decrease cough or wheeze Continue Fasenra  injections once  every 8 weeks for asthma control Call the clinic if your breathing worsens or if you develop a fever. We will move forward with chest xray if needed at that time  Allergic rhinitis Continue allergen avoidance measures directed toward grass pollen, tree pollen, mold, dust mite, cat, dog, and cockroach as listed below Hold cetirizine  10 mg for the next 5 days, then continue once a day as needed for runny nose or itch Continue  saline nasal rinses as needed for nasal symptoms. Use this before any medicated nasal sprays for best result Continue Mucinex 1200 mg twice a day and increase fluid intake as tolerated to thin out mucus Consider allergen immunotherapy if your symptoms are not well-controlled with the treatment plan as listed above  History of DVT Continue Eliquis  as prescribed Continue follow-up with your oncologist as recommended  Call the clinic if this treatment plan is not working well for you.  Follow up in 2 months or sooner if needed.   Return in about 2 months (around 04/22/2024), or if symptoms worsen or  fail to improve.    Thank you for the opportunity to care for this patient.  Please do not hesitate to contact me with questions.  Arlean Mutter, FNP [facility] 

## 2024-02-21 ENCOUNTER — Ambulatory Visit: Payer: 59 | Admitting: Family Medicine

## 2024-02-21 ENCOUNTER — Encounter: Payer: Self-pay | Admitting: Family Medicine

## 2024-02-21 DIAGNOSIS — J302 Other seasonal allergic rhinitis: Secondary | ICD-10-CM

## 2024-02-21 DIAGNOSIS — J4551 Severe persistent asthma with (acute) exacerbation: Secondary | ICD-10-CM

## 2024-02-21 DIAGNOSIS — K219 Gastro-esophageal reflux disease without esophagitis: Secondary | ICD-10-CM | POA: Diagnosis not present

## 2024-02-21 DIAGNOSIS — Z86718 Personal history of other venous thrombosis and embolism: Secondary | ICD-10-CM

## 2024-02-21 DIAGNOSIS — J3089 Other allergic rhinitis: Secondary | ICD-10-CM

## 2024-02-21 MED ORDER — PREDNISONE 10 MG PO TABS: ORAL_TABLET | ORAL | 0 refills | Status: DC

## 2024-02-21 MED ORDER — BREZTRI AEROSPHERE 160-9-4.8 MCG/ACT IN AERO: 2.0000 | INHALATION_SPRAY | Freq: Two times a day (BID) | RESPIRATORY_TRACT | 1 refills | Status: DC

## 2024-02-21 NOTE — Addendum Note (Signed)
 Addended by: Anthon Baston A on: 02/21/2024 04:20 PM   Modules accepted: Orders

## 2024-03-25 ENCOUNTER — Ambulatory Visit

## 2024-03-25 DIAGNOSIS — J4550 Severe persistent asthma, uncomplicated: Secondary | ICD-10-CM

## 2024-04-15 ENCOUNTER — Emergency Department (EMERGENCY_DEPARTMENT_HOSPITAL)

## 2024-04-15 ENCOUNTER — Encounter (HOSPITAL_BASED_OUTPATIENT_CLINIC_OR_DEPARTMENT_OTHER): Payer: Self-pay | Admitting: Emergency Medicine

## 2024-04-15 ENCOUNTER — Emergency Department (HOSPITAL_BASED_OUTPATIENT_CLINIC_OR_DEPARTMENT_OTHER)
Admission: EM | Admit: 2024-04-15 | Discharge: 2024-04-15 | Disposition: A | Discharge status: Home / Self Care | Attending: Emergency Medicine | Admitting: Emergency Medicine

## 2024-04-15 ENCOUNTER — Emergency Department (HOSPITAL_BASED_OUTPATIENT_CLINIC_OR_DEPARTMENT_OTHER)

## 2024-04-15 ENCOUNTER — Other Ambulatory Visit: Payer: Self-pay

## 2024-04-15 DIAGNOSIS — I10 Essential (primary) hypertension: Secondary | ICD-10-CM | POA: Insufficient documentation

## 2024-04-15 DIAGNOSIS — R519 Headache, unspecified: Secondary | ICD-10-CM

## 2024-04-15 DIAGNOSIS — Z7901 Long term (current) use of anticoagulants: Secondary | ICD-10-CM | POA: Insufficient documentation

## 2024-04-15 DIAGNOSIS — Z7984 Long term (current) use of oral hypoglycemic drugs: Secondary | ICD-10-CM | POA: Insufficient documentation

## 2024-04-15 DIAGNOSIS — Z79899 Other long term (current) drug therapy: Secondary | ICD-10-CM | POA: Diagnosis not present

## 2024-04-15 DIAGNOSIS — R2681 Unsteadiness on feet: Secondary | ICD-10-CM

## 2024-04-15 DIAGNOSIS — R42 Dizziness and giddiness: Secondary | ICD-10-CM | POA: Diagnosis not present

## 2024-04-15 DIAGNOSIS — Z8673 Personal history of transient ischemic attack (TIA), and cerebral infarction without residual deficits: Secondary | ICD-10-CM | POA: Diagnosis not present

## 2024-04-15 DIAGNOSIS — E119 Type 2 diabetes mellitus without complications: Secondary | ICD-10-CM | POA: Insufficient documentation

## 2024-04-15 DIAGNOSIS — R269 Unspecified abnormalities of gait and mobility: Secondary | ICD-10-CM | POA: Diagnosis not present

## 2024-04-15 DIAGNOSIS — D72829 Elevated white blood cell count, unspecified: Secondary | ICD-10-CM | POA: Insufficient documentation

## 2024-04-15 LAB — CBC
HCT: 38.9 % (ref 36.0–46.0)
Hemoglobin: 12.7 g/dL (ref 12.0–15.0)
MCH: 31.1 pg (ref 26.0–34.0)
MCHC: 32.6 g/dL (ref 30.0–36.0)
MCV: 95.3 fL (ref 80.0–100.0)
Platelets: 245 K/uL (ref 150–400)
RBC: 4.08 MIL/uL (ref 3.87–5.11)
RDW: 13.2 % (ref 11.5–15.5)
WBC: 11.7 K/uL — ABNORMAL HIGH (ref 4.0–10.5)
nRBC: 0 % (ref 0.0–0.2)

## 2024-04-15 LAB — URINE DRUG SCREEN
Amphetamines: NOT DETECTED
Barbiturates: NOT DETECTED
Benzodiazepines: NOT DETECTED
Cocaine: NOT DETECTED
Fentanyl: NOT DETECTED
Methadone Scn, Ur: NOT DETECTED
Opiates: NOT DETECTED
Tetrahydrocannabinol: NOT DETECTED

## 2024-04-15 LAB — COMPREHENSIVE METABOLIC PANEL WITH GFR
ALT: 14 U/L (ref 0–44)
AST: 25 U/L (ref 15–41)
Albumin: 3.9 g/dL (ref 3.5–5.0)
Alkaline Phosphatase: 85 U/L (ref 38–126)
Anion gap: 11 (ref 5–15)
BUN: 8 mg/dL (ref 6–20)
CO2: 26 mmol/L (ref 22–32)
Calcium: 9.3 mg/dL (ref 8.9–10.3)
Chloride: 105 mmol/L (ref 98–111)
Creatinine, Ser: 0.66 mg/dL (ref 0.44–1.00)
GFR, Estimated: >60 mL/min (ref >60)
Glucose, Bld: 89 mg/dL (ref 70–99)
Potassium: 3.8 mmol/L (ref 3.5–5.1)
Sodium: 141 mmol/L (ref 135–145)
Total Bilirubin: 0.7 mg/dL (ref 0.0–1.2)
Total Protein: 7.3 g/dL (ref 6.5–8.1)

## 2024-04-15 LAB — APTT: aPTT: 33 s (ref 24–36)

## 2024-04-15 LAB — PROTIME-INR
INR: 1.1 (ref 0.8–1.2)
Prothrombin Time: 15.3 s — ABNORMAL HIGH (ref 11.4–15.2)

## 2024-04-15 LAB — DIFFERENTIAL
Abs Immature Granulocytes: 0.05 K/uL (ref 0.00–0.07)
Basophils Absolute: 0 K/uL (ref 0.0–0.1)
Basophils Relative: 0 %
Eosinophils Absolute: 0 K/uL (ref 0.0–0.5)
Eosinophils Relative: 0 %
Immature Granulocytes: 0 %
Lymphocytes Relative: 33 %
Lymphs Abs: 3.8 K/uL (ref 0.7–4.0)
Monocytes Absolute: 1.1 K/uL — ABNORMAL HIGH (ref 0.1–1.0)
Monocytes Relative: 10 %
Neutro Abs: 6.7 K/uL (ref 1.7–7.7)
Neutrophils Relative %: 57 %

## 2024-04-15 LAB — ETHANOL: Alcohol, Ethyl (B): <15 mg/dL (ref <15)

## 2024-04-15 MED ORDER — IOHEXOL 350 MG/ML SOLN
100.0000 mL | Freq: Once | INTRAVENOUS | Status: AC | PRN
Start: 2024-04-15 — End: 2024-04-15
  Administered 2024-04-15: 100 mL via INTRAVENOUS

## 2024-04-15 MED ORDER — METOCLOPRAMIDE HCL 5 MG/ML IJ SOLN
10.0000 mg | Freq: Once | INTRAMUSCULAR | Status: AC
  Administered 2024-04-15: 10 mg via INTRAVENOUS
  Filled 2024-04-15: qty 2

## 2024-04-15 MED ORDER — MECLIZINE HCL 25 MG PO TABS: 25.0000 mg | ORAL_TABLET | Freq: Three times a day (TID) | ORAL | 0 refills | Status: AC | PRN

## 2024-04-15 NOTE — ED Provider Notes (Signed)
  Physical Exam  BP (!) 152/85 (BP Location: Right Arm)   Pulse 66   Temp 98.7 F (37.1 C) (Oral)   Resp 18   LMP 08/09/2016   SpO2 99%   Physical Exam Vitals and nursing note reviewed.  Constitutional:      General: She is not in acute distress.    Appearance: She is well-developed.  HENT:     Head: Normocephalic and atraumatic.  Eyes:     Conjunctiva/sclera: Conjunctivae normal.  Cardiovascular:     Rate and Rhythm: Normal rate and regular rhythm.     Heart sounds: No murmur heard. Pulmonary:     Effort: Pulmonary effort is normal. No respiratory distress.     Breath sounds: Normal breath sounds.  Abdominal:     Palpations: Abdomen is soft.     Tenderness: There is no abdominal tenderness.  Musculoskeletal:        General: No swelling.     Cervical back: Neck supple.  Skin:    General: Skin is warm and dry.     Capillary Refill: Capillary refill takes less than 2 seconds.  Neurological:     Mental Status: She is alert.  Psychiatric:        Mood and Affect: Mood normal.     Procedures  Procedures  ED Course / MDM    Medical Decision Making Amount and/or Complexity of Data Reviewed Labs: ordered. Radiology: ordered.  Risk Prescription drug management.   Endorsed a headache that started at 3 AM.  Vertiginous.   Had CTA which was negative for acute intracranial bleed or large occlusion.  Arterial stenosis.  Negative head CT.  Chronic left cerebellar infarct.  Patient did have some difficulty walking because of some difficulty with balance.  Still feeling vertiginous.  Was sent to [facility] for MRI brain to rule out any kind of evidence of posterior CVA.  Upon exam, patient ANO x 3 GCS 15.  No focal deficit.  Cranials 2 through 12 intact.  NIH is 0.  Normal finger-nose at this point time.  Maybe some slight horizontal nystagmus.  Otherwise, unremarkable neuroexam.  Negative Kernig's and Brudzinski's no pain with neck flexion.  No concerns of meningitis.  No  meningeal symptoms.  Brain unremarkable.  No acute infarct.  Does show old cerebellar infarct that she is aware of.  Remains neuro intact.  Will be discharged home with meclizine .     Simon Lavonia SAILOR, MD 04/15/24 1410

## 2024-04-15 NOTE — ED Provider Notes (Signed)
 [place] EMERGENCY DEPARTMENT AT [facility] Provider Note   CSN: [id number] Arrival date & time: 04/15/24  9293     Patient presents with: Headache   Amy Molina is a 63 y.o. female.   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  She reports this started approximately 3 AM this morning while she was at work.  She was having a difficult time with some stuff at work and trying to get someone to come help her and they were not being helpful and she started to get very angry and frustrated.  She reports the symptoms started shortly after this.  She states initially started with a headache on the right side of her head that moved around to the back of her head and then would fluctuate between those 2 areas.  She then started noticing she felt off balance when she tried to walk.  She went back and laid her head down in the break room and reported that that did help her symptoms but they would get worse anytime she had to get up or try to walk the headache would become worse.  At one time she felt the headache was starting to go down her neck but it then stopped and has remained in her head.  She denies any visual changes.  She denies feeling truly dizzy but states when she walks she might feel a little bit dizzy.  She has no vertiginous symptoms.  She denies any ringing in her ears.  However she reports every time she tries to walk she feels she needs to hold onto something or all she might fall.  She has had some mild nausea but denies any vomiting.  She also reported while this was happening at work she stated that colleagues said they could not understand what she was saying and she had to text.  She feels that her speech is normal at this time.  She denies any facial numbness and denies any extremity numbness or weakness.  She felt  that she could pick up her legs accurately but that her balance was just off.  She reports that she has never had a headache or symptoms like this in the past.  She has been compliant with her medication and last took her blood pressure medication last night before she went to work at 6 PM.  She has not had any falls.  She reports symptoms are little bit better now than they were at work but she is still having a headache.  The history is provided by the patient.  Headache      Prior to Admission medications   Medication Sig Start Date End Date Taking? Authorizing Provider  acetaminophen  (TYLENOL ) 500 MG tablet Take 1,000 mg by mouth 2 (two) times daily as needed for moderate pain.    [provider]  albuterol  (PROVENTIL ) (2.5 MG/3ML) 0.083% nebulizer solution Take 3 mLs (2.5 mg total) by nebulization every 6 (six) hours as needed for wheezing or shortness of breath. 11/06/23   Tobie Arleta SQUIBB, MD  albuterol  (VENTOLIN  HFA) 108 (90 Base) MCG/ACT inhaler Inhale 2 puffs into the lungs every 4 (four) hours as needed for wheezing or shortness of breath. 11/06/23   Tobie Arleta SQUIBB, MD  ALPRAZolam  (XANAX ) 0.5 MG tablet Take 1 tablet (0.5 mg total) by mouth 3 (three) times daily as needed  for sleep or anxiety. 12/26/17   Jayne Vonn DEL, MD  apixaban  (ELIQUIS ) 5 MG TABS tablet Take 1 tablet (5 mg total) by mouth 2 (two) times daily. 07/18/23   Lamon Pleasant HERO, PA-C  BREZTRI  AEROSPHERE 160-9-4.8 MCG/ACT AERO inhaler Inhale 2 puffs into the lungs in the morning and at bedtime. 02/21/24   Cari Arlean HERO, FNP  cetirizine  (ZYRTEC ) 10 MG tablet Take 1 tablet (10 mg total) by mouth daily. 11/06/23   Tobie Arleta SQUIBB, MD  Doxepin HCl 5 % CREA Apply 1 Application topically daily at 6 (six) AM.    [provider]  escitalopram  (LEXAPRO ) 20 MG tablet Take 1 tablet by mouth once daily 11/29/20   Jayne Vonn DEL, MD  FASENRA  PEN 30 MG/ML prefilled autoinjector INJECT 1 PEN SUBCUTANEOUSLY  EVERY 8 WEEKS  11/02/23   Cheryl Reusing, FNP  furosemide  (LASIX ) 20 MG tablet Take 20 mg by mouth daily. 04/29/20   [provider]  loperamide (IMODIUM A-D) 2 MG tablet Take 2 mg by mouth daily as needed for diarrhea or loose stools.    [provider]  loratadine  (CLARITIN ) 10 MG tablet Take 10 mg by mouth daily.    [provider]  losartan  (COZAAR ) 50 MG tablet Take 1 tablet (50 mg total) by mouth daily. Patient taking differently: Take 25 mg by mouth daily. 11/27/13   Duanne Butler DASEN, MD  metFORMIN  (GLUCOPHAGE ) 1000 MG tablet Take 1 tablet (1,000 mg total) by mouth daily with breakfast. 11/27/13   Duanne Butler DASEN, MD  MOUNJARO 2.5 MG/0.5ML Pen SMARTSIG:0.5 Milliliter(s) SUB-Q Once a Week 07/02/23   [provider]  omeprazole  (PRILOSEC) 20 MG capsule Take 1 capsule (20 mg total) by mouth daily. 04/05/23 07/04/23  Iva Marty Saltness, MD  predniSONE  (DELTASONE ) 10 MG tablet Begin prednisone  10 mg tablets. Take 2 tablets twice a day for 3 days, then take 2 tablets once a day for 1 day, then take 1 tablet on the 5th day, then stop 02/21/24   Ambs, Arlean HERO, FNP  Spacer/Aero-Holding Chambers (AEROCHAMBER PLUS FLO-VU LARGE) MISC Use as directed with metered dose inhaler. 06/02/21   Iva Marty Saltness, MD  Tetrahydrozoline HCl (VISINE OP) Place 1 drop into both eyes daily as needed (dry eyes).    [provider]    Allergies: Penicillins, Floxin [ofloxacin], Sulfa  antibiotics, Lipitor [atorvastatin], and Penicillin g benzathine    Review of Systems  Neurological:  Positive for headaches.    Updated Vital Signs BP (!) 152/85 (BP Location: Right Arm)   Pulse 66   Temp 98.7 F (37.1 C) (Oral)   Resp 18   LMP 08/09/2016   SpO2 99%   Physical Exam Vitals and nursing note reviewed.  Constitutional:      General: She is not in acute distress.    Appearance: She is well-developed.  HENT:     Head: Normocephalic and atraumatic.  Eyes:     General: No visual  field deficit.    Conjunctiva/sclera: Conjunctivae normal.     Pupils: Pupils are equal, round, and reactive to light.  Neck:     Comments: Patient is able to completely range her neck and palpation of the neck without any pain. Cardiovascular:     Rate and Rhythm: Normal rate and regular rhythm.     Heart sounds: No murmur heard. Pulmonary:     Effort: Pulmonary effort is normal. No respiratory distress.     Breath sounds: Normal breath sounds. No wheezing or  rales.  Abdominal:     General: There is no distension.     Palpations: Abdomen is soft.     Tenderness: There is no abdominal tenderness. There is no guarding or rebound.  Musculoskeletal:        General: No tenderness. Normal range of motion.     Cervical back: Normal range of motion and neck supple. No tenderness.  Skin:    General: Skin is warm and dry.     Findings: No erythema or rash.  Neurological:     Mental Status: She is alert and oriented to person, place, and time.     Cranial Nerves: No dysarthria or facial asymmetry.     Sensory: Sensation is intact. No sensory deficit.     Motor: Motor function is intact. No weakness.     Coordination: Coordination is intact. Coordination normal. Finger-Nose-Finger Test and Heel to [place] Test normal.     Comments: Patient not currently ambulated due to her sensation of being off balance  Psychiatric:        Behavior: Behavior normal.     (all labs ordered are listed, but only abnormal results are displayed) Labs Reviewed  PROTIME-INR - Abnormal; Notable for the following components:      Result Value   Prothrombin Time 15.3 (*)    All other components within normal limits  CBC - Abnormal; Notable for the following components:   WBC 11.7 (*)    All other components within normal limits  DIFFERENTIAL - Abnormal; Notable for the following components:   Monocytes Absolute 1.1 (*)    All other components within normal limits  APTT  COMPREHENSIVE METABOLIC PANEL WITH GFR   ETHANOL  URINE DRUG SCREEN    EKG: EKG Interpretation Date/Time:  Monday April 15 2024 07:57:12 EDT Ventricular Rate:  68 PR Interval:  203 QRS Duration:  102 QT Interval:  428 QTC Calculation: 456 R Axis:   -54  Text Interpretation: Sinus rhythm Ventricular premature complex Borderline prolonged PR interval LAD, consider left anterior fascicular block Probable anteroseptal infarct, old No significant change since last tracing Confirmed by Doretha Folks ([id number]) on 04/15/2024 9:00:58 AM  Radiology: CT ANGIO HEAD NECK W WO CM Result Date: 04/15/2024 CLINICAL DATA:  63 year old female with headache onset 0400 hours. Neurologic deficit. EXAM: CT ANGIOGRAPHY HEAD AND NECK WITH AND WITHOUT CONTRAST TECHNIQUE: Multidetector CT imaging of the head and neck was performed using the standard protocol during bolus administration of intravenous contrast. Multiplanar CT image reconstructions and MIPs were obtained to evaluate the vascular anatomy. Carotid stenosis measurements (when applicable) are obtained utilizing NASCET criteria, using the distal internal carotid diameter as the denominator. RADIATION DOSE REDUCTION: This exam was performed according to the departmental dose-optimization program which includes automated exposure control, adjustment of the mA and/or kV according to patient size and/or use of iterative reconstruction technique. CONTRAST:  OMNIPAQUE  IOHEXOL  350 MG/ML SOLN COMPARISON:  Brain MRI 03/04/2023.  Head CT 03/05/2023. FINDINGS: CT HEAD Brain: Patchy chronic left cerebellum PICA and/or AICA territory infarcts appear unchanged (series 4, image 8). Normal background brain volume.  Cavum paired a, normal variant. No midline shift, ventriculomegaly, mass effect, evidence of mass lesion, intracranial hemorrhage or evidence of cortically based acute infarction. Stable gray-white matter differentiation throughout the brain. Calvarium and skull base: Stable and intact. Paranasal  sinuses: Mild to moderate bilateral paranasal sinus mucosal thickening is increased from last year, more pronounced on the right. No sinus fluid. Tympanic cavities and mastoids remain well  aerated. Orbits: Stable orbit and scalp soft tissues. CTA NECK Skeleton: Cervical and visible upper thoracic disc and endplate degeneration. No acute osseous abnormality identified. Upper chest: Mild upper lung atelectasis. Maximal right paratracheal lymph nodes, nonspecific but might be reactive. Other neck: Partially retropharyngeal carotids, especially the right. Otherwise nonvascular neck soft tissues are within normal limits. Aortic arch: Calcified aortic atherosclerosis.  3 vessel arch. Right carotid system: Brachiocephalic artery and proximal right CCA mildly tortuous. Retropharyngeal right carotid and bifurcation with mild soft plaque. Highly tortuous right ICA distal to the bulb. No stenosis. Left carotid system: Negative left CCA. Mild soft plaque at the left carotid bifurcation. Highly tortuous left ICA similar to the right side. No stenosis to the skull base. Vertebral arteries: Tortuous proximal right subclavian artery without plaque or stenosis. Mild calcified plaque at the right vertebral artery origin without stenosis. Patent right vertebral artery to the skull base with no significant atherosclerosis or stenosis. Proximal left subclavian artery and left vertebral artery origin appear normal. Codominant left vertebral artery is patent to the skull base with no significant plaque or stenosis. CTA HEAD Posterior circulation: Distal vertebral arteries and vertebrobasilar junction appear patent without stenosis. The left V4 segment appears mildly dominant. Normal right PICA origin. Left AICA appears dominant and patent. No distal vertebral stenosis. Patent basilar artery without stenosis. Patent SCA and PCA origins. Posterior communicating arteries are diminutive or absent. Bilateral PCA branches are within normal  limits. Anterior circulation: Both ICA siphons are patent. Left siphon mild cavernous segment calcified plaque without stenosis. Similar right siphon mild distal cavernous and proximal supraclinoid calcified plaque without stenosis. Patent carotid termini, MCA and ACA origins. Diminutive or absent anterior communicating artery. Bilateral ACA branches are within normal limits. MCA M1 segments are patent, the right bifurcates early without stenosis. Bilateral MCA branches are within normal limits. Venous sinuses: Patent. Dominant right transverse and sigmoid venous sinus. Anatomic variants: Mildly dominant left vertebral artery, right transverse and sigmoid venous sinus. Review of the MIP images confirms the above findings IMPRESSION: 1. Negative for large vessel occlusion. 2. Arterial tortuosity but mild for age atherosclerosis in the head and neck. No arterial stenosis. 3. 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 No acute intracranial abnormality. 4.  Aortic Atherosclerosis (ICD10-I70.0). Electronically Signed   By: VEAR Hurst M.D.   On: 04/15/2024 08:46     Procedures   Medications Ordered in the ED  metoCLOPramide  (REGLAN ) injection 10 mg (10 mg Intravenous Given 04/15/24 0746)  iohexol  (OMNIPAQUE ) 350 MG/ML injection 100 mL (100 mLs Intravenous Contrast Given 04/15/24 0824)                                    Medical Decision Making Amount and/or Complexity of Data Reviewed External Data Reviewed: notes. Labs: ordered. Decision-making details documented in ED Course. Radiology: ordered and independent interpretation performed. Decision-making details documented in ED Course. ECG/medicine tests: ordered and independent interpretation performed. Decision-making details documented in ED Course.  Risk Prescription drug management.   Pt with multiple medical problems and comorbidities and presenting today with a complaint that caries a high risk for morbidity and mortality.  Here  today with the above complaints.  Concern for possible subarachnoid hemorrhage, vertebral dissection, stroke, hypertensive urgency also possible complicated migraine.  Low suspicion for acute cardiac cause such as ACS.  Lower suspicion for anemia or electrolyte abnormalities. Patient's symptoms started almost 5 hours ago  now and she is not in the window for code stroke.  At this time NIH is 0.  Labs and imaging are pending.  Patient given headache cocktail.  9:30 AM I independently interpreted patient's EKG and labs.  Coags are normal, CBC with a minimal leukocytosis of 11 and a normal hemoglobin, CMP within normal limits, EKG without acute findings.  I have independently visualized and interpreted pt's images today.  CTA is negative for intracranial bleed or large occlusion.  Radiology reports arterial tortuosity but mild for age atherosclerosis in the head and neck no arterial stenosis and a negative head CT other than a chronic left cerebellar infarct.  On repeat evaluation attempted to get patient up and walk her.  She was able to get out of the bed okay and Romberg was negative however when she tried to turn or change position she stumbled and had to be rebalanced which she said this is very abnormal for her and she still feels dizzy.  Given patient's prior history feel that she needs an MRI to ensure no evidence of new stroke.  Will transfer to [facility] for MRI.  Otherwise if MRI is negative and patient is improved she can most likely go home to follow-up with neurology.  Patient accepted to [facility] and was transferred for further evaluation.       Final diagnoses:  Dizziness  Gait disturbance    ED Discharge Orders     None          Doretha Folks, MD 04/15/24 0930

## 2024-04-15 NOTE — ED Notes (Signed)
 Patient transported to MRI

## 2024-04-15 NOTE — ED Triage Notes (Signed)
 Pt coming in from [facility] for an MRI. Pt was at work when she had a headache with an episode of dizzyness and issues with her speech. Pt went to [facility]  and had a CTA this was negative. Pt came here for an MRI. Pt has a 20 in the right forearm.    [facility]  vitals  151/87 Hr 64  95% on ra Rr17

## 2024-04-15 NOTE — ED Notes (Signed)
 Called Thomas at [facility] for transport

## 2024-04-15 NOTE — Discharge Instructions (Addendum)
 Follow-up with neurology given your history of remote stroke in the past.  No evidence of acute stroke at this point time.  I do think you should follow-up with them given your symptoms.  Please stay well-hydrated.  This can help with the vertigo.  I will also call in meclizine  just in case you have worsening vertigo.  You can try to see if this helps.

## 2024-04-15 NOTE — ED Triage Notes (Signed)
 C/o headache since 4am this morning while at work. Denies unilateral deficits. No vision changes.

## 2024-04-23 NOTE — Patient Instructions (Addendum)
 Asthma Continue Breztri -2 puffs twice a day with a spacer to prevent cough or wheeze Continue albuterol  2 puffs once every 4 hours if needed for cough or wheeze You may use albuterol  2 puffs 5 to 15 minutes before activity to decrease cough or wheeze Continue Fasenra  injections once  every 8 weeks for asthma control  Allergic rhinitis Continue allergen avoidance measures directed toward grass pollen, tree pollen, mold, dust mite, cat, dog, and cockroach as listed below Continue cetirizine  10 mg once a day as needed for runny nose or itch Continue saline nasal rinses as needed for nasal symptoms. Use this before any medicated nasal sprays for best result Continue Mucinex 1200 mg twice a day and increase fluid intake as tolerated to thin out mucus Consider allergen immunotherapy if your symptoms are not well-controlled with the treatment plan as listed above  History of DVT Continue Eliquis  as prescribed Continue follow-up with your oncologist as recommended  Dizziness and headache Follow-up with your neurologist as recommended  Call the clinic if this treatment plan is not working well for you.  Follow up in 6 months or sooner if needed.  Reducing Pollen Exposure The American Academy of Allergy, Asthma and Immunology suggests the following steps to reduce your exposure to pollen during allergy seasons. Do not hang sheets or clothing out to dry; pollen may collect on these items. Do not mow lawns or spend time around freshly cut grass; mowing stirs up pollen. Keep windows closed at night.  Keep car windows closed while driving. Minimize morning activities outdoors, a time when pollen counts are usually at their highest. Stay indoors as much as possible when pollen counts or humidity is high and on windy days when pollen tends to remain in the air longer. Use air conditioning when possible.  Many air conditioners have filters that trap the pollen spores. Use a HEPA room air filter to  remove pollen form the indoor air you breathe.  Control of Mold Allergen Mold and fungi can grow on a variety of surfaces provided certain temperature and moisture conditions exist.  Outdoor molds grow on plants, decaying vegetation and soil.  The major outdoor mold, Alternaria and Cladosporium, are found in very high numbers during hot and dry conditions.  Generally, a late Summer - Fall peak is seen for common outdoor fungal spores.  Rain will temporarily lower outdoor mold spore count, but counts rise rapidly when the rainy period ends.  The most important indoor molds are Aspergillus and Penicillium.  Dark, humid and poorly ventilated basements are ideal sites for mold growth.  The next most common sites of mold growth are the bathroom and the kitchen.  Outdoor [employer] Use air conditioning and keep windows closed Avoid exposure to decaying vegetation. Avoid leaf raking. Avoid grain handling. Consider wearing a face mask if working in moldy areas.  Indoor Mold Control Maintain humidity below 50%. Clean washable surfaces with 5% bleach solution. Remove sources e.g. Contaminated carpets.   Control of Dust Mite Allergen Dust mites play a major role in allergic asthma and rhinitis. They occur in environments with high humidity wherever human skin is found. Dust mites absorb humidity from the atmosphere (ie, they do not drink) and feed on organic matter (including shed human and animal skin). Dust mites are a microscopic type of insect that you cannot see with the naked eye. High levels of dust mites have been detected from mattresses, pillows, carpets, upholstered furniture, bed covers, clothes, soft toys and any woven material.  The principal allergen of the dust mite is found in its feces. A gram of dust may contain 1,000 mites and 250,000 fecal particles. Mite antigen is easily measured in the air during house cleaning activities. Dust mites do not bite and do not cause harm to humans, other  than by triggering allergies/asthma.  Ways to decrease your exposure to dust mites in your home:  1. Encase mattresses, box springs and pillows with a mite-impermeable barrier or cover  2. Wash sheets, blankets and drapes weekly in hot water (130 F) with detergent and dry them in a dryer on the hot setting.  3. Have the room cleaned frequently with a vacuum cleaner and a damp dust-mop. For carpeting or rugs, vacuuming with a vacuum cleaner equipped with a high-efficiency particulate air (HEPA) filter. The dust mite allergic individual should not be in a room which is being cleaned and should wait 1 hour after cleaning before going into the room.  4. Do not sleep on upholstered furniture (eg, couches).  5. If possible removing carpeting, upholstered furniture and drapery from the home is ideal. Horizontal blinds should be eliminated in the rooms where the person spends the most time (bedroom, study, television room). Washable vinyl, roller-type shades are optimal.  6. Remove all non-washable stuffed toys from the bedroom. Wash stuffed toys weekly like sheets and blankets above.  7. Reduce indoor humidity to less than 50%. Inexpensive humidity monitors can be purchased at most hardware stores. Do not use a humidifier as can make the problem worse and are not recommended.  Control of Dog or Cat Allergen Avoidance is the best way to manage a dog or cat allergy. If you have a dog or cat and are allergic to dog or cats, consider removing the dog or cat from the home. If you have a dog or cat but don't want to find it a new home, or if your family wants a pet even though someone in the household is allergic, here are some strategies that may help keep symptoms at bay:  Keep the pet out of your bedroom and restrict it to only a few rooms. Be advised that keeping the dog or cat in only one room will not limit the allergens to that room. Don't pet, hug or kiss the dog or cat; if you do, wash your hands  with soap and water. High-efficiency particulate air (HEPA) cleaners run continuously in a bedroom or living room can reduce allergen levels over time. Regular use of a high-efficiency vacuum cleaner or a central vacuum can reduce allergen levels. Giving your dog or cat a bath at least once a week can reduce airborne allergen.  Control of Cockroach Allergen Cockroach allergen has been identified as an important cause of acute attacks of asthma, especially in urban settings.  There are fifty-five species of cockroach that exist in the [place] , however only three, the [place], [place] and [place] species produce allergen that can affect patients with Asthma.  Allergens can be obtained from fecal particles, egg casings and secretions from cockroaches.    Remove food sources. Reduce access to water. Seal access and entry points. Spray runways with 0.5-1% Diazinon or Chlorpyrifos Blow boric acid power under stoves and refrigerator. Place bait stations (hydramethylnon) at feeding sites.

## 2024-04-23 NOTE — Progress Notes (Signed)
 [address] [place] [address] Dept: [phone]  FOLLOW UP NOTE  Patient ID: Amy Molina, female    DOB: [birth year]  Age: 63 y.o. MRN: [id number] Date of Office Visit: 04/24/2024  Assessment  Chief Complaint: Follow-up  HPI Amy Molina is a 63 year old female clinic for a follow-up visit.  She was last seen in this clinic on 02/21/2024 by Arlean Mutter, FNP, for evaluation of asthma with acute exacerbation requiring prednisone , allergic rhinitis, and history of DVT on Eliquis  and following up with oncology.  In the interim, she presented to the ED on 04/15/2024 for dizziness and headache.    At today's visit, she reports her asthma has been much more well-controlled with symptoms including occasional shortness of breath activity and infrequent dry cough.  She denies shortness of breath or wheeze with rest. She reports the symptoms are worse during times when heat and humidity is high.  She continues Breztri  2 puffs twice a day with a spacer and uses albuterol  about once a week.  She continues Fasenra  injections with no large or local reactions.  She reports a significant decrease in her symptoms of asthma while continuing on Fasenra .  Allergic rhinitis is reported as much more well-controlled with no symptoms including rhinorrhea, nasal congestion, sneezing, or postnasal drainage.  She does report that she occasionally experiences intermittent voice hoarseness at work.  She continues cetirizine  10 mg once a day and occasionally uses Flonase and nasal saline rinses. Her last environmental allergy skin testing on 04/05/2023 was positive to grass pollen, tree pollen, mold, dust mite, cat, dog, and cockroach.  Her current medications are listed in the chart.   Chart review: EXAM: CT ANGIOGRAPHY HEAD AND NECK WITH AND WITHOUT CONTRAST   TECHNIQUE: Multidetector CT imaging of the head and neck was performed using the standard protocol during bolus administration of  intravenous contrast. Multiplanar CT image reconstructions and MIPs were obtained to evaluate the vascular anatomy. Carotid stenosis measurements (when applicable) are obtained utilizing NASCET criteria, using the distal internal carotid diameter as the denominator.   RADIATION DOSE REDUCTION: This exam was performed according to the departmental dose-optimization program which includes automated exposure control, adjustment of the mA and/or kV according to patient size and/or use of iterative reconstruction technique.   CONTRAST:  OMNIPAQUE  IOHEXOL  350 MG/ML SOLN   COMPARISON:  Brain MRI 03/04/2023.  Head CT 03/05/2023.   FINDINGS: CT HEAD   Brain: Patchy chronic left cerebellum PICA and/or AICA territory infarcts appear unchanged (series 4, image 8).   Normal background brain volume.  Cavum paired a, normal variant.   No midline shift, ventriculomegaly, mass effect, evidence of mass lesion, intracranial hemorrhage or evidence of cortically based acute infarction. Stable gray-white matter differentiation throughout the brain.   Calvarium and skull base: Stable and intact.   Paranasal sinuses: Mild to moderate bilateral paranasal sinus mucosal thickening is increased from last year, more pronounced on the right. No sinus fluid. Tympanic cavities and mastoids remain well aerated.   Orbits: Stable orbit and scalp soft tissues.   CTA NECK   Skeleton: Cervical and visible upper thoracic disc and endplate degeneration. No acute osseous abnormality identified.   Upper chest: Mild upper lung atelectasis. Maximal right paratracheal lymph nodes, nonspecific but might be reactive.   Other neck: Partially retropharyngeal carotids, especially the right. Otherwise nonvascular neck soft tissues are within normal limits.   Aortic arch: Calcified aortic atherosclerosis.  3 vessel arch.   Right carotid system:  Brachiocephalic artery and proximal right CCA mildly tortuous.  Retropharyngeal right carotid and bifurcation with mild soft plaque. Highly tortuous right ICA distal to the bulb. No stenosis.   Left carotid system: Negative left CCA. Mild soft plaque at the left carotid bifurcation. Highly tortuous left ICA similar to the right side. No stenosis to the skull base.   Vertebral arteries: Tortuous proximal right subclavian artery without plaque or stenosis. Mild calcified plaque at the right vertebral artery origin without stenosis. Patent right vertebral artery to the skull base with no significant atherosclerosis or stenosis.   Proximal left subclavian artery and left vertebral artery origin appear normal. Codominant left vertebral artery is patent to the skull base with no significant plaque or stenosis.   CTA HEAD   Posterior circulation: Distal vertebral arteries and vertebrobasilar junction appear patent without stenosis. The left V4 segment appears mildly dominant. Normal right PICA origin. Left AICA appears dominant and patent. No distal vertebral stenosis. Patent basilar artery without stenosis. Patent SCA and PCA origins. Posterior communicating arteries are diminutive or absent. Bilateral PCA branches are within normal limits.   Anterior circulation: Both ICA siphons are patent. Left siphon mild cavernous segment calcified plaque without stenosis. Similar right siphon mild distal cavernous and proximal supraclinoid calcified plaque without stenosis. Patent carotid termini, MCA and ACA origins. Diminutive or absent anterior communicating artery. Bilateral ACA branches are within normal limits. MCA M1 segments are patent, the right bifurcates early without stenosis. Bilateral MCA branches are within normal limits.   Venous sinuses: Patent. Dominant right transverse and sigmoid venous sinus.   Anatomic variants: Mildly dominant left vertebral artery, right transverse and sigmoid venous sinus.   Review of the MIP images confirms the  above findings   IMPRESSION: 1. Negative for large vessel occlusion. 2. Arterial tortuosity but mild for age atherosclerosis in the head and neck. No arterial stenosis. 3. 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 No acute intracranial abnormality. 4.  Aortic Atherosclerosis (ICD10-I70.0).     Electronically Signed   By: VEAR Hurst M.D.   On: 04/15/2024 08:46   Drug Allergies:  Allergies  Allergen Reactions   Penicillins Anaphylaxis, Shortness Of Breath and Swelling    DID THE REACTION INVOLVE: Swelling of the face/tongue/throat, SOB, or low BP? Yes Sudden or severe rash/hives, skin peeling, or the inside of the mouth or nose? No Did it require medical treatment? Yes When did it last happen?      15+ years If all above answers are "NO", may proceed with cephalosporin use.  PATIENT TOLERATED ZOSYN  AND MERREM  ON 07/21/17 ADMISSION    Floxin [Ofloxacin]     MIGRAINES, INSOMNIA, PARANOIND, RINGING IN EARS   Sulfa  Antibiotics Hives   Lipitor [Atorvastatin]     Severe myalgias   Penicillin G Benzathine Rash    Physical Exam: BP 122/70   Pulse 84   Temp 98.1 F (36.7 C)   Resp 14   Wt (!) 301 lb (136.5 kg)   LMP 08/09/2016   SpO2 95%   BMI 50.09 kg/m    Physical Exam Vitals reviewed.  Constitutional:      Appearance: Normal appearance.  HENT:     Head: Normocephalic and atraumatic.     Right Ear: Tympanic membrane normal.     Left Ear: Tympanic membrane normal.     Nose:     Comments: Bilateral nares slightly erythematous with thin clear nasal drainage. Ears normal. Eyes normal. Pharynx normal.    Mouth/Throat:  Pharynx: Oropharynx is clear.  Eyes:     Conjunctiva/sclera: Conjunctivae normal.  Cardiovascular:     Rate and Rhythm: Normal rate and regular rhythm.     Heart sounds: Normal heart sounds. No murmur heard. Pulmonary:     Effort: Pulmonary effort is normal.     Breath sounds: Normal breath sounds.     Comments: Lungs clear to  auscultation Musculoskeletal:        General: Normal range of motion.     Cervical back: Normal range of motion and neck supple.  Skin:    General: Skin is warm and dry.  Neurological:     Mental Status: She is alert and oriented to person, place, and time.  Psychiatric:        Mood and Affect: Mood normal.        Behavior: Behavior normal.        Thought Content: Thought content normal.        Judgment: Judgment normal.     Diagnostics: FVC 1.93 which is 68% of predicted value, FEV1 1.58 which is 71% of predicted value.  Spirometry indicates possible restriction.  This is consistent with previous spirometry readings  Assessment and Plan: 1. Severe persistent asthma without complication   2. Seasonal and perennial allergic rhinitis   3. History of DVT (deep vein thrombosis)   4. Intractable headache, unspecified chronicity pattern, unspecified headache type   5. Dizziness     Meds ordered this encounter  Medications   BREZTRI  AEROSPHERE 160-9-4.8 MCG/ACT AERO inhaler    Sig: Inhale 2 puffs into the lungs in the morning and at bedtime.    Dispense:  33 g    Refill:  1   albuterol  (VENTOLIN  HFA) 108 (90 Base) MCG/ACT inhaler    Sig: Inhale 2 puffs into the lungs every 4 (four) hours as needed for wheezing or shortness of breath.    Dispense:  18 g    Refill:  1    Patient Instructions  Asthma Continue Breztri -2 puffs twice a day with a spacer to prevent cough or wheeze Continue albuterol  2 puffs once every 4 hours if needed for cough or wheeze You may use albuterol  2 puffs 5 to 15 minutes before activity to decrease cough or wheeze Continue Fasenra  injections once  every 8 weeks for asthma control  Allergic rhinitis Continue allergen avoidance measures directed toward grass pollen, tree pollen, mold, dust mite, cat, dog, and cockroach as listed below Continue cetirizine  10 mg once a day as needed for runny nose or itch Continue saline nasal rinses as needed for nasal  symptoms. Use this before any medicated nasal sprays for best result Continue Mucinex 1200 mg twice a day and increase fluid intake as tolerated to thin out mucus Consider allergen immunotherapy if your symptoms are not well-controlled with the treatment plan as listed above  History of DVT Continue Eliquis  as prescribed Continue follow-up with your oncologist as recommended  Dizziness and headache Follow-up with your neurologist as recommended  Call the clinic if this treatment plan is not working well for you.  Follow up in 6 months or sooner if needed.   Return in about 6 months (around 10/25/2024), or if symptoms worsen or fail to improve.    Thank you for the opportunity to care for this patient.  Please do not hesitate to contact me with questions.  Arlean Mutter, FNP [facility] 

## 2024-04-24 ENCOUNTER — Encounter: Payer: Self-pay | Admitting: Family Medicine

## 2024-04-24 ENCOUNTER — Ambulatory Visit: Admitting: Family Medicine

## 2024-04-24 VITALS — BP 122/70 | HR 84 | Temp 98.1°F | Resp 14 | Wt 301.0 lb

## 2024-04-24 DIAGNOSIS — R519 Headache, unspecified: Secondary | ICD-10-CM | POA: Insufficient documentation

## 2024-04-24 DIAGNOSIS — J302 Other seasonal allergic rhinitis: Secondary | ICD-10-CM

## 2024-04-24 DIAGNOSIS — J3089 Other allergic rhinitis: Secondary | ICD-10-CM | POA: Diagnosis not present

## 2024-04-24 DIAGNOSIS — Z86718 Personal history of other venous thrombosis and embolism: Secondary | ICD-10-CM

## 2024-04-24 DIAGNOSIS — J4550 Severe persistent asthma, uncomplicated: Secondary | ICD-10-CM | POA: Diagnosis not present

## 2024-04-24 DIAGNOSIS — R42 Dizziness and giddiness: Secondary | ICD-10-CM | POA: Insufficient documentation

## 2024-04-24 MED ORDER — ALBUTEROL SULFATE HFA 108 (90 BASE) MCG/ACT IN AERS: 2.0000 | INHALATION_SPRAY | RESPIRATORY_TRACT | 1 refills | Status: AC | PRN

## 2024-04-24 MED ORDER — BREZTRI AEROSPHERE 160-9-4.8 MCG/ACT IN AERO: 2.0000 | INHALATION_SPRAY | Freq: Two times a day (BID) | RESPIRATORY_TRACT | 1 refills | Status: AC

## 2024-05-20 ENCOUNTER — Ambulatory Visit

## 2024-05-22 ENCOUNTER — Ambulatory Visit

## 2024-05-22 DIAGNOSIS — J4550 Severe persistent asthma, uncomplicated: Secondary | ICD-10-CM | POA: Diagnosis not present

## 2024-06-21 ENCOUNTER — Ambulatory Visit: Payer: 59 | Admitting: Physician Assistant

## 2024-06-21 ENCOUNTER — Encounter: Payer: Self-pay | Admitting: Physician Assistant

## 2024-06-21 VITALS — BP 166/92 | HR 79 | Resp 20 | Ht 65.0 in | Wt 301.0 lb

## 2024-06-21 DIAGNOSIS — R413 Other amnesia: Secondary | ICD-10-CM | POA: Diagnosis not present
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
## 2024-06-21 NOTE — Patient Instructions (Signed)
 It was a pleasure to see you today at our office.   Recommendations:    Follow up in 6 months Recommend sleep studies Recommend counseling Increase exercise and socialization      Whom to call: Memory  decline, memory medications: Call our office [phone]    [email or web address]   RECOMMENDATIONS FOR ALL PATIENTS WITH MEMORY PROBLEMS: 1. Continue to exercise (Recommend 30 minutes of walking everyday, or 3 hours every week) 2. Increase social interactions - continue going to [place] and enjoy social gatherings with friends and family 3. Eat healthy, avoid fried foods and eat more fruits and vegetables 4. Maintain adequate blood pressure, blood sugar, and blood cholesterol level. Reducing the risk of stroke and cardiovascular disease also helps promoting better memory. 5. Avoid stressful situations. Live a simple life and avoid aggravations. Organize your time and prepare for the next day in anticipation. 6. Sleep well, avoid any interruptions of sleep and avoid any distractions in the bedroom that may interfere with adequate sleep quality 7. Avoid sugar, avoid sweets as there is a strong link between excessive sugar intake, diabetes, and cognitive impairment We discussed the Mediterranean diet, which has been shown to help patients reduce the risk of progressive memory disorders and reduces cardiovascular risk. This includes eating fish, eat fruits and green leafy vegetables, nuts like almonds and hazelnuts, walnuts, and also use olive oil. Avoid fast foods and fried foods as much as possible. Avoid sweets and sugar as sugar use has been linked to worsening of memory function.  There is always a concern of gradual progression of memory problems. If this is the case, then we may need to adjust level of care according to patient needs. Support, both to the patient and caregiver, should then be put into place.        FALL  PRECAUTIONS: Be cautious when walking. Scan the area for obstacles that may increase the risk of trips and falls. When getting up in the mornings, sit up at the edge of the bed for a few minutes before getting out of bed. Consider elevating the bed at the head end to avoid drop of blood pressure when getting up. Walk always in a well-lit room (use night lights in the walls). Avoid area rugs or power cords from appliances in the middle of the walkways. Use a walker or a cane if necessary and consider physical therapy for balance exercise. Get your eyesight checked regularly.  FINANCIAL OVERSIGHT: Supervision, especially oversight when making financial decisions or transactions is also recommended.  HOME SAFETY: Consider the safety of the kitchen when operating appliances like stoves, microwave oven, and blender. Consider having supervision and share cooking responsibilities until no longer able to participate in those. Accidents with firearms and other hazards in the house should be identified and addressed as well.   ABILITY TO BE LEFT ALONE: If patient is unable to contact 911 operator, consider using LifeLine, or when the need is there, arrange for someone to stay with patients. Smoking is a fire hazard, consider supervision or cessation. Risk of wandering should be assessed by caregiver and if detected at any point, supervision and safe proof recommendations should be instituted.  MEDICATION SUPERVISION: Inability to self-administer medication needs to be constantly addressed. Implement a mechanism to ensure safe administration of the medications.      Mediterranean Diet A Mediterranean diet refers to food and lifestyle choices that are based on the traditions of countries located on the [employer]. This way of eating has  been shown to help prevent certain conditions and improve outcomes for people who have chronic diseases, like kidney disease and heart disease. What are tips for  following this plan? Lifestyle  Cook and eat meals together with your family, when possible. Drink enough fluid to keep your urine clear or pale yellow. Be physically active every day. This includes: Aerobic exercise like running or swimming. Leisure activities like gardening, walking, or housework. Get 7-8 hours of sleep each night. If recommended by your health care provider, drink red wine in moderation. This means 1 glass a day for nonpregnant women and 2 glasses a day for men. A glass of wine equals 5 oz (150 mL). Reading food labels  Check the serving size of packaged foods. For foods such as rice and pasta, the serving size refers to the amount of cooked product, not dry. Check the total fat in packaged foods. Avoid foods that have saturated fat or trans fats. Check the ingredients list for added sugars, such as corn syrup. Shopping  At the grocery store, buy most of your food from the areas near the walls of the store. This includes: Fresh fruits and vegetables (produce). Grains, beans, nuts, and seeds. Some of these may be available in unpackaged forms or large amounts (in bulk). Fresh seafood. Poultry and eggs. Low-fat dairy products. Buy whole ingredients instead of prepackaged foods. Buy fresh fruits and vegetables in-season from local farmers markets. Buy frozen fruits and vegetables in resealable bags. If you do not have access to quality fresh seafood, buy precooked frozen shrimp or canned fish, such as tuna, salmon, or sardines. Buy small amounts of raw or cooked vegetables, salads, or olives from the deli or salad bar at your store. Stock your pantry so you always have certain foods on hand, such as olive oil, canned tuna, canned tomatoes, rice, pasta, and beans. Cooking  Cook foods with extra-virgin olive oil instead of using butter or other vegetable oils. Have meat as a side dish, and have vegetables or grains as your main dish. This means having meat in small portions  or adding small amounts of meat to foods like pasta or stew. Use beans or vegetables instead of meat in common dishes like chili or lasagna. Experiment with different cooking methods. Try roasting or broiling vegetables instead of steaming or sauteing them. Add frozen vegetables to soups, stews, pasta, or rice. Add nuts or seeds for added healthy fat at each meal. You can add these to yogurt, salads, or vegetable dishes. Marinate fish or vegetables using olive oil, lemon juice, garlic, and fresh herbs. Meal planning  Plan to eat 1 vegetarian meal one day each week. Try to work up to 2 vegetarian meals, if possible. Eat seafood 2 or more times a week. Have healthy snacks readily available, such as: Vegetable sticks with hummus. Greek yogurt. Fruit and nut trail mix. Eat balanced meals throughout the week. This includes: Fruit: 2-3 servings a day Vegetables: 4-5 servings a day Low-fat dairy: 2 servings a day Fish, poultry, or lean meat: 1 serving a day Beans and legumes: 2 or more servings a week Nuts and seeds: 1-2 servings a day Whole grains: 6-8 servings a day Extra-virgin olive oil: 3-4 servings a day Limit red meat and sweets to only a few servings a month What are my food choices? Mediterranean diet Recommended Grains: Whole-grain pasta. Brown rice. Bulgar wheat. Polenta. Couscous. Whole-wheat bread. [place] Made[place]. Vegetables: Artichokes. Beets. Broccoli. Cabbage. Carrots. Eggplant. Green beans. Chard. Kale. Spinach. Onions.  Leeks. Peas. Squash. Tomatoes. Peppers. Radishes. Fruits: Apples. Apricots. Avocado. Berries. Bananas. Cherries. Dates. Figs. Grapes. Lemons. Melon. Oranges. Peaches. Plums. Pomegranate. Meats and other protein foods: Beans. Almonds. Sunflower seeds. Pine nuts. Peanuts. Cod. Salmon. Scallops. Shrimp. Tuna. Tilapia. Clams. Oysters. Eggs. Dairy: Low-fat milk. Cheese. Greek yogurt. Beverages: Water. Red wine. Herbal tea. Fats and oils: Extra virgin olive oil.  Avocado oil. Grape seed oil. Sweets and desserts: [place] yogurt with honey. Baked apples. Poached pears. Trail mix. Seasoning and other foods: Basil. Cilantro. Coriander. Cumin. Mint. Parsley. Sage. Rosemary. Tarragon. Garlic. Oregano. Thyme. Pepper. Balsalmic vinegar. Tahini. Hummus. Tomato sauce. Olives. Mushrooms. Limit these Grains: Prepackaged pasta or rice dishes. Prepackaged cereal with added sugar. Vegetables: Deep fried potatoes (french fries). Fruits: Fruit canned in syrup. Meats and other protein foods: Beef. Pork. Lamb. Poultry with skin. Hot dogs. [place]. Dairy: Ice cream. Sour cream. Whole milk. Beverages: Juice. Sugar-sweetened soft drinks. Beer. Liquor and spirits. Fats and oils: Butter. Canola oil. Vegetable oil. Beef fat (tallow). Lard. Sweets and desserts: Cookies. Cakes. Pies. Candy. Seasoning and other foods: Mayonnaise. Premade sauces and marinades. The items listed may not be a complete list. Talk with your dietitian about what dietary choices are right for you. Summary The Mediterranean diet includes both food and lifestyle choices. Eat a variety of fresh fruits and vegetables, beans, nuts, seeds, and whole grains. Limit the amount of red meat and sweets that you eat. Talk with your health care provider about whether it is safe for you to drink red wine in moderation. This means 1 glass a day for nonpregnant women and 2 glasses a day for men. A glass of wine equals 5 oz (150 mL). This information is not intended to replace advice given to you by your health care provider. Make sure you discuss any questions you have with your health care provider. Document Released: 04/21/2016 Document Revised: 05/24/2016 Document Reviewed: 04/21/2016 Elsevier Interactive Patient Education  2017 [employer].     For migraines  Limit use of pain relievers to no more than 2 days out of the week.  These medications include acetaminophen , NSAIDs (ibuprofen/Advil/Motrin, naproxen/Aleve,  triptans (Imitrex/sumatriptan), Excedrin, and narcotics.  This will help reduce risk of rebound headaches. Be aware of common food triggers:  - Caffeine:  coffee, black tea, cola, Mt. Dew  - Chocolate  - Dairy:  aged cheeses (brie, blue, cheddar, gouda, Parmasan, provolone, romano, Swiss, etc), chocolate milk, buttermilk, sour cream, limit eggs and yogurt  - Nuts, peanut butter  - Alcohol  - Cereals/grains:  FRESH breads (fresh bagels, sourdough, doughnuts), yeast productions  - Processed/canned/aged/cured meats (pre-packaged deli meats, hotdogs)  - MSG/glutamate:  soy sauce, flavor enhancer, pickled/preserved/marinated foods  - Sweeteners:  aspartame (Equal, Nutrasweet).  Sugar and Splenda are okay  - Vegetables:  legumes (lima beans, lentils, snow peas, fava beans, pinto peans, peas, garbanzo beans), sauerkraut, onions, olives, pickles  - Fruit:  avocados, bananas, citrus fruit (orange, lemon, grapefruit), mango  - Other:  Frozen meals, macaroni and cheese Routine exercise Stay adequately hydrated (aim for 64 oz water daily) Keep headache diary Maintain proper stress management Maintain proper sleep hygiene Do not skip meals Consider supplements:  magnesium  citrate 400mg  daily, riboflavin 400mg  daily, coenzyme Q10 100mg  three times daily.

## 2024-07-15 ENCOUNTER — Ambulatory Visit

## 2024-07-16 ENCOUNTER — Inpatient Hospital Stay: Payer: 59 | Attending: Physician Assistant

## 2024-07-16 DIAGNOSIS — I824Y2 Acute embolism and thrombosis of unspecified deep veins of left proximal lower extremity: Secondary | ICD-10-CM | POA: Diagnosis present

## 2024-07-16 DIAGNOSIS — Z7901 Long term (current) use of anticoagulants: Secondary | ICD-10-CM | POA: Diagnosis present

## 2024-07-16 LAB — CBC WITH DIFFERENTIAL/PLATELET
Abs Immature Granulocytes: 0.09 K/uL — ABNORMAL HIGH (ref 0.00–0.07)
Basophils Absolute: 0 K/uL (ref 0.0–0.1)
Basophils Relative: 0 %
Eosinophils Absolute: 0 K/uL (ref 0.0–0.5)
Eosinophils Relative: 0 %
HCT: 41.4 % (ref 36.0–46.0)
Hemoglobin: 13.2 g/dL (ref 12.0–15.0)
Immature Granulocytes: 1 %
Lymphocytes Relative: 41 %
Lymphs Abs: 4.7 K/uL — ABNORMAL HIGH (ref 0.7–4.0)
MCH: 30.6 pg (ref 26.0–34.0)
MCHC: 31.9 g/dL (ref 30.0–36.0)
MCV: 95.8 fL (ref 80.0–100.0)
Monocytes Absolute: 1.2 K/uL — ABNORMAL HIGH (ref 0.1–1.0)
Monocytes Relative: 10 %
Neutro Abs: 5.6 K/uL (ref 1.7–7.7)
Neutrophils Relative %: 48 %
Platelets: 268 K/uL (ref 150–400)
RBC: 4.32 MIL/uL (ref 3.87–5.11)
RDW: 13.2 % (ref 11.5–15.5)
WBC: 11.6 K/uL — ABNORMAL HIGH (ref 4.0–10.5)
nRBC: 0 % (ref 0.0–0.2)

## 2024-07-16 LAB — COMPREHENSIVE METABOLIC PANEL WITH GFR
ALT: 15 U/L (ref 0–44)
AST: 21 U/L (ref 15–41)
Albumin: 4 g/dL (ref 3.5–5.0)
Alkaline Phosphatase: 89 U/L (ref 38–126)
Anion gap: 10 (ref 5–15)
BUN: 18 mg/dL (ref 6–20)
CO2: 26 mmol/L (ref 22–32)
Calcium: 8.9 mg/dL (ref 8.9–10.3)
Chloride: 105 mmol/L (ref 98–111)
Creatinine, Ser: 0.67 mg/dL (ref 0.44–1.00)
GFR, Estimated: >60 mL/min (ref >60)
Glucose, Bld: 131 mg/dL — ABNORMAL HIGH (ref 70–99)
Potassium: 4.1 mmol/L (ref 3.5–5.1)
Sodium: 141 mmol/L (ref 135–145)
Total Bilirubin: 0.3 mg/dL (ref 0.0–1.2)
Total Protein: 7.4 g/dL (ref 6.5–8.1)

## 2024-07-16 LAB — D-DIMER, QUANTITATIVE: D-Dimer, Quant: 0.87 ug{FEU}/mL — ABNORMAL HIGH (ref 0.00–0.50)

## 2024-07-17 ENCOUNTER — Encounter: Payer: Self-pay | Admitting: Emergency Medicine

## 2024-07-17 ENCOUNTER — Ambulatory Visit
Admission: EM | Admit: 2024-07-17 | Discharge: 2024-07-17 | Disposition: A | Discharge status: Home / Self Care | Attending: Family Medicine | Admitting: Family Medicine

## 2024-07-17 DIAGNOSIS — M25562 Pain in left knee: Secondary | ICD-10-CM

## 2024-07-17 MED ORDER — DICLOFENAC SODIUM 1 % EX GEL: 2.0000 g | Freq: Four times a day (QID) | CUTANEOUS | 2 refills | Status: AC | PRN

## 2024-07-17 MED ORDER — PREDNISONE 20 MG PO TABS: 20.0000 mg | ORAL_TABLET | Freq: Every day | ORAL | 0 refills | Status: AC

## 2024-07-17 NOTE — ED Provider Notes (Signed)
 [facility]    CSN: [id number] Arrival date & time: 07/17/24  1515      History   Chief Complaint No chief complaint on file.   HPI Amy Molina is a 63 y.o. female.   Patient presenting today with several day history of significant left knee pain, stiffness following doing a lot of of going up and down steps the day before.  Denies discoloration, numbness, tingling, weakness, falls or other injuries to the area.  So far trying Aleve with minimal relief.    Past Medical History:  Diagnosis Date   Anxiety    Arthritis    general stiffness of joints   Asthma    Complication of anesthesia    per pt,hard to wake up past some sedation!   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   Lower extremity cellulitis 03/04/2023   Altered mental state 03/04/2023    Class: Acute   DVT (deep venous thrombosis) (HCC) 03/04/2023    Class: Acute   Hyperlipidemia 03/04/2023   Hypokalemia 03/04/2023   SIRS (systemic inflammatory response syndrome) (HCC) 03/04/2023   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, 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   Venous stasis dermatitis of both lower extremities 08/15/2022   Seasonal and perennial allergic rhinitis 04/01/2022   Severe persistent asthma without complication (HCC) 04/01/2022   Chronic rhinitis 06/02/2021   Moderate persistent asthma, uncomplicated 06/02/2021   Anxiety 06/17/2020   Asthmatic bronchitis , chronic (HCC)  06/17/2020   Gastroesophageal reflux disease 04/29/2020   Abnormal CT scan, sigmoid colon    Benign neoplasm of ascending colon    [name]    Tubo-ovarian abscess 07/21/2017   Uncontrolled type 2 diabetes mellitus with hyperglycemia (HCC) 07/21/2017   Benign neoplasm of rectum    Fibroid uterus 10/22/2014   Hypertension 03/19/2012   Diabetes mellitus, type 2 (HCC) 03/19/2012   Obesity, morbid (HCC) 03/19/2012    Past Surgical History:  Procedure Laterality Date   COLONOSCOPY N/A 08/04/2015   Procedure: COLONOSCOPY;  Surgeon: Gordy CHRISTELLA Starch, MD;  Location: [facility];  Service: Gastroenterology;  Laterality: N/A;   COLONOSCOPY     COLONOSCOPY WITH PROPOFOL  N/A 10/22/2018   Procedure: COLONOSCOPY WITH PROPOFOL ;  Surgeon: Starch Gordy CHRISTELLA, MD;  Location: [facility];  Service: Gastroenterology;  Laterality: N/A;   OOPHORECTOMY  05-2000   REMOVED LEFT TUBE AND OVARY   POLYPECTOMY  10/22/2018   Procedure: POLYPECTOMY;  Surgeon: Starch Gordy CHRISTELLA, MD;  Location: [facility];  Service: Gastroenterology;;   WISDOM TOOTH EXTRACTION      OB History     Gravida  1   Para      Term      Preterm      AB  1   Living  0      SAB  1   IAB      Ectopic  Multiple      Live Births               Home Medications    Prior to Admission medications   Medication Sig Start Date End Date Taking? Authorizing Provider  diclofenac Sodium (VOLTAREN ARTHRITIS PAIN) 1 % GEL Apply 2 g topically 4 (four) times daily as needed. 07/17/24  Yes Stuart Vernell Norris, PA-C  predniSONE  (DELTASONE ) 20 MG tablet Take 1 tablet (20 mg total) by mouth daily with breakfast. 07/17/24  Yes Stuart Vernell Norris, PA-C  acetaminophen  (TYLENOL ) 500 MG tablet Take 1,000 mg by mouth 2 (two) times daily as needed for moderate pain.    [provider]  albuterol  (PROVENTIL ) (2.5 MG/3ML) 0.083% nebulizer solution Take 3 mLs (2.5 mg total) by nebulization every 6 (six) hours as  needed for wheezing or shortness of breath. 11/06/23   Tobie Arleta SQUIBB, MD  albuterol  (VENTOLIN  HFA) 108 (90 Base) MCG/ACT inhaler Inhale 2 puffs into the lungs every 4 (four) hours as needed for wheezing or shortness of breath. 04/24/24   Cari Arlean HERO, FNP  ALPRAZolam  (XANAX ) 0.5 MG tablet Take 1 tablet (0.5 mg total) by mouth 3 (three) times daily as needed for sleep or anxiety. 12/26/17   Jayne Vonn DEL, MD  apixaban  (ELIQUIS ) 5 MG TABS tablet Take 1 tablet (5 mg total) by mouth 2 (two) times daily. 07/18/23   Lamon Pleasant HERO, PA-C  BREZTRI  AEROSPHERE 160-9-4.8 MCG/ACT AERO inhaler Inhale 2 puffs into the lungs in the morning and at bedtime. 04/24/24   Cari Arlean HERO, FNP  cetirizine  (ZYRTEC ) 10 MG tablet Take 1 tablet (10 mg total) by mouth daily. 11/06/23   Tobie Arleta SQUIBB, MD  Doxepin HCl 5 % CREA Apply 1 Application topically daily at 6 (six) AM.    [provider]  escitalopram  (LEXAPRO ) 20 MG tablet Take 1 tablet by mouth once daily 11/29/20   Jayne Vonn DEL, MD  FASENRA  PEN 30 MG/ML prefilled autoinjector INJECT 1 PEN SUBCUTANEOUSLY  EVERY 8 WEEKS 11/02/23   Cheryl Reusing, FNP  furosemide  (LASIX ) 20 MG tablet Take 20 mg by mouth daily. 04/29/20   [provider]  loperamide (IMODIUM A-D) 2 MG tablet Take 2 mg by mouth daily as needed for diarrhea or loose stools.    [provider]  loratadine  (CLARITIN ) 10 MG tablet Take 10 mg by mouth daily.    [provider]  losartan  (COZAAR ) 50 MG tablet Take 1 tablet (50 mg total) by mouth daily. 11/27/13   Duanne Butler DASEN, MD  meclizine  (ANTIVERT ) 25 MG tablet Take 1 tablet (25 mg total) by mouth 3 (three) times daily as needed for dizziness. 04/15/24   Simon Lavonia SAILOR, MD  metFORMIN  (GLUCOPHAGE ) 1000 MG tablet Take 1 tablet (1,000 mg total) by mouth daily with breakfast. 11/27/13   Duanne Butler DASEN, MD  MOUNJARO 2.5 MG/0.5ML Pen SMARTSIG:0.5 Milliliter(s) SUB-Q Once a Week 07/02/23   [provider]  omeprazole   (PRILOSEC) 20 MG capsule Take 1 capsule (20 mg total) by mouth daily. 04/05/23 06/21/24  Iva Marty Saltness, MD  Spacer/Aero-Holding Chambers (AEROCHAMBER PLUS FLO-VU LARGE) MISC Use as directed with metered dose inhaler. 06/02/21   Iva Marty Saltness, MD  Tetrahydrozoline HCl (VISINE OP) Place 1 drop into both eyes daily as needed (dry eyes).    [provider]    Family History Family History  Problem Relation Age of Onset   Eczema Mother    Heart disease Mother  Heart attack Mother    Breast cancer Mother    Hypertension Father    Heart disease Father    Colon polyps Father    COPD Father    Kidney cancer Father    Asthma Sister    Allergic rhinitis Sister    Diabetes Sister    Diabetes Maternal Aunt        7 mat aunts diabetes   Hypertension Maternal Aunt    Lung disease Maternal Aunt    Hypertension Paternal Aunt    Breast cancer Paternal Aunt    Colon cancer Neg Hx     Social History Social History   Tobacco Use   Smoking status: Never   Smokeless tobacco: Never  Vaping Use   Vaping status: Never Used  Substance Use Topics   Alcohol use: Yes    Alcohol/week: 0.0 standard drinks of alcohol    Comment: RARELY   Drug use: No     Allergies   Penicillins, Floxin [ofloxacin], Sulfa  antibiotics, Lipitor [atorvastatin], and Penicillin g benzathine   Review of Systems Review of Systems Per HPI  Physical Exam Triage Vital Signs ED Triage Vitals  Encounter Vitals Group     BP 07/17/24 1538 121/70     Girls Systolic BP Percentile --      Girls Diastolic BP Percentile --      Boys Systolic BP Percentile --      Boys Diastolic BP Percentile --      Pulse Rate 07/17/24 1538 72     Resp 07/17/24 1538 20     Temp 07/17/24 1538 97.7 F (36.5 C)     Temp Source 07/17/24 1538 Oral     SpO2 07/17/24 1538 94 %     Weight --      Height --      Head Circumference --      Peak Flow --      Pain Score 07/17/24 1539 7     Pain Loc --      Pain  Education --      Exclude from Growth Chart --    No data found.  Updated Vital Signs BP 121/70 (BP Location: Right Arm)   Pulse 72   Temp 97.7 F (36.5 C) (Oral)   Resp 20   LMP 08/09/2016   SpO2 94%   Visual Acuity Right Eye Distance:   Left Eye Distance:   Bilateral Distance:    Right Eye Near:   Left Eye Near:    Bilateral Near:     Physical Exam Vitals and nursing note reviewed.  Constitutional:      Appearance: Normal appearance. She is not ill-appearing.  HENT:     Head: Atraumatic.  Eyes:     Extraocular Movements: Extraocular movements intact.     Conjunctiva/sclera: Conjunctivae normal.  Cardiovascular:     Rate and Rhythm: Normal rate.  Pulmonary:     Effort: Pulmonary effort is normal.  Musculoskeletal:        General: Tenderness present. No swelling, deformity or signs of injury. Normal range of motion.     Cervical back: Normal range of motion and neck supple.     Comments: Diffuse mild tenderness to palpation to the left knee, range of motion intact but crepitus present on active and passive range of motion  Skin:    General: Skin is warm and dry.     Findings: No bruising or erythema.  Neurological:     Mental Status: She is  alert and oriented to person, place, and time.  Psychiatric:        Mood and Affect: Mood normal.        Thought Content: Thought content normal.        Judgment: Judgment normal.      UC Treatments / Results  Labs (all labs ordered are listed, but only abnormal results are displayed) Labs Reviewed - No data to display  EKG   Radiology No results found.  Procedures Procedures (including critical care time)  Medications Ordered in UC Medications - No data to display  Initial Impression / Assessment and Plan / UC Course  I have reviewed the triage vital signs and the nursing notes.  Pertinent labs & imaging results that were available during my care of the patient were reviewed by me and considered in my  medical decision making (see chart for details).     X-ray imaging deferred with shared decision making given no new injury or trauma.  Suspect inflammatory, possibly arthritis flare.  Treat with very low-dose of prednisone  with close monitoring of blood sugars which she states have been around 120 recently.  Voltaren gel, RICE protocol, over-the-counter pain relievers.  Final Clinical Impressions(s) / UC Diagnoses   Final diagnoses:  Acute pain of left knee     Discharge Instructions      Rest, ice, compression, elevation, and I have prescribed a short course of prednisone  to help with the pain and inflammation.  Watch your blood sugars closely while on this medication.  You may also apply the Voltaren gel that I have prescribed up to 4 times daily as needed    ED Prescriptions     Medication Sig Dispense Auth. Provider   predniSONE  (DELTASONE ) 20 MG tablet Take 1 tablet (20 mg total) by mouth daily with breakfast. 5 tablet Stuart Vernell Norris, PA-C   diclofenac Sodium (VOLTAREN ARTHRITIS PAIN) 1 % GEL Apply 2 g topically 4 (four) times daily as needed. 150 g Stuart Vernell Norris, [place]      PDMP not reviewed this encounter.   Stuart Vernell Norris, [place] 07/17/24 1628

## 2024-07-17 NOTE — ED Triage Notes (Signed)
 Left knee pain since Tuesday morning.  No known injury.  States she has been going up and down a lot of steps at work.  States pain some times radiates down to ankle.

## 2024-07-17 NOTE — Discharge Instructions (Signed)
 Rest, ice, compression, elevation, and I have prescribed a short course of prednisone  to help with the pain and inflammation.  Watch your blood sugars closely while on this medication.  You may also apply the Voltaren gel that I have prescribed up to 4 times daily as needed

## 2024-07-19 LAB — MISC LABCORP TEST (SEND OUT): Labcorp test code: 504385

## 2024-07-22 NOTE — Progress Notes (Deleted)
 [facility] [address], [place] [address]   CLINIC:  Medical Oncology/Hematology  PCP:  Cristopher Suzen HERO, NP [address] [place] [address] [phone]   REASON FOR VISIT:  Follow-up for unprovoked LLE DVT  CURRENT THERAPY: Eliquis  5 mg twice daily  INTERVAL HISTORY:   Ms. Rapozo 63 y.o. female returns for routine follow-up of her unprovoked LLE DVT.   She was last seen by Pleasant Barefoot PA-C on 07/18/2023.  At today's visit, she reports feeling ***.   ***No recent hospitalizations, surgeries, or changes in baseline health status.  She continues to take Eliquis  5 mg twice daily, but admits to missing doses after her US  showed resolution of blood clot. *** ***She denies any adverse bleeding events since being placed on blood thinner.    ***She has chronic bilateral lower extremity edema/venous stasis, denies any new onset unilateral leg swelling.   ***She denies any chest pain or hemoptysis.   ***She has some shortness of breath related to her asthma and gets winded with exertion.  She has 70***% energy and 90***% appetite.  ***She has lost 30-40 pounds in the past year while taking Mounjaro.   ***Weight at today's visit is stable compared to last visit.  ASSESSMENT & PLAN:  1.  Unprovoked left leg DVT: - Hospitalized 03/04/2023 through 03/08/2023 for acute unprovoked LLE DVT and superimposed left leg cellulitis  Presented with headaches, dizziness, difficulty concentrating, trouble word finding, left leg swelling/redness/tenderness Left leg venous US  Doppler (03/04/2023): Positive DVT involving left femoral, popliteal, and posterior tibial veins. MRI brain without contrast (03/04/2023): Negative for acute infarct.  Chronic left cerebellar infarct. Cellulitis treated with IV cefazolin , discharged with Keflex . - No obvious provoking factors for DVT.  Chronic risk factors for DVT including obesity and venous stasis changes in lower  legs.*** - No prior history of thrombosis.  She had miscarriage x 1 at age 41, in the first trimester. - She has been taking Eliquis  since 03/05/2023.  Tolerating this well without adverse bleeding events.  *** - Repeat US  venous LLE (07/05/2023): Interval resolution of left lower extremity deep venous thrombus - No symptoms of recurrent DVT or PE at this time.  *** - D-dimer (07/05/2023) mildly elevated, but improved at 0.65 - Negative for lupus anticoagulant, cardiolipin antibodies, and beta-2  glycoprotein 1 antibodies. - Due to high BMI of 49, apixaban  anti-Xa trough levels were obtained on 03/14/2023), with levels of 73 ng/mL that were below the 5th-95th percentile range of 91-321 ng/mL.  (Adjusting dose of apixaban  based on low antifactor Xa levels not routinely recommended, as there is no established correlation between these levels and the efficacy or safety of apixaban  in standard clinical settings.) - Most recent labs (07/16/2024): No anemia (Hgb 13.2).   Normal kidney function (creatinine 0.67) next D-dimer mildly elevated, but overall stable at 0.87 (slightly elevated at baseline D-dimer may be related to obesity) Anti-Xa is undetectable at <35 *** Question if she is taking Eliquis  as prescribed??  *** - PLAN: Due to unprovoked nature of DVT (and risk factors for recurrence including obesity and venous stasis changes in lower legs), recommend long-term anticoagulation.*** - Will continue Eliquis  5 mg twice daily.  We discussed relative risks versus benefits of ongoing anticoagulation.  Patient is agreeable to proceed.*** - RTC in 1 year with repeat CBC/D, CMP, D-dimer, and apixaban  anti-Xa trough levels *** Possible discharge to PCP?  ***  2.  Other medical problems: - HTN, DM2, persistent asthma, GERD,  morbid obesity  3.  Social/family history: - Her sister lives with her.  She is independent of ADLs and IADLs.  She works at [employer] on [employer], 12-hour shifts.  She is  up and about on her feet during work.  She is a non-smoker. - No family history of DVT.  Father had kidney cancer.  Mother had breast cancer.  2 maternal aunts had bilateral breast cancer.  Maternal great uncle had stomach cancer.  Another maternal aunt had brain tumor.  Paternal uncle had prostate cancer.  1 maternal aunt had 3 miscarriages.  PLAN SUMMARY: *** TBD *** >> Labs in 1 year (appointment should be first thing in the morning) = CBC/D, CMP, D-dimer, apixaban  anti-Xa trough [Misc. [facility] #[id number]] >> OFFICE visit in 1 year (1 week after labs)     REVIEW OF SYSTEMS: ***  Review of Systems  Constitutional:  Positive for fatigue. Negative for appetite change, chills, diaphoresis, fever and unexpected weight change.  HENT:   Negative for lump/mass and nosebleeds.   Eyes:  Negative for eye problems.  Respiratory:  Positive for shortness of breath (with exertion, asthma). Negative for cough and hemoptysis.   Cardiovascular:  Negative for chest pain, leg swelling and palpitations.  Gastrointestinal:  Positive for diarrhea. Negative for abdominal pain, blood in stool, constipation, nausea and vomiting.  Genitourinary:  Negative for hematuria.   Skin: Negative.   Neurological:  Positive for numbness. Negative for dizziness, headaches and light-headedness.  Hematological:  Does not bruise/bleed easily.  Psychiatric/Behavioral:  Positive for depression. The patient is nervous/anxious.      PHYSICAL EXAM:***  ECOG PERFORMANCE STATUS: 1 - Symptomatic but completely ambulatory  There were no vitals filed for this visit.  There were no vitals filed for this visit.  Physical Exam Constitutional:      Appearance: Normal appearance. She is morbidly obese.  Cardiovascular:     Heart sounds: Normal heart sounds.  Pulmonary:     Breath sounds: Normal breath sounds.  Musculoskeletal:     Right lower leg: Edema (chronic lymphedema w/ venous stasis chnages) present.     Left lower leg: Edema  (chronic lymphedema w/ venous stasis chnages) present.  Neurological:     General: No focal deficit present.     Mental Status: Mental status is at baseline.  Psychiatric:        Behavior: Behavior normal. Behavior is cooperative.     PAST MEDICAL/SURGICAL HISTORY:  Past Medical History:  Diagnosis Date   Anxiety    Arthritis    general stiffness of joints   Asthma    Complication of anesthesia    per pt,hard to wake up past some sedation!   Diabetes mellitus (HCC)    Diarrhea    Heart murmur    childhood years- no mention in adult yrs   History of indigestion    Hypertension    Morbid obesity (HCC)    Recurrent upper respiratory infection (URI)    Seasonal allergies    Spinal stenosis    Past Surgical History:  Procedure Laterality Date   COLONOSCOPY N/A 08/04/2015   Procedure: COLONOSCOPY;  Surgeon: Gordy CHRISTELLA Starch, MD;  Location: [facility];  Service: Gastroenterology;  Laterality: N/A;   COLONOSCOPY     COLONOSCOPY WITH PROPOFOL  N/A 10/22/2018   Procedure: COLONOSCOPY WITH PROPOFOL ;  Surgeon: Starch Gordy CHRISTELLA, MD;  Location: [facility];  Service: Gastroenterology;  Laterality: N/A;   OOPHORECTOMY  05-2000   REMOVED LEFT TUBE AND  OVARY   POLYPECTOMY  10/22/2018   Procedure: POLYPECTOMY;  Surgeon: Albertus Gordy HERO, MD;  Location: [facility];  Service: Gastroenterology;;   WISDOM TOOTH EXTRACTION      SOCIAL HISTORY:  Social History   Socioeconomic History   Marital status: Single    Spouse name: Not on file   Number of children: 1   Years of education: 14   Highest education level: Not on file  Occupational History   Not on file  Tobacco Use   Smoking status: Never   Smokeless tobacco: Never  Vaping Use   Vaping status: Never Used  Substance and Sexual Activity   Alcohol use: Yes    Alcohol/week: 0.0 standard drinks of alcohol    Comment: RARELY   Drug use: No   Sexual activity: Not Currently    Birth control/protection: None  Other Topics Concern   Not  on file  Social History Narrative   Right handed   Drinks caffeine   [employer] works there   Lives brotherin social worker and sister   [place]   Social Drivers of Health   Financial Resource Strain: Not on file  Food Insecurity: Not on file  Transportation Needs: Not on file  Physical Activity: Not on file  Stress: Not on file  Social Connections: Unknown (01/21/2022)   Received from [facility]   Social Network    Social Network: Not on file  Intimate Partner Violence: Unknown (12/13/2021)   Received from [facility]   HITS    Physically Hurt: Not on file    Insult or Talk Down To: Not on file    Threaten Physical Harm: Not on file    Scream or Curse: Not on file    FAMILY HISTORY:  Family History  Problem Relation Age of Onset   Eczema Mother    Heart disease Mother    Heart attack Mother    Breast cancer Mother    Hypertension Father    Heart disease Father    Colon polyps Father    COPD Father    Kidney cancer Father    Asthma Sister    Allergic rhinitis Sister    Diabetes Sister    Diabetes Maternal Aunt        7 mat aunts diabetes   Hypertension Maternal Aunt    Lung disease Maternal Aunt    Hypertension Paternal Aunt    Breast cancer Paternal Aunt    Colon cancer Neg Hx     CURRENT MEDICATIONS:  Outpatient Encounter Medications as of 07/23/2024  Medication Sig   acetaminophen  (TYLENOL ) 500 MG tablet Take 1,000 mg by mouth 2 (two) times daily as needed for moderate pain.   albuterol  (PROVENTIL ) (2.5 MG/3ML) 0.083% nebulizer solution Take 3 mLs (2.5 mg total) by nebulization every 6 (six) hours as needed for wheezing or shortness of breath.   albuterol  (VENTOLIN  HFA) 108 (90 Base) MCG/ACT inhaler Inhale 2 puffs into the lungs every 4 (four) hours as needed for wheezing or shortness of breath.   ALPRAZolam  (XANAX ) 0.5 MG tablet Take 1 tablet (0.5 mg total) by mouth 3 (three) times daily as needed for sleep or anxiety.   apixaban  (ELIQUIS ) 5 MG  TABS tablet Take 1 tablet (5 mg total) by mouth 2 (two) times daily.   BREZTRI  AEROSPHERE 160-9-4.8 MCG/ACT AERO inhaler Inhale 2 puffs into the lungs in the morning and at bedtime.   cetirizine  (ZYRTEC ) 10 MG tablet Take 1 tablet (10 mg  total) by mouth daily.   diclofenac Sodium (VOLTAREN ARTHRITIS PAIN) 1 % GEL Apply 2 g topically 4 (four) times daily as needed.   Doxepin HCl 5 % CREA Apply 1 Application topically daily at 6 (six) AM.   escitalopram  (LEXAPRO ) 20 MG tablet Take 1 tablet by mouth once daily   FASENRA  PEN 30 MG/ML prefilled autoinjector INJECT 1 PEN SUBCUTANEOUSLY  EVERY 8 WEEKS   furosemide  (LASIX ) 20 MG tablet Take 20 mg by mouth daily.   loperamide (IMODIUM A-D) 2 MG tablet Take 2 mg by mouth daily as needed for diarrhea or loose stools.   loratadine  (CLARITIN ) 10 MG tablet Take 10 mg by mouth daily.   losartan  (COZAAR ) 50 MG tablet Take 1 tablet (50 mg total) by mouth daily.   meclizine  (ANTIVERT ) 25 MG tablet Take 1 tablet (25 mg total) by mouth 3 (three) times daily as needed for dizziness.   metFORMIN  (GLUCOPHAGE ) 1000 MG tablet Take 1 tablet (1,000 mg total) by mouth daily with breakfast.   MOUNJARO 2.5 MG/0.5ML Pen SMARTSIG:0.5 Milliliter(s) SUB-Q Once a Week   omeprazole  (PRILOSEC) 20 MG capsule Take 1 capsule (20 mg total) by mouth daily.   predniSONE  (DELTASONE ) 20 MG tablet Take 1 tablet (20 mg total) by mouth daily with breakfast.   Spacer/Aero-Holding Chambers (AEROCHAMBER PLUS FLO-VU LARGE) MISC Use as directed with metered dose inhaler.   Tetrahydrozoline HCl (VISINE OP) Place 1 drop into both eyes daily as needed (dry eyes).   Facility-Administered Encounter Medications as of 07/23/2024  Medication   Benralizumab  SOSY 30 mg    ALLERGIES:  Allergies  Allergen Reactions   Penicillins Anaphylaxis, Shortness Of Breath and Swelling    DID THE REACTION INVOLVE: Swelling of the face/tongue/throat, SOB, or low BP? Yes Sudden or severe rash/hives, skin peeling,  or the inside of the mouth or nose? No Did it require medical treatment? Yes When did it last happen?      15+ years If all above answers are "NO", may proceed with cephalosporin use.  PATIENT TOLERATED ZOSYN  AND MERREM  ON 07/21/17 ADMISSION    Floxin [Ofloxacin]     MIGRAINES, INSOMNIA, PARANOIND, RINGING IN EARS   Sulfa  Antibiotics Hives   Lipitor [Atorvastatin]     Severe myalgias   Penicillin G Benzathine Rash    LABORATORY DATA:  I have reviewed the labs as listed.  CBC    Component Value Date/Time   WBC 11.6 (H) 07/16/2024 0830   RBC 4.32 07/16/2024 0830   HGB 13.2 07/16/2024 0830   HGB 14.0 08/09/2021 1318   HCT 41.4 07/16/2024 0830   HCT 41.8 08/09/2021 1318   PLT 268 07/16/2024 0830   MCV 95.8 07/16/2024 0830   MCV 92 08/09/2021 1318   MCH 30.6 07/16/2024 0830   MCHC 31.9 07/16/2024 0830   RDW 13.2 07/16/2024 0830   RDW 11.7 08/09/2021 1318   LYMPHSABS 4.7 (H) 07/16/2024 0830   LYMPHSABS 3.3 (H) 08/09/2021 1318   MONOABS 1.2 (H) 07/16/2024 0830   EOSABS 0.0 07/16/2024 0830   EOSABS 0.6 (H) 08/09/2021 1318   BASOSABS 0.0 07/16/2024 0830   BASOSABS 0.1 08/09/2021 1318      Latest Ref Rng & Units 07/16/2024    8:30 AM 04/15/2024    7:50 AM 03/06/2023    4:38 AM  CMP  Glucose 70 - 99 mg/dL 868  89  93   BUN 6 - 20 mg/dL 18  8  9    Creatinine 0.44 - 1.00 mg/dL 9.32  0.66  0.82   Sodium 135 - 145 mmol/L 141  141  137   Potassium 3.5 - 5.1 mmol/L 4.1  3.8  4.6   Chloride 98 - 111 mmol/L 105  105  105   CO2 22 - 32 mmol/L 26  26  25    Calcium  8.9 - 10.3 mg/dL 8.9  9.3  8.2   Total Protein 6.5 - 8.1 g/dL 7.4  7.3    Total Bilirubin 0.0 - 1.2 mg/dL 0.3  0.7    Alkaline Phos 38 - 126 U/L 89  85    AST 15 - 41 U/L 21  25    ALT 0 - 44 U/L 15  14      DIAGNOSTIC IMAGING:  I have independently reviewed the relevant imaging and discussed with the patient.   WRAP UP:  All questions were answered. The patient knows to call the clinic with any problems, questions  or concerns.  Medical decision making: Moderate***  Time spent on visit: I spent 20 minutes counseling the patient face to face. The total time spent in the appointment was 30 minutes and more than 50% was on counseling.  Pleasant CHRISTELLA Barefoot, PA-C  ***

## 2024-07-23 ENCOUNTER — Inpatient Hospital Stay: Payer: 59 | Admitting: Physician Assistant

## 2024-07-24 ENCOUNTER — Ambulatory Visit (INDEPENDENT_AMBULATORY_CARE_PROVIDER_SITE_OTHER)

## 2024-07-24 DIAGNOSIS — J4550 Severe persistent asthma, uncomplicated: Secondary | ICD-10-CM | POA: Diagnosis not present

## 2024-07-31 ENCOUNTER — Ambulatory Visit (HOSPITAL_BASED_OUTPATIENT_CLINIC_OR_DEPARTMENT_OTHER)
Admission: RE | Admit: 2024-07-31 | Discharge: 2024-07-31 | Disposition: A | Discharge status: Home / Self Care | Source: Ambulatory Visit | Attending: *Deleted | Admitting: *Deleted

## 2024-07-31 ENCOUNTER — Other Ambulatory Visit (HOSPITAL_BASED_OUTPATIENT_CLINIC_OR_DEPARTMENT_OTHER): Payer: Self-pay | Admitting: *Deleted

## 2024-07-31 DIAGNOSIS — S8392XA Sprain of unspecified site of left knee, initial encounter: Secondary | ICD-10-CM | POA: Insufficient documentation

## 2024-07-31 DIAGNOSIS — R600 Localized edema: Secondary | ICD-10-CM | POA: Diagnosis present

## 2024-08-10 ENCOUNTER — Other Ambulatory Visit: Payer: Self-pay | Admitting: Physician Assistant

## 2024-08-10 DIAGNOSIS — I824Y2 Acute embolism and thrombosis of unspecified deep veins of left proximal lower extremity: Secondary | ICD-10-CM

## 2024-08-10 DIAGNOSIS — Z7901 Long term (current) use of anticoagulants: Secondary | ICD-10-CM

## 2024-09-09 ENCOUNTER — Encounter: Payer: Self-pay | Admitting: *Deleted

## 2024-09-18 ENCOUNTER — Ambulatory Visit

## 2024-09-18 DIAGNOSIS — J4550 Severe persistent asthma, uncomplicated: Secondary | ICD-10-CM | POA: Diagnosis not present

## 2024-10-04 ENCOUNTER — Inpatient Hospital Stay: Attending: Physician Assistant

## 2024-10-04 ENCOUNTER — Other Ambulatory Visit: Payer: Self-pay

## 2024-10-04 DIAGNOSIS — Z7901 Long term (current) use of anticoagulants: Secondary | ICD-10-CM

## 2024-10-04 DIAGNOSIS — I824Y2 Acute embolism and thrombosis of unspecified deep veins of left proximal lower extremity: Secondary | ICD-10-CM

## 2024-10-04 LAB — CBC WITH DIFFERENTIAL/PLATELET
Abs Immature Granulocytes: 0.05 K/uL (ref 0.00–0.07)
Basophils Absolute: 0 K/uL (ref 0.0–0.1)
Basophils Relative: 0 %
Eosinophils Absolute: 0 K/uL (ref 0.0–0.5)
Eosinophils Relative: 0 %
HCT: 42.3 % (ref 36.0–46.0)
Hemoglobin: 13.2 g/dL (ref 12.0–15.0)
Immature Granulocytes: 1 %
Lymphocytes Relative: 36 %
Lymphs Abs: 3.6 K/uL (ref 0.7–4.0)
MCH: 30.8 pg (ref 26.0–34.0)
MCHC: 31.2 g/dL (ref 30.0–36.0)
MCV: 98.6 fL (ref 80.0–100.0)
Monocytes Absolute: 1.2 K/uL — ABNORMAL HIGH (ref 0.1–1.0)
Monocytes Relative: 12 %
Neutro Abs: 5.2 K/uL (ref 1.7–7.7)
Neutrophils Relative %: 51 %
Platelets: 261 K/uL (ref 150–400)
RBC: 4.29 MIL/uL (ref 3.87–5.11)
RDW: 13.3 % (ref 11.5–15.5)
WBC: 10 K/uL (ref 4.0–10.5)
nRBC: 0 % (ref 0.0–0.2)

## 2024-10-04 LAB — COMPREHENSIVE METABOLIC PANEL WITH GFR
ALT: 14 U/L (ref 0–44)
AST: 21 U/L (ref 15–41)
Albumin: 3.9 g/dL (ref 3.5–5.0)
Alkaline Phosphatase: 82 U/L (ref 38–126)
Anion gap: 12 (ref 5–15)
BUN: 12 mg/dL (ref 8–23)
CO2: 25 mmol/L (ref 22–32)
Calcium: 9.1 mg/dL (ref 8.9–10.3)
Chloride: 106 mmol/L (ref 98–111)
Creatinine, Ser: 0.7 mg/dL (ref 0.44–1.00)
GFR, Estimated: >60 mL/min
Glucose, Bld: 118 mg/dL — ABNORMAL HIGH (ref 70–99)
Potassium: 4.4 mmol/L (ref 3.5–5.1)
Sodium: 144 mmol/L (ref 135–145)
Total Bilirubin: 0.6 mg/dL (ref 0.0–1.2)
Total Protein: 7.4 g/dL (ref 6.5–8.1)

## 2024-10-04 LAB — D-DIMER, QUANTITATIVE: D-Dimer, Quant: 1.26 ug{FEU}/mL — ABNORMAL HIGH (ref 0.00–0.50)

## 2024-10-07 ENCOUNTER — Inpatient Hospital Stay

## 2024-10-11 LAB — MISC LABCORP TEST (SEND OUT): Labcorp test code: 504385

## 2024-10-13 ENCOUNTER — Encounter: Payer: Self-pay | Admitting: *Deleted

## 2024-10-14 ENCOUNTER — Inpatient Hospital Stay: Admitting: Physician Assistant

## 2024-10-30 ENCOUNTER — Ambulatory Visit: Admitting: Allergy & Immunology

## 2024-11-13 ENCOUNTER — Ambulatory Visit

## 2024-12-10 ENCOUNTER — Inpatient Hospital Stay: Admitting: Physician Assistant

## 2025-01-10 ENCOUNTER — Ambulatory Visit: Admitting: Neurology
